# Patient Record
Sex: Female | Born: 1935 | Race: Black or African American | Hispanic: No | Smoking: Former smoker
Health system: Southern US, Community
[De-identification: ages and names within clinical notes are randomized; demographics above are authoritative.]

## PROBLEM LIST (undated history)

## (undated) DIAGNOSIS — T7840XA Allergy, unspecified, initial encounter: Secondary | ICD-10-CM

## (undated) DIAGNOSIS — R001 Bradycardia, unspecified: Secondary | ICD-10-CM

## (undated) DIAGNOSIS — R55 Syncope and collapse: Secondary | ICD-10-CM

## (undated) DIAGNOSIS — N183 Chronic kidney disease, stage 3 unspecified: Secondary | ICD-10-CM

## (undated) DIAGNOSIS — I671 Cerebral aneurysm, nonruptured: Secondary | ICD-10-CM

## (undated) DIAGNOSIS — M329 Systemic lupus erythematosus, unspecified: Secondary | ICD-10-CM

## (undated) DIAGNOSIS — F17200 Nicotine dependence, unspecified, uncomplicated: Secondary | ICD-10-CM

## (undated) DIAGNOSIS — I129 Hypertensive chronic kidney disease with stage 1 through stage 4 chronic kidney disease, or unspecified chronic kidney disease: Secondary | ICD-10-CM

## (undated) DIAGNOSIS — M1990 Unspecified osteoarthritis, unspecified site: Secondary | ICD-10-CM

## (undated) DIAGNOSIS — M19049 Primary osteoarthritis, unspecified hand: Secondary | ICD-10-CM

## (undated) DIAGNOSIS — H9190 Unspecified hearing loss, unspecified ear: Secondary | ICD-10-CM

## (undated) DIAGNOSIS — I10 Essential (primary) hypertension: Secondary | ICD-10-CM

## (undated) DIAGNOSIS — E538 Deficiency of other specified B group vitamins: Secondary | ICD-10-CM

## (undated) DIAGNOSIS — E039 Hypothyroidism, unspecified: Secondary | ICD-10-CM

## (undated) DIAGNOSIS — F0390 Unspecified dementia without behavioral disturbance: Secondary | ICD-10-CM

## (undated) DIAGNOSIS — G250 Essential tremor: Secondary | ICD-10-CM

## (undated) DIAGNOSIS — I639 Cerebral infarction, unspecified: Secondary | ICD-10-CM

## (undated) DIAGNOSIS — R809 Proteinuria, unspecified: Secondary | ICD-10-CM

## (undated) DIAGNOSIS — I472 Ventricular tachycardia: Secondary | ICD-10-CM

## (undated) DIAGNOSIS — I4729 Other ventricular tachycardia: Secondary | ICD-10-CM

## (undated) DIAGNOSIS — IMO0002 Reserved for concepts with insufficient information to code with codable children: Secondary | ICD-10-CM

## (undated) DIAGNOSIS — I2510 Atherosclerotic heart disease of native coronary artery without angina pectoris: Secondary | ICD-10-CM

## (undated) DIAGNOSIS — I4721 Torsades de pointes: Secondary | ICD-10-CM

## (undated) DIAGNOSIS — E785 Hyperlipidemia, unspecified: Secondary | ICD-10-CM

## (undated) DIAGNOSIS — N318 Other neuromuscular dysfunction of bladder: Secondary | ICD-10-CM

## (undated) DIAGNOSIS — Z8679 Personal history of other diseases of the circulatory system: Secondary | ICD-10-CM

## (undated) DIAGNOSIS — M8580 Other specified disorders of bone density and structure, unspecified site: Secondary | ICD-10-CM

## (undated) DIAGNOSIS — M19019 Primary osteoarthritis, unspecified shoulder: Secondary | ICD-10-CM

## (undated) HISTORY — DX: Systemic lupus erythematosus, unspecified: M32.9

## (undated) HISTORY — DX: Bradycardia, unspecified: R00.1

## (undated) HISTORY — DX: Hypothyroidism, unspecified: E03.9

## (undated) HISTORY — DX: Personal history of other diseases of the circulatory system: Z86.79

## (undated) HISTORY — DX: Proteinuria, unspecified: R80.9

## (undated) HISTORY — DX: Primary osteoarthritis, unspecified hand: M19.049

## (undated) HISTORY — DX: Allergy, unspecified, initial encounter: T78.40XA

## (undated) HISTORY — DX: Deficiency of other specified B group vitamins: E53.8

## (undated) HISTORY — DX: Unspecified dementia, unspecified severity, without behavioral disturbance, psychotic disturbance, mood disturbance, and anxiety: F03.90

## (undated) HISTORY — DX: Nicotine dependence, unspecified, uncomplicated: F17.200

## (undated) HISTORY — DX: Other neuromuscular dysfunction of bladder: N31.8

## (undated) HISTORY — DX: Chronic kidney disease, stage 3 (moderate): N18.3

## (undated) HISTORY — DX: Essential (primary) hypertension: I10

## (undated) HISTORY — DX: Other ventricular tachycardia: I47.29

## (undated) HISTORY — DX: Cerebral infarction, unspecified: I63.9

## (undated) HISTORY — DX: Syncope and collapse: R55

## (undated) HISTORY — DX: Essential tremor: G25.0

## (undated) HISTORY — PX: OTHER SURGICAL HISTORY: SHX169

## (undated) HISTORY — DX: Primary osteoarthritis, unspecified shoulder: M19.019

## (undated) HISTORY — DX: Ventricular tachycardia: I47.2

## (undated) HISTORY — DX: Chronic kidney disease, stage 3 unspecified: N18.30

## (undated) HISTORY — DX: Torsades de pointes: I47.21

## (undated) HISTORY — DX: Other specified disorders of bone density and structure, unspecified site: M85.80

## (undated) HISTORY — DX: Unspecified osteoarthritis, unspecified site: M19.90

## (undated) HISTORY — DX: Atherosclerotic heart disease of native coronary artery without angina pectoris: I25.10

## (undated) HISTORY — DX: Reserved for concepts with insufficient information to code with codable children: IMO0002

## (undated) HISTORY — DX: Hyperlipidemia, unspecified: E78.5

## (undated) HISTORY — DX: Unspecified hearing loss, unspecified ear: H91.90

## (undated) HISTORY — DX: Cerebral aneurysm, nonruptured: I67.1

## (undated) HISTORY — DX: Hypertensive chronic kidney disease with stage 1 through stage 4 chronic kidney disease, or unspecified chronic kidney disease: I12.9

---

## 1999-06-10 ENCOUNTER — Emergency Department (HOSPITAL_COMMUNITY): Admission: EM | Admit: 1999-06-10 | Discharge: 1999-06-10 | Payer: Self-pay | Admitting: Emergency Medicine

## 1999-06-24 ENCOUNTER — Ambulatory Visit (HOSPITAL_COMMUNITY): Admission: RE | Admit: 1999-06-24 | Discharge: 1999-06-24 | Payer: Self-pay | Admitting: Internal Medicine

## 1999-06-24 ENCOUNTER — Encounter: Payer: Self-pay | Admitting: Internal Medicine

## 1999-10-22 ENCOUNTER — Encounter: Payer: Self-pay | Admitting: Internal Medicine

## 1999-10-22 ENCOUNTER — Ambulatory Visit (HOSPITAL_COMMUNITY): Admission: RE | Admit: 1999-10-22 | Discharge: 1999-10-22 | Payer: Self-pay | Admitting: Internal Medicine

## 2004-06-05 ENCOUNTER — Ambulatory Visit: Payer: Self-pay | Admitting: *Deleted

## 2004-08-25 ENCOUNTER — Ambulatory Visit: Payer: Self-pay | Admitting: Nurse Practitioner

## 2005-07-30 DIAGNOSIS — N318 Other neuromuscular dysfunction of bladder: Secondary | ICD-10-CM | POA: Insufficient documentation

## 2006-04-12 ENCOUNTER — Encounter: Payer: Self-pay | Admitting: Internal Medicine

## 2006-04-12 LAB — CONVERTED CEMR LAB: TSH: 3.756 microintl units/mL

## 2007-03-16 ENCOUNTER — Encounter: Payer: Self-pay | Admitting: Internal Medicine

## 2007-03-16 LAB — CONVERTED CEMR LAB
BUN: 19 mg/dL
Chloride: 105 meq/L
Creatinine, Ser: 1.48 mg/dL
Hemoglobin: 11.7 g/dL
Potassium: 3.9 meq/L
Sodium: 142 meq/L
TSH: 67.068 microintl units/mL
WBC: 5.2 10*3/uL

## 2007-04-19 DIAGNOSIS — H9190 Unspecified hearing loss, unspecified ear: Secondary | ICD-10-CM

## 2007-12-19 ENCOUNTER — Encounter: Payer: Self-pay | Admitting: Internal Medicine

## 2007-12-19 LAB — CONVERTED CEMR LAB
Alkaline Phosphatase: 70 units/L
CO2: 21 meq/L
Chloride: 105 meq/L
Creatinine, Ser: 1.12 mg/dL
HCT: 39.7 %
Hemoglobin: 13 g/dL
RDW: 14.9 %
Sodium: 140 meq/L
TSH: 0.085 microintl units/mL
WBC: 5.4 10*3/uL

## 2008-01-20 DIAGNOSIS — M19049 Primary osteoarthritis, unspecified hand: Secondary | ICD-10-CM | POA: Insufficient documentation

## 2008-05-01 ENCOUNTER — Encounter: Payer: Self-pay | Admitting: Internal Medicine

## 2008-05-01 DIAGNOSIS — R259 Unspecified abnormal involuntary movements: Secondary | ICD-10-CM | POA: Insufficient documentation

## 2008-05-01 LAB — CONVERTED CEMR LAB: TSH: 0.041 microintl units/mL

## 2008-10-12 ENCOUNTER — Encounter: Payer: Self-pay | Admitting: Internal Medicine

## 2008-10-12 LAB — CONVERTED CEMR LAB
AST: 18 units/L
Alkaline Phosphatase: 77 units/L
Calcium: 9.8 mg/dL
Glucose, Bld: 79 mg/dL
MCV: 95 fL
Platelets: 291 10*3/uL
RDW: 14.9 %
Sodium: 140 meq/L
TSH: 0.426 microintl units/mL
WBC: 5.6 10*3/uL

## 2009-03-22 ENCOUNTER — Emergency Department (HOSPITAL_COMMUNITY): Admission: EM | Admit: 2009-03-22 | Discharge: 2009-03-22 | Payer: Self-pay | Admitting: Family Medicine

## 2009-08-05 ENCOUNTER — Ambulatory Visit: Payer: Self-pay | Admitting: Internal Medicine

## 2009-08-05 DIAGNOSIS — R0602 Shortness of breath: Secondary | ICD-10-CM | POA: Insufficient documentation

## 2009-08-05 DIAGNOSIS — I10 Essential (primary) hypertension: Secondary | ICD-10-CM

## 2009-08-05 DIAGNOSIS — E039 Hypothyroidism, unspecified: Secondary | ICD-10-CM

## 2009-08-05 DIAGNOSIS — M19019 Primary osteoarthritis, unspecified shoulder: Secondary | ICD-10-CM | POA: Insufficient documentation

## 2009-08-05 DIAGNOSIS — Z8679 Personal history of other diseases of the circulatory system: Secondary | ICD-10-CM | POA: Insufficient documentation

## 2009-08-05 DIAGNOSIS — F17200 Nicotine dependence, unspecified, uncomplicated: Secondary | ICD-10-CM | POA: Insufficient documentation

## 2009-08-07 ENCOUNTER — Encounter: Admission: RE | Admit: 2009-08-07 | Discharge: 2009-08-07 | Payer: Self-pay | Admitting: Internal Medicine

## 2009-08-07 LAB — CONVERTED CEMR LAB
BUN: 16 mg/dL (ref 6–23)
Basophils Absolute: 0 10*3/uL (ref 0.0–0.1)
Basophils Relative: 0.3 % (ref 0.0–3.0)
CO2: 26 meq/L (ref 19–32)
Calcium: 9.8 mg/dL (ref 8.4–10.5)
Chloride: 105 meq/L (ref 96–112)
Cholesterol: 267 mg/dL — ABNORMAL HIGH (ref 0–200)
Creatinine, Ser: 1.7 mg/dL — ABNORMAL HIGH (ref 0.4–1.2)
Direct LDL: 178.7 mg/dL
Eosinophils Absolute: 0.1 10*3/uL (ref 0.0–0.7)
Eosinophils Relative: 2.4 % (ref 0.0–5.0)
GFR calc non Af Amer: 38.08 mL/min (ref >60)
Glucose, Bld: 90 mg/dL (ref 70–99)
HCT: 36.1 % (ref 36.0–46.0)
HDL: 50.7 mg/dL (ref >39.00)
Hemoglobin: 12.3 g/dL (ref 12.0–15.0)
Lymphocytes Relative: 30.1 % (ref 12.0–46.0)
Lymphs Abs: 1.7 10*3/uL (ref 0.7–4.0)
MCHC: 34.1 g/dL (ref 30.0–36.0)
MCV: 100.4 fL — ABNORMAL HIGH (ref 78.0–100.0)
Monocytes Absolute: 0.4 10*3/uL (ref 0.1–1.0)
Monocytes Relative: 6.3 % (ref 3.0–12.0)
Neutro Abs: 3.4 10*3/uL (ref 1.4–7.7)
Neutrophils Relative %: 60.9 % (ref 43.0–77.0)
Platelets: 252 10*3/uL (ref 150.0–400.0)
Potassium: 5.1 meq/L (ref 3.5–5.1)
RBC: 3.6 M/uL — ABNORMAL LOW (ref 3.87–5.11)
RDW: 14.9 % — ABNORMAL HIGH (ref 11.5–14.6)
Sodium: 141 meq/L (ref 135–145)
TSH: 48.81 microintl units/mL — ABNORMAL HIGH (ref 0.35–5.50)
Total CHOL/HDL Ratio: 5
Triglycerides: 128 mg/dL (ref 0.0–149.0)
VLDL: 25.6 mg/dL (ref 0.0–40.0)
WBC: 5.6 10*3/uL (ref 4.5–10.5)

## 2009-08-13 ENCOUNTER — Encounter: Payer: Self-pay | Admitting: Internal Medicine

## 2009-08-16 ENCOUNTER — Encounter (INDEPENDENT_AMBULATORY_CARE_PROVIDER_SITE_OTHER): Payer: Self-pay | Admitting: *Deleted

## 2009-08-16 ENCOUNTER — Encounter: Admission: RE | Admit: 2009-08-16 | Discharge: 2009-08-16 | Payer: Self-pay | Admitting: Internal Medicine

## 2009-08-26 ENCOUNTER — Encounter: Payer: Self-pay | Admitting: Internal Medicine

## 2009-08-26 DIAGNOSIS — M25569 Pain in unspecified knee: Secondary | ICD-10-CM

## 2009-08-26 DIAGNOSIS — E785 Hyperlipidemia, unspecified: Secondary | ICD-10-CM

## 2009-09-10 ENCOUNTER — Ambulatory Visit: Payer: Self-pay | Admitting: Internal Medicine

## 2009-09-11 LAB — CONVERTED CEMR LAB
ALT: 12 units/L (ref 0–35)
AST: 19 units/L (ref 0–37)
Alkaline Phosphatase: 70 units/L (ref 39–117)
Bilirubin, Direct: 0.1 mg/dL (ref 0.0–0.3)
Cholesterol: 189 mg/dL (ref 0–200)
Total Protein: 8.3 g/dL (ref 6.0–8.3)

## 2010-01-27 ENCOUNTER — Encounter: Payer: Self-pay | Admitting: Internal Medicine

## 2010-02-25 ENCOUNTER — Ambulatory Visit: Payer: Self-pay | Admitting: Internal Medicine

## 2010-02-25 DIAGNOSIS — L408 Other psoriasis: Secondary | ICD-10-CM | POA: Insufficient documentation

## 2010-02-25 LAB — CONVERTED CEMR LAB
Creatinine, Ser: 1.4 mg/dL — ABNORMAL HIGH (ref 0.4–1.2)
LDL Cholesterol: 105 mg/dL — ABNORMAL HIGH (ref 0–99)
Total CHOL/HDL Ratio: 5

## 2010-05-19 ENCOUNTER — Telehealth: Payer: Self-pay | Admitting: Internal Medicine

## 2010-11-04 NOTE — Assessment & Plan Note (Signed)
Summary: 3-6 mth fu  stc   Vital Signs:  Patient profile:   75 year old female Height:      65 inches (165.10 cm) Weight:      191.0 pounds (86.82 kg) O2 Sat:      96 % on Room air Temp:     98.1 degrees F (36.72 degrees C) oral Pulse rate:   70 / minute BP sitting:   142 / 68  (left arm) Cuff size:   regular  Vitals Entered By: Orlan Leavens (Feb 25, 2010 1:16 PM)  O2 Flow:  Room air CC: 6 month follow-up Is Patient Diabetic? No Pain Assessment Patient in pain? no        Primary Care Provider:  Newt Lukes MD  CC:  6 month follow-up.  History of Present Illness: here for followup -  1) dyslipidemia -  now on statin since fall 2010 no adv SE - reports 100% compiance with meds - dtr reports poor pt compliance with diet "eats only friend chicken!"  2) hypothyroid - dose med inc 6 weeks ago - denies adverse side effects related to current therapy.   3) arthiritis - reports compliance with ongoing medical treatment and no changes in medication dose or frequency. denies adverse side effects related to current therapy.   4) HTN -  reports compliance with ongoing medical treatment and no changes in medication dose or frequency. denies adverse side effects related to current therapy. no CP, edema or SOB  Clinical Review Panels:  Lipid Management   Cholesterol:  189 (09/10/2009)   LDL (bad choesterol):  135 (09/10/2009)   HDL (good cholesterol):  29.10 (09/10/2009)  CBC   WBC:  5.6 (08/05/2009)   RBC:  3.60 (08/05/2009)   Hgb:  12.3 (08/05/2009)   Hct:  36.1 (08/05/2009)   Platelets:  252.0 (08/05/2009)   MCV  100.4 (08/05/2009)   MCHC  34.1 (08/05/2009)   RDW  14.9 (08/05/2009)   PMN:  60.9 (08/05/2009)   Lymphs:  30.1 (08/05/2009)   Monos:  6.3 (08/05/2009)   Eosinophils:  2.4 (08/05/2009)   Basophil:  0.3 (08/05/2009)  Complete Metabolic Panel   Glucose:  90 (08/05/2009)   Sodium:  141 (08/05/2009)   Potassium:  5.1 (08/05/2009)   Chloride:   105 (08/05/2009)   CO2:  26 (08/05/2009)   BUN:  16 (08/05/2009)   Creatinine:  1.7 (08/05/2009)   Albumin:  3.7 (09/10/2009)   Total Protein:  8.3 (09/10/2009)   Calcium:  9.8 (08/05/2009)   Total Bili:  0.6 (09/10/2009)   Alk Phos:  70 (09/10/2009)   SGPT (ALT):  12 (09/10/2009)   SGOT (AST):  19 (09/10/2009)   Current Medications (verified): 1)  Levothyroxine Sodium 125 Mcg Tabs (Levothyroxine Sodium) .Marland Kitchen.. 1 By Mouth Once Daily 2)  Tizanidine Hcl 2 Mg Tabs (Tizanidine Hcl) .... Take 1 Three Times A Day Prn 3)  Benicar Hct 40-12.5 Mg Tabs (Olmesartan Medoxomil-Hctz) .... Take 1 By Mouth Qd 4)  Meloxicam 7.5 Mg Tabs (Meloxicam) .Marland Kitchen.. 1 By Mouth Once Daily As Needed For Shoulder Arthritis Pain 5)  Zocor 20 Mg Tabs (Simvastatin) .Marland Kitchen.. 1 By Mouth At Bedtime  Allergies (verified): No Known Drug Allergies  Past History:  Past Medical History: Hypertension Hypothyroidism Cerebrovascular accident, hx of osteoarthritis Hyperlipidemia  Family History: Family History of Arthritis (parents) Family History Hypertension (parent & other blood relative) Family History Kidney disease (parent) Heart disease (parent) Gout (father) 7 sibling with hypertension,diabetes, and thyroid disease  Social History: Current Smoker lives with dtr, widowed works at [employer] and [employer] ([employer]) x 26y no alcohol use    Review of Systems  The patient denies anorexia, weight loss, chest pain, syncope, and dyspnea on exertion.         c/o trouble with shaking R hand with writing - hx same prev elav by neuro "nothing wrong" but its getting worse.  Physical Exam  General:  alert, well-developed, well-nourished, and cooperative to examination.   - dtr at side Lungs:  normal respiratory effort, no intercostal retractions or use of accessory muscles; normal breath sounds bilaterally - no crackles and no wheezes.    Heart:  normal rate, regular rhythm, no murmur, and no rub. BLE without  edema. Skin:  thickend scalp macules - no acute inflammation -= (?hx skin lupus per dtr) Psych:  Oriented X3, memory intact for recent and remote, normally interactive, good eye contact, not anxious appearing, not depressed appearing, and not agitated.      Impression & Recommendations:  Problem # 1:  TREMOR (ICD-781.0) hx same R hand - dtr will let us know where we can get records on the prior w/u and eval oif this -  no abn on exam today  Problem # 2:  HYPERLIPIDEMIA (ICD-272.4)  Her updated medication list for this problem includes:    Zocor 20 Mg Tabs (Simvastatin) .Marland Kitchen... 1 by mouth at bedtime  Labs Reviewed: SGOT: 19 (09/10/2009)   SGPT: 12 (09/10/2009)   HDL:29.10 (09/10/2009), 50.70 (08/05/2009)  LDL:135 (09/10/2009)  Chol:189 (09/10/2009), 267 (08/05/2009)  Trig:127.0 (09/10/2009), 128.0 (08/05/2009)  Orders: TLB-Lipid Panel (80061-LIPID)  Problem # 3:  HYPOTHYROIDISM (ICD-244.9)  Her updated medication list for this problem includes:    Levothyroxine Sodium 125 Mcg Tabs (Levothyroxine sodium) .Marland Kitchen... 1 by mouth once daily  Labs Reviewed: TSH: 0.91 (09/10/2009)    Chol: 189 (09/10/2009)   HDL: 29.10 (09/10/2009)   LDL: 135 (09/10/2009)   TG: 127.0 (09/10/2009)  Orders: TLB-TSH (Thyroid Stimulating Hormone) (84443-TSH)  Problem # 4:  HYPERTENSION (ICD-401.9)  Her updated medication list for this problem includes:    Benicar Hct 40-12.5 Mg Tabs (Olmesartan medoxomil-hctz) .Marland Kitchen... Take 1 by mouth qd  BP today: 142/68 Prior BP: 130/82 (09/10/2009)  Labs Reviewed: K+: 5.1 (08/05/2009) Creat: : 1.7 (08/05/2009)   Chol: 189 (09/10/2009)   HDL: 29.10 (09/10/2009)   LDL: 135 (09/10/2009)   TG: 127.0 (09/10/2009)  Orders: TLB-Creatinine, Blood (82565-CREA)  Problem # 5:  OTHER PSORIASIS AND SIMILAR DISORDERS (ICD-696.1)  ?lupus vs. psoriasis -  will refer to derm for eval - requests drew jones  Orders: Dermatology Referral (Derma)  Complete Medication List: 1)   Levothyroxine Sodium 125 Mcg Tabs (Levothyroxine sodium) .Marland Kitchen.. 1 by mouth once daily 2)  Tizanidine Hcl 2 Mg Tabs (Tizanidine hcl) .... Take 1 three times a day prn 3)  Benicar Hct 40-12.5 Mg Tabs (Olmesartan medoxomil-hctz) .... Take 1 by mouth qd 4)  Meloxicam 7.5 Mg Tabs (Meloxicam) .Marland Kitchen.. 1 by mouth once daily as needed for shoulder arthritis pain 5)  Zocor 20 Mg Tabs (Simvastatin) .Marland Kitchen.. 1 by mouth at bedtime  Patient Instructions: 1)  it was good to see you today.  2)  test(s) ordered today - your results will be posted on the phone tree for review in 48-72 hours from the time of test completion; if any changes need to be made or there are abnormal results, you will be contacted directly.  3)  otherwise, no medication changes - keep  doing what you are doing! refills done today 4)  we'll make referral to Dermatology (dr. drew jones requested) for your scalp rash. Our office will contact you regarding this appointment once made.  5)  Please schedule a follow-up appointment in 3-6 months, sooner if problems.  Prescriptions: ZOCOR 20 MG TABS (SIMVASTATIN) 1 by mouth at bedtime  #30 x 5   Entered by:   Orlan Leavens   Authorized by:   Newt Lukes MD   Signed by:   Orlan Leavens on 02/25/2010   Method used:   [facility]        [facility] [address] [phone]* (retail)       [address]       [place], [place]  [address]       Ph: [phone] or [phone]       Fax: [phone]   RxID:   [id number] MELOXICAM 7.5 MG TABS (MELOXICAM) 1 by mouth once daily as needed for shoulder arthritis pain  #30 Tablet x 1   Entered by:   Orlan Leavens   Authorized by:   Newt Lukes MD   Signed by:   Orlan Leavens on 02/25/2010   Method used:   [facility]        [facility] [address] [phone]* (retail)       [address]       [place], [place]  [address]       Ph: [phone] or [phone]       Fax: [phone]   RxID:    [id number] LEVOTHYROXINE SODIUM 125 MCG TABS (LEVOTHYROXINE SODIUM) 1 by mouth once daily  #30 Tablet x 5   Entered by:   Orlan Leavens   Authorized by:   Newt Lukes MD   Signed by:   Orlan Leavens on 02/25/2010   Method used:   [facility] to        [facility] [address] [phone]* (retail)       [address]       [place], [place]  [address]       Ph: [phone] or [phone]       Fax: [phone]   RxID:   [id number]

## 2010-11-04 NOTE — Therapy (Signed)
Summary: [facility]   [facility]   Imported By: Lester  02/03/2010 10:46:42  _____________________________________________________________________  External Attachment:    Type:   Image     Comment:   External Document

## 2010-11-04 NOTE — Progress Notes (Signed)
Summary: benicar  Phone Note Refill Request Message from:  Fax from Pharmacy on May 19, 2010 10:11 AM  Refills Requested: Medication #1:  BENICAR HCT 40-12.5 MG TABS take 1 by mouth qd   [facility]   Method Requested: Electronic Initial call taken by: Orlan Leavens RMA,  May 19, 2010 10:11 AM    Prescriptions: BENICAR HCT 40-12.5 MG TABS (OLMESARTAN MEDOXOMIL-HCTZ) take 1 by mouth qd  #30 x 4   Entered by:   Orlan Leavens RMA   Authorized by:   Newt Lukes MD   Signed by:   Orlan Leavens RMA on 05/19/2010   Method used:   [facility]ronically to        [facility] [address] [phone]* (retail)       [address]       [place], [place]  [address]       Ph: [phone] or [phone]       Fax: [phone]   RxID:   [id number]

## 2010-12-17 ENCOUNTER — Telehealth: Payer: Self-pay | Admitting: Internal Medicine

## 2010-12-23 NOTE — Progress Notes (Signed)
Summary: benicar  Phone Note Refill Request Message from:  Fax from Pharmacy on December 17, 2010 1:04 PM  Refills Requested: Medication #1:  BENICAR HCT 40-12.5 MG TABS take 1 by mouth qd   Last Refilled: 10/24/2010  Method Requested: Electronic Initial call taken by: Orlan Leavens RMA,  December 17, 2010 1:04 PM    Prescriptions: BENICAR HCT 40-12.5 MG TABS (OLMESARTAN MEDOXOMIL-HCTZ) take 1 by mouth qd  #30 x 2   Entered by:   Orlan Leavens RMA   Authorized by:   Newt Lukes MD   Signed by:   Orlan Leavens RMA on 12/17/2010   Method used:   [facility]ronically to        [facility] [address] [phone]* (retail)       [address]       [place], [place]  [address]       Ph: [phone] or [phone]       Fax: [phone]   RxID:   [id number]

## 2011-04-01 ENCOUNTER — Other Ambulatory Visit: Payer: Self-pay | Admitting: Internal Medicine

## 2011-04-14 ENCOUNTER — Encounter: Payer: Self-pay | Admitting: Internal Medicine

## 2011-04-23 ENCOUNTER — Encounter: Payer: Self-pay | Admitting: Internal Medicine

## 2011-04-23 ENCOUNTER — Other Ambulatory Visit (INDEPENDENT_AMBULATORY_CARE_PROVIDER_SITE_OTHER): Payer: Medicare Other

## 2011-04-23 ENCOUNTER — Other Ambulatory Visit: Payer: Self-pay | Admitting: Internal Medicine

## 2011-04-23 ENCOUNTER — Ambulatory Visit (INDEPENDENT_AMBULATORY_CARE_PROVIDER_SITE_OTHER): Payer: Medicare Other | Admitting: Internal Medicine

## 2011-04-23 DIAGNOSIS — E785 Hyperlipidemia, unspecified: Secondary | ICD-10-CM

## 2011-04-23 DIAGNOSIS — E039 Hypothyroidism, unspecified: Secondary | ICD-10-CM

## 2011-04-23 DIAGNOSIS — Z23 Encounter for immunization: Secondary | ICD-10-CM

## 2011-04-23 DIAGNOSIS — I10 Essential (primary) hypertension: Secondary | ICD-10-CM

## 2011-04-23 DIAGNOSIS — Z79899 Other long term (current) drug therapy: Secondary | ICD-10-CM

## 2011-04-23 DIAGNOSIS — L408 Other psoriasis: Secondary | ICD-10-CM

## 2011-04-23 LAB — HEPATIC FUNCTION PANEL
ALT: 10 U/L (ref 0–35)
AST: 25 U/L (ref 0–37)
Albumin: 4 g/dL (ref 3.5–5.2)

## 2011-04-23 LAB — LIPID PANEL
Cholesterol: 208 mg/dL — ABNORMAL HIGH (ref 0–200)
Total CHOL/HDL Ratio: 4
Triglycerides: 76 mg/dL (ref 0.0–149.0)

## 2011-04-23 LAB — TSH: TSH: 29.47 u[IU]/mL — ABNORMAL HIGH (ref 0.35–5.50)

## 2011-04-23 LAB — BASIC METABOLIC PANEL
Calcium: 9.7 mg/dL (ref 8.4–10.5)
GFR: 41.24 mL/min — ABNORMAL LOW (ref >60.00)
Sodium: 144 mEq/L (ref 135–145)

## 2011-04-23 LAB — LDL CHOLESTEROL, DIRECT: Direct LDL: 138.6 mg/dL

## 2011-04-23 MED ORDER — TRIAMCINOLONE ACETONIDE 0.1 % EX LOTN: TOPICAL_LOTION | CUTANEOUS | Status: DC

## 2011-04-23 MED ORDER — PROPRANOLOL HCL ER 80 MG PO CP24: 80.0000 mg | ORAL_CAPSULE | Freq: Every day | ORAL | Status: DC

## 2011-04-23 MED ORDER — PNEUMOCOCCAL VAC POLYVALENT 25 MCG/0.5ML IJ INJ: 0.5000 mL | INJECTION | Freq: Once | INTRAMUSCULAR | Status: DC

## 2011-04-23 NOTE — Assessment & Plan Note (Signed)
S/p derm eval for same in past - rx steroid lotion to use prn

## 2011-04-23 NOTE — Assessment & Plan Note (Signed)
On statin - check LFT/lipids now

## 2011-04-23 NOTE — Patient Instructions (Addendum)
It was good to see you today. We have reviewed your prior records including labs and tests today Test(s) ordered today. Your results will be called to you after review (48-72hours after test completion). If any changes need to be made, you will be notified at that time. Start Inderal in addition to other medications to help control blood pressure and tremor symptoms  - no other changes at this time Your prescription(s) have been submitted to your pharmacy. Please take as directed and contact our office if you believe you are having problem(s) with the medication(s). Please schedule followup in 6 months to monitor thyroid and blood pressure, call sooner if problems. Pneumonia shot done today

## 2011-04-23 NOTE — Assessment & Plan Note (Signed)
BP Readings from Last 3 Encounters:  04/23/11 140/90  02/25/10 142/68  09/10/09 130/82   Add Bbloc to current therapy for BP control and RUE tremor symptoms  Check labs to monitor Cr

## 2011-04-23 NOTE — Progress Notes (Signed)
  Subjective:    Patient ID: Erika Rivera, female    DOB: 01-06-1936, 74 y.o.   MRN: [id number]  HPI here for followup - reviewed chronic medical issues:  dyslipidemia - on statin since fall 2010  no adv SE - reports 100% compiance with meds -  dtr reports poor pt compliance with diet  hypothyroid - last dose change 01/2011 -  denies adverse side effects related to current therapy.   arthiritis - reports compliance with ongoing medical treatment and no changes in medication dose or frequency.  denies adverse side effects related to current therapy.   HTN - reports compliance with ongoing medical treatment and no changes in medication dose or frequency.  denies adverse side effects related to current therapy. no CP, edema or SOB   Past Medical History  Diagnosis Date  . ARTHRITIS, HAND   . ARTHRITIS, LEFT SHOULDER   . HEARING LOSS   . SMOKER   . CEREBROVASCULAR ACCIDENT, HX OF   . HYPERTENSION   . HYPERLIPIDEMIA   . HYPOTHYROIDISM   . OVERACTIVE BLADDER     Review of Systems  Respiratory: Negative for shortness of breath.   Cardiovascular: Negative for chest pain.  Neurological: Negative for headaches.  increase RUE tremor     Objective:   Physical Exam BP 140/90  Pulse 65  Temp(Src) 97.9 F (36.6 C) (Oral)  Ht 5\' 5"  (1.651 m)  Wt 180 lb (81.647 kg)  BMI 29.95 kg/m2  SpO2 99% Wt Readings from Last 3 Encounters:  04/23/11 180 lb (81.647 kg)  02/25/10 191 lb (86.637 kg)  09/10/09 195 lb 6.4 oz (88.633 kg)    Physical Exam  Constitutional: She is oriented to person, place, and time. She appears well-developed and well-nourished. No distress. dtr at side Neck: Normal range of motion. Neck supple. No JVD present. No thyromegaly present.  Cardiovascular: Normal rate, regular rhythm and normal heart sounds.  No murmur heard. No BLE edema. Pulmonary/Chest: Effort normal and breath sounds normal. No respiratory distress. She has no wheezes.  Psychiatric: She has a  normal mood and affect. Her behavior is normal. Judgment and thought content normal.  Neuro: mild RUE intention tremor Skin - small psoriasis like patch along back at bra line  Lab Results  Component Value Date   WBC 5.6 08/05/2009   HGB 12.3 08/05/2009   HCT 36.1 08/05/2009   PLT 252.0 08/05/2009   CHOL 181 02/25/2010   TRIG 194.0* 02/25/2010   HDL 37.70* 02/25/2010   LDLDIRECT 178.7 08/05/2009   ALT 12 09/10/2009   AST 19 09/10/2009   NA 141 08/05/2009   K 5.1 08/05/2009   CL 105 08/05/2009   CREATININE 1.4* 02/25/2010   BUN 16 08/05/2009   CO2 26 08/05/2009   TSH 0.12* 02/25/2010        Assessment & Plan:  See problem list. Medications and labs reviewed today.

## 2011-04-23 NOTE — Assessment & Plan Note (Signed)
Last dose increase 01/2011 - now increase tremor and mild weight loss Check tsh now - adjust as needed

## 2011-04-24 ENCOUNTER — Other Ambulatory Visit: Payer: Self-pay | Admitting: *Deleted

## 2011-04-24 MED ORDER — LEVOTHYROXINE SODIUM 112 MCG PO TABS: 112.0000 ug | ORAL_TABLET | Freq: Every day | ORAL | Status: DC

## 2011-04-24 NOTE — Telephone Encounter (Signed)
Notified pt daughter Cherly Hensen) gave response with increase synthroid. rx sent to pharmacy...04/24/11@1 :54pm/LMB

## 2011-04-24 NOTE — Telephone Encounter (Signed)
Ok - yes, needs to increase dose synthroid - new erx done for tabs - take 1 daily - thanks

## 2011-04-24 NOTE — Telephone Encounter (Signed)
Called pt concerning her labs results. Daughter states mom was taking thyroid med everyday. Want to know if md want to change dosage on her med since it was abnormal..Marland Kitchen7/20/12@10 :24am/LMB

## 2011-05-01 ENCOUNTER — Other Ambulatory Visit: Payer: Self-pay | Admitting: Internal Medicine

## 2011-05-18 ENCOUNTER — Other Ambulatory Visit: Payer: Self-pay | Admitting: Internal Medicine

## 2011-07-29 ENCOUNTER — Encounter: Payer: Self-pay | Admitting: Internal Medicine

## 2011-07-29 ENCOUNTER — Ambulatory Visit (INDEPENDENT_AMBULATORY_CARE_PROVIDER_SITE_OTHER): Payer: Medicare Other | Admitting: Internal Medicine

## 2011-07-29 ENCOUNTER — Other Ambulatory Visit: Payer: Self-pay | Admitting: Internal Medicine

## 2011-07-29 ENCOUNTER — Other Ambulatory Visit (INDEPENDENT_AMBULATORY_CARE_PROVIDER_SITE_OTHER): Payer: Medicare Other

## 2011-07-29 DIAGNOSIS — E039 Hypothyroidism, unspecified: Secondary | ICD-10-CM

## 2011-07-29 DIAGNOSIS — I10 Essential (primary) hypertension: Secondary | ICD-10-CM

## 2011-07-29 DIAGNOSIS — E785 Hyperlipidemia, unspecified: Secondary | ICD-10-CM

## 2011-07-29 DIAGNOSIS — L602 Onychogryphosis: Secondary | ICD-10-CM

## 2011-07-29 DIAGNOSIS — Z23 Encounter for immunization: Secondary | ICD-10-CM

## 2011-07-29 DIAGNOSIS — L608 Other nail disorders: Secondary | ICD-10-CM

## 2011-07-29 LAB — LIPID PANEL
Cholesterol: 183 mg/dL (ref 0–200)
HDL: 52.6 mg/dL (ref >39.00)
LDL Cholesterol: 116 mg/dL — ABNORMAL HIGH (ref 0–99)
Total CHOL/HDL Ratio: 3
Triglycerides: 72 mg/dL (ref 0.0–149.0)

## 2011-07-29 NOTE — Assessment & Plan Note (Signed)
BP Readings from Last 3 Encounters:  07/29/11 166/82  04/23/11 140/90  02/25/10 142/68   Added Bbloc to ARB/hct 04/2011 for BP control and RUE tremor symptoms  Variable compliance (none last few days) -  Encouraged compliance rather than prescribing med change at this time

## 2011-07-29 NOTE — Patient Instructions (Signed)
It was good to see you today. Test(s) ordered today. Your results will be called to you after review (48-72hours after test completion). If any changes need to be made, you will be notified at that time. Medications reviewed, no changes at this time.  we'll make referral to foot specialist for your toenail. Our office will contact you regarding appointment(s) once made.  Please schedule followup in 6 months to monitor thyroid and blood pressure, call sooner if problems. Flu shot done today

## 2011-07-29 NOTE — Assessment & Plan Note (Signed)
Hx CVA - but Noncompliance with statin summer 2012 recheck lipids now Lipid Panel     Component Value Date/Time   CHOL 208* 04/23/2011 0958   TRIG 76.0 04/23/2011 0958   HDL 54.50 04/23/2011 0958   CHOLHDL 4 04/23/2011 0958   VLDL 15.2 04/23/2011 0958   LDLCALC 105* 02/25/2010 1346

## 2011-07-29 NOTE — Progress Notes (Signed)
  Subjective:    Patient ID: Erika Rivera, female    DOB: [birth year], 75 y.o.   MRN: [id number]  HPI  here for followup - reviewed chronic medical issues:  dyslipidemia - on statin since fall 2010  no adv SE - reports 100% compiance with meds -  dtr reports poor pt compliance with diet  hypothyroid - last dose change 01/2011 - but variable compliance -denies adverse side effects related to current therapy.   arthiritis - reports compliance with ongoing medical treatment and no changes in medication dose or frequency.  denies adverse side effects related to current therapy.   HTN - reports compliance with ongoing medical treatment and no changes in medication dose or frequency.  denies adverse side effects related to current therapy. no CP, edema or SOB   Past Medical History  Diagnosis Date  . ARTHRITIS, HAND   . ARTHRITIS, LEFT SHOULDER   . HEARING LOSS   . SMOKER   . CEREBROVASCULAR ACCIDENT, HX OF   . HYPERTENSION   . HYPERLIPIDEMIA   . HYPOTHYROIDISM   . OVERACTIVE BLADDER     Review of Systems  Respiratory: Negative for shortness of breath.   Cardiovascular: Negative for chest pain.  Neurological: Negative for headaches.  continued RUE tremor     Objective:   Physical Exam  BP 166/82  Pulse 54  Temp(Src) 98 F (36.7 C) (Oral)  Ht 5\' 2"  (1.575 m)  Wt 176 lb 6.4 oz (80.015 kg)  BMI 32.26 kg/m2  SpO2 96% Wt Readings from Last 3 Encounters:  07/29/11 176 lb 6.4 oz (80.015 kg)  04/23/11 180 lb (81.647 kg)  02/25/10 191 lb (86.637 kg)   Constitutional: She appears well-developed and well-nourished. No distress. dtr at side Neck: Normal range of motion. Neck supple. No JVD present. No thyromegaly present.  Cardiovascular: Normal rate, regular rhythm and normal heart sounds.  No murmur heard. No BLE edema. Pulmonary/Chest: Effort normal and breath sounds normal. No respiratory distress. She has no wheezes.  Psychiatric: She has a normal mood and affect. Her  behavior is normal. Judgment and thought content normal.  Neuro: mild RUE intention tremor/outstretched hand Skin: ingrown toenails B feet - dry flaking skin   Lab Results  Component Value Date   WBC 5.6 08/05/2009   HGB 12.3 08/05/2009   HCT 36.1 08/05/2009   PLT 252.0 08/05/2009   CHOL 208* 04/23/2011   TRIG 76.0 04/23/2011   HDL 54.50 04/23/2011   LDLDIRECT 138.6 04/23/2011   ALT 10 04/23/2011   AST 25 04/23/2011   NA 144 04/23/2011   K 4.0 04/23/2011   CL 110 04/23/2011   CREATININE 1.6* 04/23/2011   BUN 22 04/23/2011   CO2 26 04/23/2011   TSH 29.47* 04/23/2011        Assessment & Plan:  See problem list. Medications and labs reviewed today.

## 2011-07-29 NOTE — Assessment & Plan Note (Signed)
Last dose increase 01/2011, then noncompliance with meds summer 2012 Check tsh now - adjust as needed Lab Results  Component Value Date   TSH 29.47* 04/23/2011

## 2011-08-20 ENCOUNTER — Ambulatory Visit: Payer: Medicare Other | Admitting: Endocrinology

## 2011-09-01 ENCOUNTER — Encounter: Payer: Self-pay | Admitting: Endocrinology

## 2011-09-01 ENCOUNTER — Ambulatory Visit (INDEPENDENT_AMBULATORY_CARE_PROVIDER_SITE_OTHER): Payer: Medicare Other | Admitting: Endocrinology

## 2011-09-01 DIAGNOSIS — E039 Hypothyroidism, unspecified: Secondary | ICD-10-CM

## 2011-09-01 DIAGNOSIS — R209 Unspecified disturbances of skin sensation: Secondary | ICD-10-CM | POA: Insufficient documentation

## 2011-09-01 MED ORDER — LEVOTHYROXINE SODIUM 150 MCG PO CAPS: 1.0000 | ORAL_CAPSULE | Freq: Every day | ORAL | Status: DC

## 2011-09-01 NOTE — Progress Notes (Signed)
Subjective:    Patient ID: Erika Rivera, female    DOB: [birth year], 75 y.o.   MRN: [id number]  HPI Pt says many years of hypothyroidism.  She says she has been on synthroid continuously since then.  She says she has been on 112 mcg/day, x a few mos.  Despite this, tsh continues to be elevated.   Symptomatically, pt reports many years of moderate loss of hair on the head, but no assoc itching.   Past Medical History  Diagnosis Date  . ARTHRITIS, HAND   . ARTHRITIS, LEFT SHOULDER   . HEARING LOSS   . SMOKER   . CEREBROVASCULAR ACCIDENT, HX OF   . HYPERTENSION   . HYPERLIPIDEMIA   . HYPOTHYROIDISM   . OVERACTIVE BLADDER    Past Surgical History  Procedure Date  . No past surgeries    History   Social History  . Marital Status: Widowed    Spouse Name: N/A    Number of Children: N/A  . Years of Education: N/A   Occupational History  . Not on file.   Social History Main Topics  . Smoking status: Current Everyday Smoker  . Smokeless tobacco: Not on file   Comment: Widowed, lives with dtr. retired from [employer] and [employer] ([employer])  . Alcohol Use: No  . Drug Use: No  . Sexually Active:    Other Topics Concern  . Not on file   Social History Narrative  . No narrative on file   Current Outpatient Prescriptions on File Prior to Visit  Medication Sig Dispense Refill  . BENICAR HCT 40-12.5 MG per tablet TAKE 1 TABLET BY MOUTH DAILY  30 tablet  5  . levothyroxine (SYNTHROID, LEVOTHROID) 112 MCG tablet Take 1 tablet (112 mcg total) by mouth daily.  30 tablet  11  . propranolol (INDERAL LA) 80 MG 24 hr capsule Take 1 capsule (80 mg total) by mouth daily.  30 capsule  11  . simvastatin (ZOCOR) 20 MG tablet 1 BY MOUTH AT BEDTIME  30 tablet  5  . tiZANidine (ZANAFLEX) 2 MG tablet Take 2 mg by mouth 3 (three) times daily as needed.        . triamcinolone (KENALOG) 0.1 % lotion Apply twice daily to affected skin as needed  60 mL  0   No Known Allergies  Family History    Problem Relation Age of Onset  . Gout Father   . Arthritis Other   . Hypertension Other   . Kidney disease Other   . Heart disease Other   . Thyroid disease Other   aunt had uncertain type of thyroid problem  There were no vitals taken for this visit.  Review of Systems denies depression, cramps, sob, constipation, blurry vision, myalgias, and syncope. She reports weight loss, dry skin, easy bruising, cold intolerance, rhinorrhea, and mild memory loss.  She has numbness of the feet.    Objective:   Physical Exam VS: see vs page GEN: no distress HEAD: head: no deformity eyes: no periorbital swelling, no proptosis external nose and ears are normal mouth: no lesion seen Right hearing aid NECK: supple, thyroid is not enlarged CHEST WALL: no deformity LUNGS:  Clear to auscultation CV: reg rate and rhythm, no murmur. ABD: abdomen is soft, nontender.  no hepatosplenomegaly.  not distended.  no hernia MUSCULOSKELETAL: muscle bulk and strength are grossly normal.  no obvious joint swelling.  gait is normal and steady. EXTEMITIES: no deformity.  no ulcer on the  feet.  feet are of normal color and temp.  no edema PULSES: dorsalis pedis intact bilat.  no carotid bruit NEURO:  cn 2-12 grossly intact.   readily moves all 4's.  sensation is intact to touch on the feet SKIN:  Normal texture.  Cool to touch.  No rash or suspicious lesion is visible.  Skin of the legs is dry and scaly.     NODES:  None palpable at the neck PSYCH: alert, oriented x3.  Does not appear anxious nor depressed. Lab Results  Component Value Date   TSH 39.85* 07/29/2011      Assessment & Plan:  Hypothyroidism, needs increased rx Hair loss, possibly due to the hypothyroidism Bradycardia, due to b-blocker and hypothyroidism. Numbness. In view of multiple autoimmune disorders, she is at high risk for b-12 deficiency.  i have requested b-12 level with repeat tsh in January.

## 2011-09-01 NOTE — Patient Instructions (Addendum)
increase levothyroxine to 150 mcg/day. Please make an appointment to come back for a follow-up appointment in january.

## 2011-10-02 ENCOUNTER — Emergency Department (HOSPITAL_COMMUNITY)
Admission: EM | Admit: 2011-10-02 | Discharge: 2011-10-03 | Disposition: A | Discharge status: Home / Self Care | Payer: Medicare Other | Attending: Emergency Medicine | Admitting: Emergency Medicine

## 2011-10-02 ENCOUNTER — Encounter (HOSPITAL_COMMUNITY): Payer: Self-pay | Admitting: *Deleted

## 2011-10-02 DIAGNOSIS — E039 Hypothyroidism, unspecified: Secondary | ICD-10-CM | POA: Insufficient documentation

## 2011-10-02 DIAGNOSIS — E785 Hyperlipidemia, unspecified: Secondary | ICD-10-CM | POA: Insufficient documentation

## 2011-10-02 DIAGNOSIS — I10 Essential (primary) hypertension: Secondary | ICD-10-CM | POA: Insufficient documentation

## 2011-10-02 DIAGNOSIS — Z79899 Other long term (current) drug therapy: Secondary | ICD-10-CM | POA: Insufficient documentation

## 2011-10-02 DIAGNOSIS — I951 Orthostatic hypotension: Secondary | ICD-10-CM | POA: Insufficient documentation

## 2011-10-02 DIAGNOSIS — R42 Dizziness and giddiness: Secondary | ICD-10-CM | POA: Insufficient documentation

## 2011-10-02 DIAGNOSIS — N390 Urinary tract infection, site not specified: Secondary | ICD-10-CM | POA: Insufficient documentation

## 2011-10-02 DIAGNOSIS — Z8673 Personal history of transient ischemic attack (TIA), and cerebral infarction without residual deficits: Secondary | ICD-10-CM | POA: Insufficient documentation

## 2011-10-02 MED ORDER — SODIUM CHLORIDE 0.9 % IV BOLUS (SEPSIS)
1000.0000 mL | Freq: Once | INTRAVENOUS | Status: AC
  Administered 2011-10-03: 1000 mL via INTRAVENOUS

## 2011-10-02 NOTE — ED Notes (Signed)
[id number]<XB> Expected date:<BR> Expected time:<BR> Means of arrival:<BR> Comments:<BR> Hold for triage 1

## 2011-10-02 NOTE — ED Notes (Addendum)
Pt in c/o episode of dizziness and had syncopal episode PTA, pt still feels off balance at this time, pt hit head when she fell

## 2011-10-03 LAB — URINALYSIS, ROUTINE W REFLEX MICROSCOPIC
Bilirubin Urine: NEGATIVE
Glucose, UA: NEGATIVE mg/dL
Ketones, ur: NEGATIVE mg/dL
pH: 7.5 (ref 5.0–8.0)

## 2011-10-03 LAB — BASIC METABOLIC PANEL
BUN: 34 mg/dL — ABNORMAL HIGH (ref 6–23)
CO2: 26 mEq/L (ref 19–32)
Chloride: 107 mEq/L (ref 96–112)
Creatinine, Ser: 1.63 mg/dL — ABNORMAL HIGH (ref 0.50–1.10)
Glucose, Bld: 139 mg/dL — ABNORMAL HIGH (ref 70–99)

## 2011-10-03 LAB — CBC
HCT: 36 % (ref 36.0–46.0)
Hemoglobin: 12 g/dL (ref 12.0–15.0)
MCV: 94.5 fL (ref 78.0–100.0)
RBC: 3.81 MIL/uL — ABNORMAL LOW (ref 3.87–5.11)
RDW: 16.4 % — ABNORMAL HIGH (ref 11.5–15.5)
WBC: 8.2 10*3/uL (ref 4.0–10.5)

## 2011-10-03 LAB — URINE MICROSCOPIC-ADD ON

## 2011-10-03 MED ORDER — DEXTROSE 5 % IV SOLN
1.0000 g | INTRAVENOUS | Status: DC
  Administered 2011-10-03: 1 g via INTRAVENOUS
  Filled 2011-10-03: qty 10

## 2011-10-03 MED ORDER — CIPROFLOXACIN HCL 500 MG PO TABS: 500.0000 mg | ORAL_TABLET | Freq: Two times a day (BID) | ORAL | Status: AC

## 2011-10-03 NOTE — ED Provider Notes (Signed)
History     CSN: [id number]  Arrival date & time 10/02/11  2231   First MD Initiated Contact with Patient 10/02/11 2311      Chief Complaint  Patient presents with  . Loss of Consciousness    (Consider location/radiation/quality/duration/timing/severity/associated sxs/prior treatment) The history is provided by the patient.   the patient reports standing up and walking out of the front porch at which point she became lightheadedness and had a syncopal episode.  She denied chest pain or palpitations prior to this.  She denies nausea vomiting or diarrhea.  She denies melena and hematochezia.  She denies urinary frequency or dysuria.  She has no headache at this time.  She reports no unilateral arm or leg weakness.  She has no neck pain.  She currently is without complaints.  Past Medical History  Diagnosis Date  . ARTHRITIS, HAND   . ARTHRITIS, LEFT SHOULDER   . HEARING LOSS   . SMOKER   . CEREBROVASCULAR ACCIDENT, HX OF   . HYPERTENSION   . HYPERLIPIDEMIA   . HYPOTHYROIDISM   . OVERACTIVE BLADDER     Past Surgical History  Procedure Date  . No past surgeries     Family History  Problem Relation Age of Onset  . Gout Father   . Arthritis Other   . Hypertension Other   . Kidney disease Other   . Heart disease Other   . Thyroid disease Other     History  Substance Use Topics  . Smoking status: Current Everyday Smoker -- 0.6 packs/day  . Smokeless tobacco: Not on file   Comment: Widowed, lives with dtr. retired from [employer] ([employer])  . Alcohol Use: No    OB History    Grav Para Term Preterm Abortions TAB SAB Ect Mult Living                  Review of Systems  Cardiovascular: Positive for syncope.  All other systems reviewed and are negative.    Allergies  Benadryl allergy and Penicillins  Home Medications   Current Outpatient Rx  Name Route Sig Dispense Refill  . BENICAR HCT 40-12.5 MG PO TABS  TAKE 1 TABLET BY MOUTH DAILY 30 tablet 5    . LEVOTHYROXINE SODIUM 150 MCG PO CAPS Oral Take 1 capsule (150 mcg total) by mouth daily. 30 capsule 2  . PROPRANOLOL HCL ER 80 MG PO CP24 Oral Take 1 capsule (80 mg total) by mouth daily. 30 capsule 11  . SIMVASTATIN 20 MG PO TABS  1 BY MOUTH AT BEDTIME 30 tablet 5  . TRIAMCINOLONE ACETONIDE 0.1 % EX LOTN  Apply twice daily to affected skin as needed 60 mL 0  . CIPROFLOXACIN HCL 500 MG PO TABS Oral Take 1 tablet (500 mg total) by mouth 2 (two) times daily. 14 tablet 0    BP 187/78  Pulse 61  Temp(Src) 98.8 F (37.1 C) (Oral)  Resp 16  SpO2 96%  Physical Exam  Nursing note and vitals reviewed. Constitutional: She is oriented to person, place, and time. She appears well-developed and well-nourished.  HENT:  Head: Normocephalic and atraumatic.       Dry mucous membranes  Eyes: Pupils are equal, round, and reactive to light.  Neck: Normal range of motion.  Cardiovascular: Normal rate and regular rhythm.   Pulmonary/Chest: Effort normal and breath sounds normal. No respiratory distress. She has no wheezes.  Abdominal: Soft. She exhibits no distension. There is no tenderness.  Musculoskeletal: Normal range of motion. She exhibits no edema and no tenderness.  Neurological: She is alert and oriented to person, place, and time.       5/5 strength in major muscle groups of  bilateral upper and lower extremities. Speech normal. No facial asymetry.   Skin: Skin is warm.  Psychiatric: She has a normal mood and affect. Judgment normal.    ED Course  Procedures (including critical care time)   Date: 10/04/2011  Rate: 63  Rhythm: normal sinus rhythm  QRS Axis: normal  Intervals: normal  ST/T Wave abnormalities: normal  Conduction Disutrbances:none  Narrative Interpretation:   Old EKG Reviewed: No significant changes noted     Labs Reviewed  CBC - Abnormal; Notable for the following:    RBC 3.81 (*)    RDW 16.4 (*)    All other components within normal limits  BASIC METABOLIC  PANEL - Abnormal; Notable for the following:    Glucose, Bld 139 (*)    BUN 34 (*)    Creatinine, Ser 1.63 (*)    GFR calc non Af Amer 30 (*)    GFR calc Af Amer 35 (*)    All other components within normal limits  URINALYSIS, ROUTINE W REFLEX MICROSCOPIC - Abnormal; Notable for the following:    APPearance CLOUDY (*)    Protein, ur 100 (*)    Leukocytes, UA SMALL (*)    All other components within normal limits  URINE MICROSCOPIC-ADD ON - Abnormal; Notable for the following:    Squamous Epithelial / LPF FEW (*)    Bacteria, UA MANY (*)    All other components within normal limits  URINE CULTURE   No results found.   1. Urinary tract infection   2. Orthostasis       MDM    The patient feels much better after IV fluids.  I suspect she was mildly dehydrated.  She does have evidence of urinary tract infection with otherwise normal vital signs.  She's been given a dose of Rocephin and will be discharged home with a prescription for ciprofloxacin.  Patient and family agreeable to outpatient plan.  They understand to return to the ER for any new or worsening symptoms     Lyanne Co, MD 10/04/11 [phone]

## 2011-10-04 LAB — URINE CULTURE

## 2011-10-22 ENCOUNTER — Encounter: Payer: Self-pay | Admitting: Internal Medicine

## 2011-10-22 ENCOUNTER — Encounter: Payer: Self-pay | Admitting: Endocrinology

## 2011-10-22 ENCOUNTER — Ambulatory Visit (INDEPENDENT_AMBULATORY_CARE_PROVIDER_SITE_OTHER): Payer: Medicare Other | Admitting: Endocrinology

## 2011-10-22 ENCOUNTER — Other Ambulatory Visit (INDEPENDENT_AMBULATORY_CARE_PROVIDER_SITE_OTHER): Payer: Medicare Other

## 2011-10-22 ENCOUNTER — Ambulatory Visit (INDEPENDENT_AMBULATORY_CARE_PROVIDER_SITE_OTHER): Payer: Medicare Other | Admitting: Internal Medicine

## 2011-10-22 VITALS — BP 134/72 | HR 57 | Temp 96.9°F | Wt 173.1 lb

## 2011-10-22 VITALS — BP 134/72 | HR 57 | Temp 96.9°F | Ht 64.0 in | Wt 173.0 lb

## 2011-10-22 DIAGNOSIS — I10 Essential (primary) hypertension: Secondary | ICD-10-CM

## 2011-10-22 DIAGNOSIS — E039 Hypothyroidism, unspecified: Secondary | ICD-10-CM

## 2011-10-22 DIAGNOSIS — R55 Syncope and collapse: Secondary | ICD-10-CM

## 2011-10-22 DIAGNOSIS — E785 Hyperlipidemia, unspecified: Secondary | ICD-10-CM

## 2011-10-22 LAB — TSH: TSH: 13.15 u[IU]/mL — ABNORMAL HIGH (ref 0.35–5.50)

## 2011-10-22 MED ORDER — ASPIRIN EC 81 MG PO TBEC: 81.0000 mg | DELAYED_RELEASE_TABLET | Freq: Every day | ORAL | Status: DC

## 2011-10-22 MED ORDER — LEVOTHYROXINE SODIUM 175 MCG PO TABS: 175.0000 ug | ORAL_TABLET | Freq: Every day | ORAL | Status: DC

## 2011-10-22 NOTE — Assessment & Plan Note (Signed)
Hx CVA - but Noncompliance with statin summer 2012 Improved last lab check 07/2011 - continue same Lipid Panel     Component Value Date/Time   CHOL 183 07/29/2011 1101   TRIG 72.0 07/29/2011 1101   HDL 52.60 07/29/2011 1101   CHOLHDL 3 07/29/2011 1101   VLDL 14.4 07/29/2011 1101   LDLCALC 116* 07/29/2011 1101

## 2011-10-22 NOTE — Progress Notes (Signed)
Subjective:    Patient ID: Erika Rivera, female    DOB: [birth year], 76 y.o.   MRN: [id number]  HPI  here for followup - reviewed chronic medical issues:  dyslipidemia - on statin since fall 2010  no adv SE - reports 100% compiance with meds -  dtr reports poor pt compliance with diet  hypothyroid - working with endo on same - variable compliance -denies adverse side effects related to current therapy.   arthiritis - reports compliance with ongoing medical treatment and no changes in medication dose or frequency.  denies adverse side effects related to current therapy.   HTN - reports compliance with ongoing medical treatment and no changes in medication dose or frequency. denies adverse side effects related to current therapy. no chest pain, edema or shortness of breath    ALSO complains of syncope x 2 events: Seen in ER 10/02/11 for syncope, standing on porch and "went out", hit head on back of wall - no seizure or chest pain, momentary loss of consciousness  - dx UTI on Er review 2nd syncope event 10/20/11 at home upon getting out of bed and going to bathroom - "head got fuzzy and i saw myself going down" - dtr witnessed both events - no incontinence or confusion  Currently denies chest pain, dyspnea on exertion, palpitations or headache -    Past Medical History  Diagnosis Date  . ARTHRITIS, HAND   . ARTHRITIS, LEFT SHOULDER   . HEARING LOSS   . SMOKER   . CEREBROVASCULAR ACCIDENT, HX OF   . HYPERTENSION   . HYPERLIPIDEMIA   . HYPOTHYROIDISM   . OVERACTIVE BLADDER     Review of Systems  Respiratory: Negative for shortness of breath and wheezing.   Cardiovascular: Negative for chest pain and palpitations.  Neurological: Positive for syncope. Negative for dizziness, facial asymmetry, speech difficulty, weakness, light-headedness and headaches.  continued RUE tremor     Objective:   Physical Exam  BP 134/72  Pulse 57  Temp(Src) 96.9 F (36.1 C) (Oral)  Wt 173 lb  1.9 oz (78.527 kg)  SpO2 98% Wt Readings from Last 3 Encounters:  10/22/11 173 lb 1.9 oz (78.527 kg)  09/01/11 177 lb (80.287 kg)  07/29/11 176 lb 6.4 oz (80.015 kg)   Constitutional: She appears well-developed and well-nourished. No distress. dtr at side Neck: Normal range of motion. Neck supple. No JVD present. No thyromegaly present.  Cardiovascular: Normal rate, regular rhythm and normal heart sounds.  No murmur heard. No BLE edema. Pulmonary/Chest: Effort normal and breath sounds normal. No respiratory distress. She has no wheezes.  Neuro: AAOx3, CN2-12 symmetrically intact - speech fluent, follows 3 step commands and MAE well - gait normal, unaides Psychiatric: She has a normal mood and affect. Her behavior is normal. Judgment and thought content normal.    Lab Results  Component Value Date   WBC 8.2 10/03/2011   HGB 12.0 10/03/2011   HCT 36.0 10/03/2011   PLT 260 10/03/2011   CHOL 183 07/29/2011   TRIG 72.0 07/29/2011   HDL 52.60 07/29/2011   LDLDIRECT 138.6 04/23/2011   ALT 10 04/23/2011   AST 25 04/23/2011   NA 141 10/03/2011   K 3.5 10/03/2011   CL 107 10/03/2011   CREATININE 1.63* 10/03/2011   BUN 34* 10/03/2011   CO2 26 10/03/2011   TSH 39.85* 07/29/2011        Assessment & Plan:  Syncope - EKG today: sinus brady at 55bpm - poor R  wave progress and nonsp T change s- no change from 10/2010 tracing in EMR (reviewed today)  ER labs 10/03/11 reviewed - WNL Refer for cardiac stress test and for head ct (given 12/29 blunt head trauma) Start ASA daily pending results

## 2011-10-22 NOTE — Assessment & Plan Note (Signed)
Last dose increase 01/2011, then noncompliance with meds summer 2012 Started working with endo for same fall 2012 - reports now taking meds Check tsh now - adjust as needed Lab Results  Component Value Date   TSH 39.85* 07/29/2011

## 2011-10-22 NOTE — Progress Notes (Signed)
  Subjective:    Patient ID: Erika Rivera, female    DOB: 04-10-1936, 76 y.o.   MRN: [id number]  HPI Pt says many years of hypothyroidism.  She says she has been on synthroid continuously since then.  She says she has been on 112 mcg/day, x a few mos.  Despite this, tsh continues to be elevated.  She says she takes the synthroid as rx'ed.   Past Medical History  Diagnosis Date  . ARTHRITIS, HAND   . ARTHRITIS, LEFT SHOULDER   . HEARING LOSS   . SMOKER   . CEREBROVASCULAR ACCIDENT, HX OF   . HYPERTENSION   . HYPERLIPIDEMIA   . HYPOTHYROIDISM   . OVERACTIVE BLADDER     Past Surgical History  Procedure Date  . No past surgeries     History   Social History  . Marital Status: Widowed    Spouse Name: N/A    Number of Children: N/A  . Years of Education: N/A   Occupational History  . Not on file.   Social History Main Topics  . Smoking status: Current Everyday Smoker -- 0.6 packs/day  . Smokeless tobacco: Not on file   Comment: Widowed, lives with dtr. retired from [employer] ([employer])  . Alcohol Use: No  . Drug Use: No  . Sexually Active: Not on file   Other Topics Concern  . Not on file   Social History Narrative  . No narrative on file    Current Outpatient Prescriptions on File Prior to Visit  Medication Sig Dispense Refill  . aspirin EC 81 MG tablet Take 1 tablet (81 mg total) by mouth daily.  150 tablet  2  . BENICAR HCT 40-12.5 MG per tablet TAKE 1 TABLET BY MOUTH DAILY  30 tablet  5  . propranolol (INDERAL LA) 80 MG 24 hr capsule Take 1 capsule (80 mg total) by mouth daily.  30 capsule  11  . simvastatin (ZOCOR) 20 MG tablet 1 BY MOUTH AT BEDTIME  30 tablet  5  . triamcinolone (KENALOG) 0.1 % lotion Apply twice daily to affected skin as needed  60 mL  0    Allergies  Allergen Reactions  . Benadryl Allergy   . Penicillins     Family History  Problem Relation Age of Onset  . Gout Father   . Arthritis Other   . Hypertension Other   .  Kidney disease Other   . Heart disease Other   . Thyroid disease Other     BP 134/72  Pulse 57  Temp(Src) 96.9 F (36.1 C) (Oral)  Ht 5\' 4"  (1.626 m)  Wt 173 lb (78.472 kg)  BMI 29.70 kg/m2  SpO2 98%    Review of Systems Denies weight change.      Objective:   Physical Exam VITAL SIGNS:  See vs page GENERAL: no distress NECK: There is no palpable thyroid enlargement.  No thyroid nodule is palpable.  No palpable lymphadenopathy at the anterior neck.  Lab Results  Component Value Date   TSH 13.15* 10/22/2011      Assessment & Plan:  Hypothyroidism.  Improved, but needs increased rx

## 2011-10-22 NOTE — Patient Instructions (Addendum)
blood tests are being requested for you today.  please call [phone] to hear your test results.  You will be prompted to enter the 9-digit "MRN" number that appears at the top left of this page, followed by #.  Then you will hear the message (update: i left message on phone-tree:  Increase synthroid to 175/d.  Go to lab in 1 month, for recheck.)

## 2011-10-22 NOTE — Assessment & Plan Note (Signed)
BP Readings from Last 3 Encounters:  10/22/11 134/72  10/03/11 161/70  09/01/11 148/78   Added Bbloc to ARB/hct 04/2011 for BP control and RUE tremor symptoms  Variable compliance (none last few days) -  Encouraged compliance rather than prescribing med change at this time

## 2011-10-22 NOTE — Patient Instructions (Signed)
It was good to see you today. Test(s) ordered today. Your results will be called to you after review (48-72hours after test completion). If any changes need to be made, you will be notified at that time. Medications reviewed, start baby Aspirin daily until further testing complete.  we'll make referral for cardiac stress test and for head ct. Our office will contact you regarding appointment(s) once made.

## 2011-10-28 ENCOUNTER — Ambulatory Visit (INDEPENDENT_AMBULATORY_CARE_PROVIDER_SITE_OTHER)
Admission: RE | Admit: 2011-10-28 | Discharge: 2011-10-28 | Disposition: A | Discharge status: Home / Self Care | Payer: Medicare Other | Source: Ambulatory Visit | Attending: Internal Medicine | Admitting: Internal Medicine

## 2011-10-28 DIAGNOSIS — R55 Syncope and collapse: Secondary | ICD-10-CM

## 2011-10-28 DIAGNOSIS — E785 Hyperlipidemia, unspecified: Secondary | ICD-10-CM

## 2011-10-28 DIAGNOSIS — I10 Essential (primary) hypertension: Secondary | ICD-10-CM

## 2011-11-10 ENCOUNTER — Ambulatory Visit (HOSPITAL_COMMUNITY): Payer: Medicare Other | Attending: Cardiology | Admitting: Radiology

## 2011-11-10 DIAGNOSIS — Z8249 Family history of ischemic heart disease and other diseases of the circulatory system: Secondary | ICD-10-CM | POA: Insufficient documentation

## 2011-11-10 DIAGNOSIS — R55 Syncope and collapse: Secondary | ICD-10-CM

## 2011-11-10 DIAGNOSIS — I10 Essential (primary) hypertension: Secondary | ICD-10-CM | POA: Insufficient documentation

## 2011-11-10 DIAGNOSIS — E785 Hyperlipidemia, unspecified: Secondary | ICD-10-CM

## 2011-11-10 DIAGNOSIS — R42 Dizziness and giddiness: Secondary | ICD-10-CM | POA: Insufficient documentation

## 2011-11-10 DIAGNOSIS — R0602 Shortness of breath: Secondary | ICD-10-CM

## 2011-11-10 DIAGNOSIS — F17200 Nicotine dependence, unspecified, uncomplicated: Secondary | ICD-10-CM | POA: Insufficient documentation

## 2011-11-10 MED ORDER — REGADENOSON 0.4 MG/5ML IV SOLN
0.4000 mg | Freq: Once | INTRAVENOUS | Status: AC
  Administered 2011-11-10: 0.4 mg via INTRAVENOUS

## 2011-11-10 MED ORDER — TECHNETIUM TC 99M TETROFOSMIN IV KIT
11.0000 | PACK | Freq: Once | INTRAVENOUS | Status: AC | PRN
  Administered 2011-11-10: 11 via INTRAVENOUS

## 2011-11-10 MED ORDER — TECHNETIUM TC 99M TETROFOSMIN IV KIT
33.0000 | PACK | Freq: Once | INTRAVENOUS | Status: AC | PRN
  Administered 2011-11-10: 33 via INTRAVENOUS

## 2011-11-10 NOTE — Progress Notes (Signed)
[facility] SITE 3 NUCLEAR MED [address] [place] [address] [phone]  Cardiology Nuclear Med Study  Erika Rivera is a 76 y.o. female [id number] January 24, 1936   Nuclear Med Background Indication for Stress Test:  Evaluation for Ischemia and Patient seen in hospital on 10/02/11 for Syncope History:  No previous documented CAD Cardiac Risk Factors: Family History - CAD, Hypertension, Lipids and Smoker  Symptoms:  Dizziness, DOE, Light-Headedness and Syncope   Nuclear Pre-Procedure Caffeine/Decaff Intake:  None NPO After: 8:30pm   Lungs:  Diminished breath sounds, (-) wheezes IV 0.9% NS with Angio Cath:  22g  IV Site: L Forearm x 2, tolerated well IV Started by:  Cathlyn Parsons, RN  Chest Size (in):  38 Cup Size: B  Height: 5\' 5"  (1.651 m)  Weight:  173 lb (78.472 kg)  BMI:  Body mass index is 28.79 kg/(m^2). Tech Comments:  Propranolol held 24 hrs    Nuclear Med Study 1 or 2 day study: 1 day  Stress Test Type:  Lexiscan  Reading MD: Willa Rough, MD  Order Authorizing Provider:  Rene Paci, MD  Resting Radionuclide: Technetium 22m Tetrofosmin  Resting Radionuclide Dose: 11.0 mCi   Stress Radionuclide:  Technetium 33m Tetrofosmin  Stress Radionuclide Dose: 33.0 mCi           Stress Protocol Rest HR: 61 Stress HR: 92  Rest BP: 125/97 Stress BP: 140/79  Exercise Time (min): n/a METS: n/a   Predicted Max HR: 147 bpm % Max HR: 62.59 bpm Rate Pressure Product: 72536   Dose of Adenosine (mg):  n/a Dose of Lexiscan: 0.4 mg  Dose of Atropine (mg): n/a Dose of Dobutamine: n/a mcg/kg/min (at max HR)  Stress Test Technologist: Irean Hong, RN  Nuclear Technologist:  Domenic Polite, CNMT     Rest Procedure:  Myocardial perfusion imaging was performed at rest 45 minutes following the intravenous administration of Technetium 66m Tetrofosmin. Rest ECG: NSR with poor R wave progression, Nonspecific T wave changes, PVC's  Stress Procedure:   The patient received IV Lexiscan 0.4 mg over 15-seconds.  Technetium 52m Tetrofosmin injected at 30-seconds.  There were no significant changes with Lexiscan. There were occasional PVC's. Quantitative spect images were obtained after a 45 minute delay. Stress ECG: No significant change from baseline ECG  QPS Raw Data Images:  Normal; no motion artifact; normal heart/lung ratio. Stress Images:  Normal homogeneous uptake in all areas of the myocardium. Rest Images:  Normal homogeneous uptake in all areas of the myocardium. Subtraction (SDS):  No evidence of ischemia. Transient Ischemic Dilatation (Normal <1.22):  1.04 Lung/Heart Ratio (Normal <0.45):  0.27  Quantitative Gated Spect Images QGS EDV:  96 ml QGS ESV:  46 ml QGS cine images:  Normal Wall Motion QGS EF: 52%  Impression Exercise Capacity:  Lexiscan with no exercise. BP Response:  Normal blood pressure response. Clinical Symptoms:  Cough ECG Impression:  No significant ST segment change suggestive of ischemia. Comparison with Prior Nuclear Study: No previous nuclear study performed  Overall Impression:  Normal stress nuclear study.  Willa Rough, MD

## 2011-11-12 ENCOUNTER — Other Ambulatory Visit: Payer: Self-pay | Admitting: Internal Medicine

## 2011-11-12 DIAGNOSIS — R55 Syncope and collapse: Secondary | ICD-10-CM

## 2011-11-20 ENCOUNTER — Ambulatory Visit (HOSPITAL_COMMUNITY): Payer: Medicare Other | Admitting: Radiology

## 2011-12-03 ENCOUNTER — Inpatient Hospital Stay (HOSPITAL_COMMUNITY)
Admission: EM | Admit: 2011-12-03 | Discharge: 2011-12-08 | DRG: 225 | Disposition: A | Discharge status: Home / Self Care | Payer: Medicare Other | Attending: Internal Medicine | Admitting: Internal Medicine

## 2011-12-03 ENCOUNTER — Encounter (HOSPITAL_COMMUNITY): Payer: Self-pay | Admitting: Emergency Medicine

## 2011-12-03 ENCOUNTER — Emergency Department (HOSPITAL_COMMUNITY): Payer: Medicare Other

## 2011-12-03 ENCOUNTER — Emergency Department (INDEPENDENT_AMBULATORY_CARE_PROVIDER_SITE_OTHER)
Admission: EM | Admit: 2011-12-03 | Discharge: 2011-12-03 | Disposition: A | Discharge status: Nursing Home (Includes ICF) | Payer: Medicare Other | Source: Home / Self Care | Attending: Family Medicine | Admitting: Family Medicine

## 2011-12-03 ENCOUNTER — Other Ambulatory Visit: Payer: Self-pay

## 2011-12-03 ENCOUNTER — Ambulatory Visit (HOSPITAL_COMMUNITY): Payer: Medicare Other

## 2011-12-03 DIAGNOSIS — F17200 Nicotine dependence, unspecified, uncomplicated: Secondary | ICD-10-CM | POA: Diagnosis present

## 2011-12-03 DIAGNOSIS — R259 Unspecified abnormal involuntary movements: Secondary | ICD-10-CM

## 2011-12-03 DIAGNOSIS — H9190 Unspecified hearing loss, unspecified ear: Secondary | ICD-10-CM

## 2011-12-03 DIAGNOSIS — I4729 Other ventricular tachycardia: Principal | ICD-10-CM | POA: Diagnosis present

## 2011-12-03 DIAGNOSIS — I10 Essential (primary) hypertension: Secondary | ICD-10-CM | POA: Diagnosis present

## 2011-12-03 DIAGNOSIS — R55 Syncope and collapse: Secondary | ICD-10-CM | POA: Diagnosis present

## 2011-12-03 DIAGNOSIS — Z8673 Personal history of transient ischemic attack (TIA), and cerebral infarction without residual deficits: Secondary | ICD-10-CM

## 2011-12-03 DIAGNOSIS — L408 Other psoriasis: Secondary | ICD-10-CM

## 2011-12-03 DIAGNOSIS — I2510 Atherosclerotic heart disease of native coronary artery without angina pectoris: Secondary | ICD-10-CM | POA: Diagnosis present

## 2011-12-03 DIAGNOSIS — E785 Hyperlipidemia, unspecified: Secondary | ICD-10-CM | POA: Diagnosis present

## 2011-12-03 DIAGNOSIS — I472 Ventricular tachycardia, unspecified: Principal | ICD-10-CM | POA: Diagnosis present

## 2011-12-03 DIAGNOSIS — I498 Other specified cardiac arrhythmias: Secondary | ICD-10-CM | POA: Diagnosis present

## 2011-12-03 DIAGNOSIS — R209 Unspecified disturbances of skin sensation: Secondary | ICD-10-CM

## 2011-12-03 DIAGNOSIS — Z880 Allergy status to penicillin: Secondary | ICD-10-CM

## 2011-12-03 DIAGNOSIS — R001 Bradycardia, unspecified: Secondary | ICD-10-CM | POA: Diagnosis present

## 2011-12-03 DIAGNOSIS — N318 Other neuromuscular dysfunction of bladder: Secondary | ICD-10-CM

## 2011-12-03 DIAGNOSIS — M19019 Primary osteoarthritis, unspecified shoulder: Secondary | ICD-10-CM

## 2011-12-03 DIAGNOSIS — E039 Hypothyroidism, unspecified: Secondary | ICD-10-CM | POA: Diagnosis present

## 2011-12-03 DIAGNOSIS — Z79899 Other long term (current) drug therapy: Secondary | ICD-10-CM

## 2011-12-03 DIAGNOSIS — M19049 Primary osteoarthritis, unspecified hand: Secondary | ICD-10-CM

## 2011-12-03 LAB — URINALYSIS, ROUTINE W REFLEX MICROSCOPIC
Bilirubin Urine: NEGATIVE
Glucose, UA: NEGATIVE mg/dL
Hgb urine dipstick: NEGATIVE
Specific Gravity, Urine: 1.003 — ABNORMAL LOW (ref 1.005–1.030)
Urobilinogen, UA: 0.2 mg/dL (ref 0.0–1.0)
pH: 6 (ref 5.0–8.0)

## 2011-12-03 LAB — CBC
HCT: 35.4 % — ABNORMAL LOW (ref 36.0–46.0)
Hemoglobin: 11.6 g/dL — ABNORMAL LOW (ref 12.0–15.0)
MCH: 31 pg (ref 26.0–34.0)
MCH: 31.3 pg (ref 26.0–34.0)
MCHC: 33.6 g/dL (ref 30.0–36.0)
MCV: 93.2 fL (ref 78.0–100.0)
MCV: 94.1 fL (ref 78.0–100.0)
Platelets: 220 10*3/uL (ref 150–400)
RBC: 3.74 MIL/uL — ABNORMAL LOW (ref 3.87–5.11)
RDW: 15.3 % (ref 11.5–15.5)
WBC: 6.6 10*3/uL (ref 4.0–10.5)
WBC: 7.2 10*3/uL (ref 4.0–10.5)

## 2011-12-03 LAB — DIFFERENTIAL
Eosinophils Absolute: 0.2 10*3/uL (ref 0.0–0.7)
Lymphocytes Relative: 37 % (ref 12–46)
Lymphs Abs: 2.4 10*3/uL (ref 0.7–4.0)
Monocytes Relative: 6 % (ref 3–12)
Neutrophils Relative %: 53 % (ref 43–77)

## 2011-12-03 LAB — BASIC METABOLIC PANEL
BUN: 18 mg/dL (ref 6–23)
CO2: 22 mEq/L (ref 19–32)
GFR calc non Af Amer: 43 mL/min — ABNORMAL LOW (ref >90)
Glucose, Bld: 70 mg/dL (ref 70–99)
Potassium: 3.8 mEq/L (ref 3.5–5.1)

## 2011-12-03 LAB — POCT I-STAT TROPONIN I: Troponin i, poc: 0 ng/mL (ref 0.00–0.08)

## 2011-12-03 MED ORDER — OLMESARTAN MEDOXOMIL-HCTZ 40-12.5 MG PO TABS: 1.0000 | ORAL_TABLET | Freq: Every day | ORAL | Status: DC

## 2011-12-03 MED ORDER — SODIUM CHLORIDE 0.9 % IJ SOLN
3.0000 mL | Freq: Two times a day (BID) | INTRAMUSCULAR | Status: DC
  Administered 2011-12-03 – 2011-12-05 (×5): 3 mL via INTRAVENOUS
  Administered 2011-12-06: 10:00:00 via INTRAVENOUS
  Administered 2011-12-06: 3 mL via INTRAVENOUS

## 2011-12-03 MED ORDER — SODIUM CHLORIDE 0.9 % IJ SOLN
3.0000 mL | INTRAMUSCULAR | Status: DC | PRN
  Filled 2011-12-03: qty 3

## 2011-12-03 MED ORDER — LEVOTHYROXINE SODIUM 175 MCG PO TABS
175.0000 ug | ORAL_TABLET | Freq: Every day | ORAL | Status: DC
  Administered 2011-12-04 – 2011-12-08 (×5): 175 ug via ORAL
  Filled 2011-12-03 (×6): qty 1

## 2011-12-03 MED ORDER — SODIUM CHLORIDE 0.9 % IJ SOLN
3.0000 mL | Freq: Two times a day (BID) | INTRAMUSCULAR | Status: DC
  Administered 2011-12-05 – 2011-12-06 (×2): 3 mL via INTRAVENOUS

## 2011-12-03 MED ORDER — ENOXAPARIN SODIUM 40 MG/0.4ML ~~LOC~~ SOLN
40.0000 mg | SUBCUTANEOUS | Status: DC
  Administered 2011-12-04 – 2011-12-08 (×4): 40 mg via SUBCUTANEOUS
  Filled 2011-12-03 (×5): qty 0.4

## 2011-12-03 MED ORDER — SODIUM CHLORIDE 0.9 % IV SOLN: 250.0000 mL | INTRAVENOUS | Status: DC | PRN

## 2011-12-03 MED ORDER — IOHEXOL 350 MG/ML SOLN
50.0000 mL | Freq: Once | INTRAVENOUS | Status: AC | PRN
  Administered 2011-12-03: 50 mL via INTRAVENOUS

## 2011-12-03 MED ORDER — HYDROCHLOROTHIAZIDE 12.5 MG PO CAPS
12.5000 mg | ORAL_CAPSULE | Freq: Every day | ORAL | Status: DC
  Administered 2011-12-04 – 2011-12-08 (×5): 12.5 mg via ORAL
  Filled 2011-12-03 (×5): qty 1

## 2011-12-03 MED ORDER — SIMVASTATIN 20 MG PO TABS
20.0000 mg | ORAL_TABLET | Freq: Every evening | ORAL | Status: DC
  Administered 2011-12-04 – 2011-12-07 (×4): 20 mg via ORAL
  Filled 2011-12-03 (×5): qty 1

## 2011-12-03 MED ORDER — IRBESARTAN 300 MG PO TABS
300.0000 mg | ORAL_TABLET | Freq: Every day | ORAL | Status: DC
  Administered 2011-12-04 – 2011-12-08 (×5): 300 mg via ORAL
  Filled 2011-12-03 (×5): qty 1

## 2011-12-03 MED ORDER — ASPIRIN EC 81 MG PO TBEC
81.0000 mg | DELAYED_RELEASE_TABLET | Freq: Every day | ORAL | Status: DC
  Administered 2011-12-04 – 2011-12-08 (×5): 81 mg via ORAL
  Filled 2011-12-03 (×5): qty 1

## 2011-12-03 MED ORDER — ALBUTEROL SULFATE (5 MG/ML) 0.5% IN NEBU: 2.5000 mg | INHALATION_SOLUTION | RESPIRATORY_TRACT | Status: DC | PRN

## 2011-12-03 MED ORDER — SODIUM CHLORIDE 0.9 % IV SOLN: INTRAVENOUS | Status: DC

## 2011-12-03 NOTE — ED Notes (Signed)
Check patient blood sugar it was 77 notified RN Scott of blood sugar

## 2011-12-03 NOTE — ED Provider Notes (Signed)
History     CSN: [id number]  Arrival date & time 12/03/11  1109   First MD Initiated Contact with Patient 12/03/11 1142      Chief Complaint  Patient presents with  . Fall    (Consider location/radiation/quality/duration/timing/severity/associated sxs/prior treatment) HPI Comments: Erika Rivera presents today for evaluation of syncope. She reports that this is the second time that she has passed out this week. She reports that she woke up around 3:30 this morning to go to the bathroom, became dizzy, fell forward, striking her head against the door frame. She reports that she woke up on the floor after an unknown amount of time. This fall was not witnessed. After the fall, she reports that she was unable to get up immediately and had to drag herself to the couch to get up. She denies any injury or residual pain from the fall. She denies any vision changes, nausea, vomiting. She denies any chest pain or shortness of breath. She reports a second time that she passed out this week, which occurred on Sunday. This occurred while she was in the bathroom and was unable to get up and into the door when her daughter was knocking. Her daughter reports that this is the fourth or fifth episode of syncope. Over the last 2 months. Her daughter reports that she started propanolol over the last 3 months. Her daughter also reports that this syncopal episodes have been worked up recently, with a head CT, and a stress test. She was supposed to have an echocardiogram done today, but missed that appointment secondary to presenting here for evaluation for syncope. she has a past medical history significant for hypertension and a stroke. EKG at the urgent care Center shows sinus bradycardia with a heart rate of 48. Otherwise no ST or T changes. Exam is otherwise unremarkable. She is neurologically intact.  Patient is a 76 y.o. female presenting with syncope. The history is provided by the patient.  Loss of Consciousness This  is a new problem. The current episode started 6 to 12 hours ago. The problem has been resolved. Pertinent negatives include no chest pain and no headaches. The symptoms are aggravated by nothing. The symptoms are relieved by nothing.    Past Medical History  Diagnosis Date  . ARTHRITIS, HAND   . ARTHRITIS, LEFT SHOULDER   . HEARING LOSS   . SMOKER   . CEREBROVASCULAR ACCIDENT, HX OF   . HYPERTENSION   . HYPERLIPIDEMIA   . HYPOTHYROIDISM   . OVERACTIVE BLADDER     Past Surgical History  Procedure Date  . No past surgeries     Family History  Problem Relation Age of Onset  . Gout Father   . Arthritis Other   . Hypertension Other   . Kidney disease Other   . Heart disease Other   . Thyroid disease Other     History  Substance Use Topics  . Smoking status: Current Everyday Smoker -- 0.6 packs/day  . Smokeless tobacco: Not on file   Comment: Widowed, lives with dtr. retired from [employer] ([employer])  . Alcohol Use: No    OB History    Grav Para Term Preterm Abortions TAB SAB Ect Mult Living                  Review of Systems  Constitutional: Negative.   HENT: Negative.   Eyes: Negative.   Respiratory: Negative.   Cardiovascular: Positive for syncope. Negative for chest pain.  Gastrointestinal:  Negative.   Genitourinary: Negative.   Musculoskeletal: Negative.   Skin: Negative.   Neurological: Positive for syncope and light-headedness. Negative for weakness and headaches.  Psychiatric/Behavioral: Negative.     Allergies  Benadryl allergy and Penicillins  Home Medications   Current Outpatient Rx  Name Route Sig Dispense Refill  . BENICAR HCT 40-12.5 MG PO TABS  TAKE 1 TABLET BY MOUTH DAILY 30 tablet 5  . LEVOTHYROXINE SODIUM 175 MCG PO TABS Oral Take 1 tablet (175 mcg total) by mouth daily. 30 tablet 3  . PROPRANOLOL HCL ER 80 MG PO CP24 Oral Take 1 capsule (80 mg total) by mouth daily. 30 capsule 11  . SIMVASTATIN 20 MG PO TABS  1 BY MOUTH AT  BEDTIME 30 tablet 5  . TRIAMCINOLONE ACETONIDE 0.1 % EX LOTN  Apply twice daily to affected skin as needed 60 mL 0    BP 193/87  Pulse 67  Resp 18  SpO2 100%  Physical Exam  Nursing note and vitals reviewed. Constitutional: She is oriented to person, place, and time. She appears well-developed and well-nourished.  HENT:  Head: Normocephalic. Hair is normal.    Right Ear: Tympanic membrane normal.  Left Ear: Tympanic membrane normal.  Mouth/Throat: Uvula is midline, oropharynx is clear and moist and mucous membranes are normal.  Eyes: Conjunctivae, EOM and lids are normal. Pupils are equal, round, and reactive to light.  Neck: Normal range of motion.  Cardiovascular: Regular rhythm, S1 normal, S2 normal and normal heart sounds.  Bradycardia present.   No murmur heard.      ECG: sinus bradycardia, rate 48, no ST-T wave abnormalities  Pulmonary/Chest: Effort normal and breath sounds normal. She has no decreased breath sounds. She has no wheezes. She has no rhonchi.  Musculoskeletal: Normal range of motion.  Neurological: She is alert and oriented to person, place, and time.  Skin: Skin is warm and dry.  Psychiatric: Her behavior is normal.    ED Course  Procedures (including critical care time)  Labs Reviewed - No data to display No results found.   1. Syncope       MDM  Transferred to ED for further evaluation.        Richardo Priest, MD 12/03/11 1244

## 2011-12-03 NOTE — Discharge Instructions (Signed)
Transferred to [facility] Emergency Department for further evaluation of syncope.

## 2011-12-03 NOTE — ED Notes (Signed)
Passed out this am she got dizzy  Went to ucc had ekg there sent for tests

## 2011-12-03 NOTE — ED Notes (Signed)
PT HERE FOR EXAM S/P FALL @ 330 AM.PT STATES SHE WAS WALKING OUT THE KITCHEN AND BECAME DIZZY AND FELL ON TOP OF HEAD.NO LOC OR VOMITING REPORTED.PT STATES SHE WAS ABLE TO PULL HERSELF UNTO THE COUCH AND SX RESOLVED.PT HAS PREVIOUS SCABBING TO TOP OF HEAD.NO BLEEDING OR BRUISING SEEN BUT MIN SCABBED OFF.DENIES H/A.PT HAS HX HTN BUT DIDN'T TAKE MEDS TODAY

## 2011-12-03 NOTE — ED Provider Notes (Signed)
Medical screening examination/treatment/procedure(s) were conducted as a shared visit with non-physician practitioner(s) and myself.  I personally evaluated the patient during the encounter. The current episode of syncope. She was scheduled for a echocardiogram today. She's had some bradycardia on the monitor here. Sinus. She'll be admitted. She appears well  Erika Rivera. Rubin Payor, MD 12/03/11 [phone]

## 2011-12-03 NOTE — H&P (Signed)
PATIENT DETAILS Name: Erika Rivera Age: 76 y.o. Sex: female Date of Birth: [birth year] Admit Date: 12/03/2011 [id number] Erika Coyer, MD, MD   CHIEF COMPLAINT:  Syncope  HPI: Patient is a 76 year old African American female with a past medical history of hypertension, dyslipidemia, hypothyroidism who was brought to the ED for further evaluation after a syncopal episode. Apparently this patient has had 4 syncopal episodes in the past 2 and half months, her last syncopal episode was this morning and the previous one before that was last Sunday. Apparently early this morning, patient had a syncopal episode and last thing she remembered was getting lightheaded and she found herself on the floor, she was easily able to get up and ask for help from her daughter. Her daughter noticed that her scalp most likely bleeding and brought her to the urgent care The urgent care then referred her to the ED for further evaluation. Her primary care practitioner is working up this patient for recommend syncopal episode, patient has had a nuclear stress test done earlier this month which was negative. She was supposed to have a echocardiogram done today however she ended up in the hospital. Patient claims that she feels somewhat lightheaded prior to these episodes, she is a poor historian and is not able to tell me further. Her daughter has witnessed one of these syncopal episodes, she describes no seizure-like activity no urinary or bowel incontinence. There has been no tongue biting. She is now being admitted to the hospitalist service for further evaluation and treatment.   ALLERGIES:   Allergies  Allergen Reactions  . Benadryl Allergy   . Penicillins     PAST MEDICAL HISTORY: Past Medical History  Diagnosis Date  . ARTHRITIS, HAND   . ARTHRITIS, LEFT SHOULDER   . HEARING LOSS   . SMOKER   . CEREBROVASCULAR ACCIDENT, HX OF   . HYPERTENSION   . HYPERLIPIDEMIA   . HYPOTHYROIDISM   . OVERACTIVE BLADDER      PAST SURGICAL HISTORY: Past Surgical History  Procedure Date  . No past surgeries     MEDICATIONS AT HOME: Prior to Admission medications   Medication Sig Start Date End Date Taking? Authorizing Provider  levothyroxine (SYNTHROID, LEVOTHROID) 175 MCG tablet Take 1 tablet (175 mcg total) by mouth daily. 10/22/11 10/21/12 Yes Romero Belling, MD  olmesartan-hydrochlorothiazide (BENICAR HCT) 40-12.5 MG per tablet Take 1 tablet by mouth daily.   Yes Historical Provider, MD  propranolol (INDERAL LA) 80 MG 24 hr capsule Take 1 capsule (80 mg total) by mouth daily. 04/23/11 04/22/12 Yes Rene Paci, MD  simvastatin (ZOCOR) 20 MG tablet Take 20 mg by mouth every evening.   Yes Historical Provider, MD  triamcinolone lotion (KENALOG) 0.1 % Apply 1 application topically 2 (two) times daily as needed. For dry skin / itching   Yes Historical Provider, MD    FAMILY HISTORY: Family History  Problem Relation Age of Onset  . Gout Father   . Arthritis Other   . Hypertension Other   . Kidney disease Other   . Heart disease Other   . Thyroid disease Other     SOCIAL HISTORY:  reports that she has been smoking.  She does not have any smokeless tobacco history on file. She reports that she does not drink alcohol or use illicit drugs.  REVIEW OF SYSTEMS:  Constitutional:   No  weight loss, night sweats,  Fevers, chills, fatigue.  HEENT:    No headaches, Difficulty swallowing,Tooth/dental problems,Sore throat,  No  sneezing, itching, ear ache, nasal congestion, post nasal drip,   Cardio-vascular: No chest pain,  Orthopnea, PND, swelling in lower extremities, anasarca, dizziness, palpitations  GI:  No heartburn, indigestion, abdominal pain, nausea, vomiting, diarrhea, change in bowel habits, loss of appetite  Resp: No shortness of breath with exertion or at rest.  No excess mucus, no productive cough, No non-productive cough,  No coughing up of blood.No change in color of mucus.No wheezing.No  chest wall deformity  Skin:  no rash or lesions.  GU:  no dysuria, change in color of urine, no urgency or frequency.  No flank pain.  Musculoskeletal: No joint pain or swelling.  No decreased range of motion.  No back pain.  Psych: No change in mood or affect. No depression or anxiety.  No memory loss.   PHYSICAL EXAM: Blood pressure 184/105, pulse 50, temperature 97.4 F (36.3 C), temperature source Oral, resp. rate 11, SpO2 99.00%.  General appearance :Awake, alert, not in any distress. Speech Clear. Not toxic Looking HEENT: Atraumatic and Normocephalic, pupils equally reactive to light and accomodation Neck: supple, no JVD. No cervical lymphadenopathy.  Chest:Good air entry bilaterally, no added sounds  CVS: S1 S2 regular, no murmurs.  Abdomen: Bowel sounds present, Non tender and not distended with no gaurding, rigidity or rebound. Extremities: B/L Lower Ext shows no edema, both legs are warm to touch, with  dorsalis pedis pulses palpable. Neurology: Awake alert, and oriented X 3, CN II-XII intact, Non focal Skin:No Rash Wounds:N/A  LABS ON ADMISSION:   Basename 12/03/11 1538  NA 138  K 3.8  CL 107  CO2 22  GLUCOSE 70  BUN 18  CREATININE 1.21*  CALCIUM 10.5  MG --  PHOS --   No results found for this basename: AST:2,ALT:2,ALKPHOS:2,BILITOT:2,PROT:2,ALBUMIN:2 in the last 72 hours No results found for this basename: LIPASE:2,AMYLASE:2 in the last 72 hours  Basename 12/03/11 1538  WBC 6.6  NEUTROABS 3.4  HGB 11.6*  HCT 35.2*  MCV 94.1  PLT 227   No results found for this basename: CKTOTAL:3,CKMB:3,CKMBINDEX:3,TROPONINI:3 in the last 72 hours No results found for this basename: DDIMER:2 in the last 72 hours No components found with this basename: POCBNP:3   RADIOLOGIC STUDIES ON ADMISSION: No results found.  ASSESSMENT AND PLAN: Present on Admission:  .Syncope -Admit to telemetry, she does appear to have sinus bradycardia I do not think she is  currently symptomatic from this  -We will admitted to the telemetry unit -Get a 2-D echocardiogram -She may need a cardiology evaluation prior to her discharge, however if nothing eventful shows up on a telemetry monitor, she may need an outpatient event monitor to be set up. -I not think this is seizures, however the EEG will be ordered. -A CTA of the head and neck has been done in the ED, preliminary results indicate no acute abnormalities. 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, CTA of the head does not show any recurrence  .Bradycardia -I will hold her beta blocker  -I do not think that this is the cause of the syncope, however we will know more depending on heart rate while she is on the telemetry monitor  -For now we will discontinue her beta blocker and follow her clinical course  -We will check a TSH, she is on thyroid supplementation   .HYPOTHYROIDISM -Continue with levothyroxine  -Check TSH given bradycardia   .HYPERTENSION -Hold propanolol, continue with Benicar and hydrochlorothiazide   .HYPERLIPIDEMIA -Continue with  statins  Further plan will depend as patient's clinical course evolves and further radiologic and laboratory data become available. Patient will be monitored closely.  DVT Prophylaxis:  prophylactic Lovenox  Code Status: Full code  Total time spent for admission equals 45 minutes.  Jeoffrey Massed 12/03/2011, 10:02 PM

## 2011-12-03 NOTE — ED Notes (Signed)
Call daughter [phone] for information about patient or if they decide to more test

## 2011-12-03 NOTE — ED Provider Notes (Signed)
History     CSN: [id number]  Arrival date & time 12/03/11  1254   First MD Initiated Contact with Patient 12/03/11 1511      Chief Complaint  Patient presents with  . Fall    (Consider location/radiation/quality/duration/timing/severity/associated sxs/prior treatment) HPI  Patient presents to the ER with her daughter at bedside with whom she lives after going to the [facility] to be evaluated for syncope but was sent to ER for further evaluation. She reports that this is the second time that she has passed out this week. But states this is the fourth time in a 2-3 month period. Though other episodes were during different hours of the day, they presented in similar fashion. She reports that she woke up around 3:30 this morning to go to the bathroom, became dizzy, fell forward, striking her head against the door frame. She reports that she woke up on the floor after an unknown amount of time but believes it was a brief amount of time. This fall was not witnessed. After the fall, she reports that she was unable to get up immediately and had to drag herself to the couch to get up and then fell back asleep on the cough until morning. She denies any injury or residual pain from the fall. She denies any vision changes, nausea, vomiting. She denies any chest pain or shortness of breath. She reports a second time that she passed out this week, which occurred on Sunday. This occurred while she was in the bathroom and was unable to get up and into the door when her daughter was knocking. Her daughter reports that this is the fourth or fifth episode of syncope. Over the last 2 months. Her daughter reports that she started propanolol over the last 3 months. Her daughter also reports that this syncopal episodes have been worked up recently, with a head CT, and a stress test. She was supposed to have an echocardiogram done today, but missed that appointment secondary to presenting to [facility] for evaluation for syncope. she  has a past medical history significant for hypertension and a stroke with daughter reporting an aneurysm with stroke approximately 20 years ago but has limited information regarding this. EKG at the urgent care Center shows sinus bradycardia with a heart rate of 48. Otherwise no ST or T changes. Exam is otherwise unremarkable. She is neurologically intact. Patient and daughter state that the patient has hx of Lupus and has "thick scabbed skin all over scalp after Lupus and a bunch of medications" stating that when patient hit her head, it broke the scab but denies any bleeding or additional break in skin. Patient denies aggravating or alleviating factors. Symptoms are acute onset and resolved. Patient states that since waking this morning she has felt well, without complaint.    Past Medical History  Diagnosis Date  . ARTHRITIS, HAND   . ARTHRITIS, LEFT SHOULDER   . HEARING LOSS   . SMOKER   . CEREBROVASCULAR ACCIDENT, HX OF   . HYPERTENSION   . HYPERLIPIDEMIA   . HYPOTHYROIDISM   . OVERACTIVE BLADDER     Past Surgical History  Procedure Date  . No past surgeries     Family History  Problem Relation Age of Onset  . Gout Father   . Arthritis Other   . Hypertension Other   . Kidney disease Other   . Heart disease Other   . Thyroid disease Other     History  Substance Use Topics  .  Smoking status: Current Everyday Smoker -- 0.6 packs/day  . Smokeless tobacco: Not on file   Comment: Widowed, lives with dtr. retired from [employer] and [employer]  . Alcohol Use: No    OB History    Grav Para Term Preterm Abortions TAB SAB Ect Mult Living                  Review of Systems  All other systems reviewed and are negative.    Allergies  Benadryl allergy and Penicillins  Home Medications   Current Outpatient Rx  Name Route Sig Dispense Refill  . LEVOTHYROXINE SODIUM 175 MCG PO TABS Oral Take 1 tablet (175 mcg total) by mouth daily. 30 tablet 3  . OLMESARTAN  MEDOXOMIL-HCTZ 40-12.5 MG PO TABS Oral Take 1 tablet by mouth daily.    Marland Kitchen PROPRANOLOL HCL ER 80 MG PO CP24 Oral Take 1 capsule (80 mg total) by mouth daily. 30 capsule 11  . SIMVASTATIN 20 MG PO TABS Oral Take 20 mg by mouth every evening.    . TRIAMCINOLONE ACETONIDE 0.1 % EX LOTN Topical Apply 1 application topically 2 (two) times daily as needed. For dry skin / itching      BP 184/73  Pulse 50  Temp(Src) 97.5 F (36.4 C) (Oral)  Resp 18  SpO2 99%  Physical Exam  Nursing note and vitals reviewed. Constitutional: She is oriented to person, place, and time. She appears well-developed and well-nourished. No distress.  HENT:  Head: Normocephalic and atraumatic.  Eyes: Conjunctivae and EOM are normal. Pupils are equal, round, and reactive to light.  Neck: Normal range of motion. Neck supple.       No auditory carotid bruit  Cardiovascular: Normal rate, regular rhythm, normal heart sounds and intact distal pulses.  Exam reveals no gallop and no friction rub.   No murmur heard. Pulmonary/Chest: Effort normal and breath sounds normal. No respiratory distress. She has no wheezes. She has no rales. She exhibits no tenderness.  Abdominal: Soft. Bowel sounds are normal. She exhibits no distension and no mass. There is no tenderness. There is no rebound and no guarding.  Musculoskeletal: Normal range of motion. She exhibits no edema and no tenderness.  Neurological: She is alert and oriented to person, place, and time. She has normal reflexes. No cranial nerve deficit. Coordination normal.  Skin: Skin is warm and dry. No rash noted. She is not diaphoretic. No erythema.       Thickened hyperkeratotic skin of entire scalp with cracking in superior aspect but hemostatic. No underlying hematoma.   Psychiatric: She has a normal mood and affect.    ED Course  Procedures (including critical care time)  Saline lock placed  Prior PCP office records reviewed along with prior imaging by myself and Dr.  Rubin Payor  4:02 PM Patient's HR will decrease temporarily into 30s while sitting in ER but is asymptomatic to events.    Date: 12/03/2011  Rate: 43  Rhythm: sinus brady rhythm  QRS Axis: LVH, anterior Q waves  Intervals: normal  ST/T Wave abnormalities: normal  Conduction Disutrbances: none  Narrative Interpretation:   Old EKG Reviewed: ongoing brady compared to 10/22/11     Labs Reviewed  CBC - Abnormal; Notable for the following:    RBC 3.74 (*)    Hemoglobin 11.6 (*)    HCT 35.2 (*)    RDW 15.6 (*)    All other components within normal limits  BASIC METABOLIC PANEL - Abnormal; Notable for  the following:    Creatinine, Ser 1.21 (*)    GFR calc non Af Amer 43 (*)    GFR calc Af Amer 50 (*)    All other components within normal limits  URINALYSIS, ROUTINE W REFLEX MICROSCOPIC - Abnormal; Notable for the following:    Specific Gravity, Urine 1.003 (*)    All other components within normal limits  DIFFERENTIAL  GLUCOSE, CAPILLARY  POCT I-STAT TROPONIN I   No results found.   1. Syncope   2. Bradycardia       MDM  [facility] team #3 to admit. Patient is resting comfortably on monitor without complaint.         Jenness Corner, [place] 12/03/11 2141

## 2011-12-04 ENCOUNTER — Ambulatory Visit (HOSPITAL_COMMUNITY): Payer: Medicare Other

## 2011-12-04 ENCOUNTER — Other Ambulatory Visit: Payer: Self-pay

## 2011-12-04 DIAGNOSIS — R55 Syncope and collapse: Secondary | ICD-10-CM

## 2011-12-04 LAB — CARDIAC PANEL(CRET KIN+CKTOT+MB+TROPI)
CK, MB: 1.9 ng/mL (ref 0.3–4.0)
CK, MB: 1.9 ng/mL (ref 0.3–4.0)
Relative Index: 2.1 (ref 0.0–2.5)
Relative Index: INVALID (ref 0.0–2.5)
Total CK: 85 U/L (ref 7–177)
Total CK: 127 U/L (ref 7–177)
Troponin I: <0.3 ng/mL (ref <0.30)
Troponin I: <0.3 ng/mL (ref <0.30)

## 2011-12-04 LAB — CBC
Hemoglobin: 10.9 g/dL — ABNORMAL LOW (ref 12.0–15.0)
MCH: 30.7 pg (ref 26.0–34.0)
MCHC: 32.8 g/dL (ref 30.0–36.0)
RDW: 15.5 % (ref 11.5–15.5)

## 2011-12-04 LAB — BASIC METABOLIC PANEL
Calcium: 10.2 mg/dL (ref 8.4–10.5)
GFR calc Af Amer: 46 mL/min — ABNORMAL LOW (ref >90)
GFR calc non Af Amer: 40 mL/min — ABNORMAL LOW (ref >90)
Glucose, Bld: 75 mg/dL (ref 70–99)
Potassium: 3.4 mEq/L — ABNORMAL LOW (ref 3.5–5.1)
Sodium: 142 mEq/L (ref 135–145)

## 2011-12-04 LAB — MAGNESIUM: Magnesium: 2 mg/dL (ref 1.5–2.5)

## 2011-12-04 LAB — CREATININE, SERUM: GFR calc Af Amer: 54 mL/min — ABNORMAL LOW (ref >90)

## 2011-12-04 NOTE — Progress Notes (Signed)
Patient had a 15 beats of Torsades Erika Rivera this morning @ 0800, notified Dr. Robb Matar, BP 161/77, HR 69, O2 94% RA. EKG done. Then @ 1745 she had another 28 beats of Torsades de Erika Rivera, patient reported feeling "swimmy headed" BP 165/84 HR 66 and O2 97% on 2 L [place]. Ward Givens, NP was paged and he stated that he will talk with Dr. Ladona Ridgel who saw her earlier. 1806 patient reports feeling better. EKG shows NSR with frequent PVC's. Bernita Raisin, RN

## 2011-12-04 NOTE — Consult Note (Signed)
Erika Rivera smokes about 1/2 ppd and says she's thinking about quitting but not ready to do it at this time. Encouaged and advised pt to quit. Referred to 1-800 quit now for f/u and support. Discussed oral fixation substitutes, second hand smoke and in home smoking policy. Reviewed and gave pt Written education/contact information.

## 2011-12-04 NOTE — Consult Note (Signed)
Reason for Consult:syncope  Referring Physician: Dr. Elyn Aquas is an 76 y.o. female.   HPI: The patient is a very pleasant 77 year old woman with a history of recurrent syncope. She also has cerebrovascular disease, hypertension, and dyslipidemia. The patient's history is difficult to obtain but apparently for the last 2-3 months she has had recurrent episodes of syncope. These occur suddenly and last but a few seconds. She has not injured herself. Until now, there has been no obvious etiology. She denies associated chest pain or shortness of breath. There is no family history of sudden cardiac death. She denies heart failure symptoms or peripheral edema. No fevers or chills. No deafness.  PMH: Past Medical History  Diagnosis Date  . ARTHRITIS, HAND   . ARTHRITIS, LEFT SHOULDER   . HEARING LOSS   . SMOKER   . CEREBROVASCULAR ACCIDENT, HX OF   . HYPERTENSION   . HYPERLIPIDEMIA   . HYPOTHYROIDISM   . OVERACTIVE BLADDER     PSHX: Past Surgical History  Procedure Date  . No past surgeries     FAMHX: Family History  Problem Relation Age of Onset  . Gout Father   . Arthritis Other   . Hypertension Other   . Kidney disease Other   . Heart disease Other   . Thyroid disease Other     Social History:  reports that she has been smoking.  She does not have any smokeless tobacco history on file. She reports that she does not drink alcohol or use illicit drugs.  Allergies:  Allergies  Allergen Reactions  . Benadryl Allergy   . Penicillins     Ct Angio Head W/cm &/or Wo Cm  12/04/2011  *RADIOLOGY REPORT*  Clinical Data:  76 year old female with syncope, head trauma. History of intracranial aneurysm clipping.  CT ANGIOGRAPHY HEAD AND NECK  Technique:  Multidetector CT imaging of the head and neck was performed using the standard protocol during bolus administration of intravenous contrast.  Multiplanar CT image reconstructions including MIPs were obtained to evaluate  the vascular anatomy. Carotid stenosis measurements (when applicable) are obtained utilizing NASCET criteria, using the distal internal carotid diameter as the denominator.  Contrast: 50mL OMNIPAQUE IOHEXOL 350 MG/ML IV SOLN  Comparison:  Head CT without contrast 10/27/1937.  CTA NECK  Findings:  Retained secretions in the trachea.  Minor dependent atelectasis in the lung apices.  No superior mediastinal lymphadenopathy.  Thyroid, oropharynx, nasopharynx, parapharyngeal spaces, retropharyngeal space, sublingual space, submandibular glands and parotid glands are within normal limits.  Asymmetry of the left vallecula and piriform sinus is noted without discrete mass.  No cervical lymphadenopathy.  Cervical spine degenerative changes. Visualized paranasal sinuses and mastoids are clear.  Mild upper thoracic spondylolisthesis. No acute osseous abnormality identified.  Visualized orbit soft tissues are within normal limits.  Vascular Findings: Moderate to severe arch atherosclerosis.  Bovine arch configuration.  No common carotid artery origin stenosis.  No significant subclavian artery origin stenosis.  Tortuous right common carotid artery.  Mild right carotid bifurcation and proximal right ICA atherosclerosis.  No cervical right ICA stenosis.  Tortuosity is noted.  Right vertebral artery origin is normal.  Tortuosity of the right vertebral artery throughout the neck which remains patent without stenosis to the skull base.  Minimal atherosclerosis at the left carotid bifurcation.  Mild left ICA bulb atherosclerosis.  Tortuous left ICA with a kinked appearance (sagittal images 102, 97) but otherwise no cervical left ICA stenosis.  Left vertebral artery origin is  within normal limits.  Tortuous left vertebral artery throughout the neck is patent to the skull base without stenosis.   Review of the MIP images confirms the above findings.  IMPRESSION: 1.  Tortuosity of the cervical ICA resulting in a kinked appearance on the  left.  No significant atherosclerotic carotid stenosis in the neck. 2.  Tortuous vertebral artery without significant stenosis. 3.  Intracranial findings are below. 4.  Retained secretions in the trachea.  Asymmetric appearance of the left vallecula and piriform sinus may also reflect retained secretions.  CTA HEAD  Findings:  Remote osseous changes of left frontotemporal craniotomy. No acute osseous abnormality identified.  Visualized scalp soft tissues are within normal limits.  Streak artifact from left ICA terminus region aneurysm clip.  Partially empty sella configuration.  Left anterior temporal and frontal lobe chronic encephalomalacia. Small area of cortical encephalomalacia in the posterior left operculum (series 2 image 15).  No ventriculomegaly. No midline shift, mass effect, or evidence of mass lesion.  Small chronic lacunar infarct in the left cerebellar hemisphere. No evidence of cortically based acute infarction identified.  No acute intracranial hemorrhage identified.  No abnormal enhancement identified.  Vascular Findings: Major intracranial venous structures are enhancing.  Patent distal vertebral arteries.  Patent bilateral PICA vessels. Vertebrobasilar junction is within normal limits.  Mild basilar artery irregularity and no basilar stenosis.  Superior cerebellar artery origins are within normal limits.  Left greater than right fetal type PCA origins.  Bilateral PCA branches are within normal limits.  Right ICA siphon is patent without atherosclerosis.  Right ophthalmic and posterior communicating artery origins are within normal limits.  Nondominant right ACA A1 segment.  Right ICA terminus otherwise is normal.  Normal right MCA origin.  Right MCA branches are within normal limits.  The left ICA siphon is patent and asymmetrically larger than the right.  There is mild left ICA calcified atherosclerosis.  There is an inferiorly directed saccular aneurysm arising from the proximal supraclinoid  segment measuring 2 x 2 x 3 mm (sagittal image 91, 1 mm axial image 105).  This is proximal to the aneurysm clip.  Its office at the left ophthalmic artery origin which is within normal limits. The streak artifact from the aneurysm clip partially obscures the left posterior communicating artery origin.  That artery remains patent.  No aneurysm enhancement at the level of the clip is identified.  The left ICA terminus otherwise is normal.  Left MCA and ACA origins are normal.  The left ACA A1 segment is dominant.  Normal anterior communicating artery.  ACA branches are within normal limits; small infundibulum at a left lenticulostriate branch origin is noted.  Left MCA branches are normal except for a decrease in left MCA anterior division flow compared to the right.   Review of the MIP images confirms the above findings.  IMPRESSION: 1.  Small 2 x 2 x 3 mm saccular left ICA aneurysm arises off the tip to the left ophthalmic artery origin and is directed inferiorly.  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 2.  No distal left ICA aneurysm recurrence detected by CTA status post aneurysm clipping. 3.  No intracranial artery stenosis.  Mild ICA siphon and basilar artery atherosclerosis. 4.  Chronic small medium size vessel ischemia.  Study discussed by telephone with Dr. David Stall on 12/04/2011 at 0820 hours.  Original Report Authenticated By: Harley Hallmark, M.D.   Dg Chest 2 View  12/03/2011  *RADIOLOGY REPORT*  Clinical  Data: Syncope; leg swelling.  CHEST - 2 VIEW  Comparison: None.  Findings: The lungs are well-aerated and clear.  There is no evidence of focal opacification, pleural effusion or pneumothorax.  The heart is borderline normal in size; calcification is noted within the aortic arch.  No acute osseous abnormalities are seen. Mild degenerative change is noted along the thoracic spine; nodular density overlying the mid thoracic spine on the lateral view is thought to reflect  lateral osteophytes.  IMPRESSION: No acute cardiopulmonary process seen.  Original Report Authenticated By: Tonia Ghent, M.D.   Ct Angio Neck W/cm &/or Wo/cm  12/04/2011  *RADIOLOGY REPORT*  Clinical Data:  76 year old female with syncope, head trauma. History of intracranial aneurysm clipping.  CT ANGIOGRAPHY HEAD AND NECK  Technique:  Multidetector CT imaging of the head and neck was performed using the standard protocol during bolus administration of intravenous contrast.  Multiplanar CT image reconstructions including MIPs were obtained to evaluate the vascular anatomy. Carotid stenosis measurements (when applicable) are obtained utilizing NASCET criteria, using the distal internal carotid diameter as the denominator.  Contrast: 50mL OMNIPAQUE IOHEXOL 350 MG/ML IV SOLN  Comparison:  Head CT without contrast 10/27/1937.  CTA NECK  Findings:  Retained secretions in the trachea.  Minor dependent atelectasis in the lung apices.  No superior mediastinal lymphadenopathy.  Thyroid, oropharynx, nasopharynx, parapharyngeal spaces, retropharyngeal space, sublingual space, submandibular glands and parotid glands are within normal limits.  Asymmetry of the left vallecula and piriform sinus is noted without discrete mass.  No cervical lymphadenopathy.  Cervical spine degenerative changes. Visualized paranasal sinuses and mastoids are clear.  Mild upper thoracic spondylolisthesis. No acute osseous abnormality identified.  Visualized orbit soft tissues are within normal limits.  Vascular Findings: Moderate to severe arch atherosclerosis.  Bovine arch configuration.  No common carotid artery origin stenosis.  No significant subclavian artery origin stenosis.  Tortuous right common carotid artery.  Mild right carotid bifurcation and proximal right ICA atherosclerosis.  No cervical right ICA stenosis.  Tortuosity is noted.  Right vertebral artery origin is normal.  Tortuosity of the right vertebral artery throughout the neck  which remains patent without stenosis to the skull base.  Minimal atherosclerosis at the left carotid bifurcation.  Mild left ICA bulb atherosclerosis.  Tortuous left ICA with a kinked appearance (sagittal images 102, 97) but otherwise no cervical left ICA stenosis.  Left vertebral artery origin is within normal limits.  Tortuous left vertebral artery throughout the neck is patent to the skull base without stenosis.   Review of the MIP images confirms the above findings.  IMPRESSION: 1.  Tortuosity of the cervical ICA resulting in a kinked appearance on the left.  No significant atherosclerotic carotid stenosis in the neck. 2.  Tortuous vertebral artery without significant stenosis. 3.  Intracranial findings are below. 4.  Retained secretions in the trachea.  Asymmetric appearance of the left vallecula and piriform sinus may also reflect retained secretions.  CTA HEAD  Findings:  Remote osseous changes of left frontotemporal craniotomy. No acute osseous abnormality identified.  Visualized scalp soft tissues are within normal limits.  Streak artifact from left ICA terminus region aneurysm clip.  Partially empty sella configuration.  Left anterior temporal and frontal lobe chronic encephalomalacia. Small area of cortical encephalomalacia in the posterior left operculum (series 2 image 15).  No ventriculomegaly. No midline shift, mass effect, or evidence of mass lesion.  Small chronic lacunar infarct in the left cerebellar hemisphere. No evidence of cortically based acute infarction  identified.  No acute intracranial hemorrhage identified.  No abnormal enhancement identified.  Vascular Findings: Major intracranial venous structures are enhancing.  Patent distal vertebral arteries.  Patent bilateral PICA vessels. Vertebrobasilar junction is within normal limits.  Mild basilar artery irregularity and no basilar stenosis.  Superior cerebellar artery origins are within normal limits.  Left greater than right fetal type PCA  origins.  Bilateral PCA branches are within normal limits.  Right ICA siphon is patent without atherosclerosis.  Right ophthalmic and posterior communicating artery origins are within normal limits.  Nondominant right ACA A1 segment.  Right ICA terminus otherwise is normal.  Normal right MCA origin.  Right MCA branches are within normal limits.  The left ICA siphon is patent and asymmetrically larger than the right.  There is mild left ICA calcified atherosclerosis.  There is an inferiorly directed saccular aneurysm arising from the proximal supraclinoid segment measuring 2 x 2 x 3 mm (sagittal image 91, 1 mm axial image 105).  This is proximal to the aneurysm clip.  Its office at the left ophthalmic artery origin which is within normal limits. The streak artifact from the aneurysm clip partially obscures the left posterior communicating artery origin.  That artery remains patent.  No aneurysm enhancement at the level of the clip is identified.  The left ICA terminus otherwise is normal.  Left MCA and ACA origins are normal.  The left ACA A1 segment is dominant.  Normal anterior communicating artery.  ACA branches are within normal limits; small infundibulum at a left lenticulostriate branch origin is noted.  Left MCA branches are normal except for a decrease in left MCA anterior division flow compared to the right.   Review of the MIP images confirms the above findings.  IMPRESSION: 1.  Small 2 x 2 x 3 mm saccular left ICA aneurysm arises off the tip to the left ophthalmic artery origin and is directed inferiorly.  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 2.  No distal left ICA aneurysm recurrence detected by CTA status post aneurysm clipping. 3.  No intracranial artery stenosis.  Mild ICA siphon and basilar artery atherosclerosis. 4.  Chronic small medium size vessel ischemia.  Study discussed by telephone with Dr. David Stall on 12/04/2011 at 0820 hours.  Original Report Authenticated  By: Ulla Potash III, M.D.    ROS  As stated in the HPI and negative for all other systems.  Physical Exam  Vitals:Blood pressure 157/77, pulse 66, temperature 98 F (36.7 C), temperature source Oral, resp. rate 18, height 5\' 5"  (1.651 m), weight 75.297 kg (166 lb), SpO2 97.00%.  Well appearing elderly woman, NAD HEENT: Unremarkable Neck:  No JVD, no thyromegally Lymphatics:  No adenopathy Back:  No CVA tenderness Lungs:  Clear with no wheezes, rales, or rhonchi. HEART:  Regular rate rhythm, no murmurs, no rubs, no clicks Abd:  Flat, positive bowel sounds, no organomegally, no rebound, no guarding Ext:  2 plus pulses, no edema, no cyanosis, no clubbing Skin:  No rashes no nodules Neuro:  CN II through XII intact, motor grossly intact Telemetry - normal sinus rhythm with PVCs and periods of polymorphic ventricular tachycardia. There is QRS alternans present on telemetry. Assessment/Plan: 1. Syncope 2. polymorphic ventricular tachycardia 3. hypertension Rec: The patient most likely has prolongation of the QT interval as the cause of her syncope due to polymorphic ventricular tachycardia. She has had several episodes lasting a few seconds. Interestingly her QT interval is not at time of long  but at other times does appear to be long. She has T wave alternans on telemetry. She has fairly marked sinus bradycardia. There is no QT prolonging drugs that I can see although she has had some hypokalemia. At this time I would recommend repletion of potassium and magnesium. She will need to undergo left heart catheterization unless it is been done in the last 2 years. If catheterization is unrevealing, dual chamber ICD implantation will be recommended as her risk of sudden cardiac death is high. I would note that the patient has been on beta blockers and has continued to have symptoms.   Sharlot Gowda TaylorMD 12/04/2011, 5:09 PM

## 2011-12-04 NOTE — Progress Notes (Signed)
Utilization review completed. Erika Rivera 12/04/2011 

## 2011-12-04 NOTE — Progress Notes (Signed)
Pt admitted with thick scab skin all over scalp,  Pt  verbalizes that it's been an ongoing issue since she was diagnosed with Lupus. Skin on BLE is dry and  scaly, toe nails are  long, thick, brown and curly.

## 2011-12-04 NOTE — Progress Notes (Addendum)
Subjective:  No compalins.  Objective: Filed Vitals:   12/03/11 2145 12/03/11 2246 12/03/11 2323 12/04/11 0816  BP: 184/105 176/86  161/77  Pulse: 50 62  69  Temp:  98.2 F (36.8 C)    TempSrc:  Oral    Resp:  19  18  Height:   5\' 5"  (1.651 m)   Weight:   75.297 kg (166 lb)   SpO2: 99% 96%  94%   Weight change:  No intake or output data in the 24 hours ending 12/04/11 1331  General: Alert, awake, oriented x3, in no acute distress.  HEENT: No bruits, no goiter.  Heart: Regular rate and rhythm, without murmurs, rubs, gallops.  Lungs: Good air movement, cta B/L Abdomen: Soft, nontender, nondistended, positive bowel sounds.  Neuro: Grossly intact, nonfocal.   Lab Results:  Basename 12/04/11 0624 12/03/11 2333 12/03/11 1538  NA 142 -- 138  K 3.4* -- 3.8  CL 109 -- 107  CO2 25 -- 22  GLUCOSE 75 -- 70  BUN 19 -- 18  CREATININE 1.29* 1.14* 1.21*  CALCIUM 10.2 -- 10.5  MG -- -- --  PHOS -- -- --   No results found for this basename: AST:2,ALT:2,ALKPHOS:2,BILITOT:2,PROT:2,ALBUMIN:2 in the last 72 hours No results found for this basename: LIPASE:2,AMYLASE:2 in the last 72 hours  Basename 12/04/11 0624 12/03/11 2333 12/03/11 1538  WBC 6.3 7.2 --  NEUTROABS -- -- 3.4  HGB 10.9* 11.9* --  HCT 33.2* 35.4* --  MCV 93.5 93.2 --  PLT 221 220 --    Basename 12/04/11 0830 12/03/11 2332  CKTOTAL 85 106  CKMB 1.9 2.2  CKMBINDEX -- --  TROPONINI <0.30 <0.30   No components found with this basename: POCBNP:3 No results found for this basename: DDIMER:2 in the last 72 hours No results found for this basename: HGBA1C:2 in the last 72 hours No results found for this basename: CHOL:2,HDL:2,LDLCALC:2,TRIG:2,CHOLHDL:2,LDLDIRECT:2 in the last 72 hours  Basename 12/04/11 0624  TSH 3.754  T4TOTAL --  T3FREE --  THYROIDAB --   No results found for this basename: VITAMINB12:2,FOLATE:2,FERRITIN:2,TIBC:2,IRON:2,RETICCTPCT:2 in the last 72 hours  Micro Results: No results found  for this or any previous visit (from the past 240 hour(s)).  Studies/Results: Ct Angio Head W/cm &/or Wo Cm  12/04/2011  *RADIOLOGY REPORT*  Clinical Data:  76 year old female with syncope, head trauma. History of intracranial aneurysm clipping.  CT ANGIOGRAPHY HEAD AND NECK  Technique:  Multidetector CT imaging of the head and neck was performed using the standard protocol during bolus administration of intravenous contrast.  Multiplanar CT image reconstructions including MIPs were obtained to evaluate the vascular anatomy. Carotid stenosis measurements (when applicable) are obtained utilizing NASCET criteria, using the distal internal carotid diameter as the denominator.  Contrast: 50mL OMNIPAQUE IOHEXOL 350 MG/ML IV SOLN  Comparison:  Head CT without contrast 10/27/1937.  CTA NECK  Findings:  Retained secretions in the trachea.  Minor dependent atelectasis in the lung apices.  No superior mediastinal lymphadenopathy.  Thyroid, oropharynx, nasopharynx, parapharyngeal spaces, retropharyngeal space, sublingual space, submandibular glands and parotid glands are within normal limits.  Asymmetry of the left vallecula and piriform sinus is noted without discrete mass.  No cervical lymphadenopathy.  Cervical spine degenerative changes. Visualized paranasal sinuses and mastoids are clear.  Mild upper thoracic spondylolisthesis. No acute osseous abnormality identified.  Visualized orbit soft tissues are within normal limits.  Vascular Findings: Moderate to severe arch atherosclerosis.  Bovine arch configuration.  No common carotid artery origin stenosis.  No significant subclavian artery origin stenosis.  Tortuous right common carotid artery.  Mild right carotid bifurcation and proximal right ICA atherosclerosis.  No cervical right ICA stenosis.  Tortuosity is noted.  Right vertebral artery origin is normal.  Tortuosity of the right vertebral artery throughout the neck which remains patent without stenosis to the skull  base.  Minimal atherosclerosis at the left carotid bifurcation.  Mild left ICA bulb atherosclerosis.  Tortuous left ICA with a kinked appearance (sagittal images 102, 97) but otherwise no cervical left ICA stenosis.  Left vertebral artery origin is within normal limits.  Tortuous left vertebral artery throughout the neck is patent to the skull base without stenosis.   Review of the MIP images confirms the above findings.  IMPRESSION: 1.  Tortuosity of the cervical ICA resulting in a kinked appearance on the left.  No significant atherosclerotic carotid stenosis in the neck. 2.  Tortuous vertebral artery without significant stenosis. 3.  Intracranial findings are below. 4.  Retained secretions in the trachea.  Asymmetric appearance of the left vallecula and piriform sinus may also reflect retained secretions.  CTA HEAD  Findings:  Remote osseous changes of left frontotemporal craniotomy. No acute osseous abnormality identified.  Visualized scalp soft tissues are within normal limits.  Streak artifact from left ICA terminus region aneurysm clip.  Partially empty sella configuration.  Left anterior temporal and frontal lobe chronic encephalomalacia. Small area of cortical encephalomalacia in the posterior left operculum (series 2 image 15).  No ventriculomegaly. No midline shift, mass effect, or evidence of mass lesion.  Small chronic lacunar infarct in the left cerebellar hemisphere. No evidence of cortically based acute infarction identified.  No acute intracranial hemorrhage identified.  No abnormal enhancement identified.  Vascular Findings: Major intracranial venous structures are enhancing.  Patent distal vertebral arteries.  Patent bilateral PICA vessels. Vertebrobasilar junction is within normal limits.  Mild basilar artery irregularity and no basilar stenosis.  Superior cerebellar artery origins are within normal limits.  Left greater than right fetal type PCA origins.  Bilateral PCA branches are within normal  limits.  Right ICA siphon is patent without atherosclerosis.  Right ophthalmic and posterior communicating artery origins are within normal limits.  Nondominant right ACA A1 segment.  Right ICA terminus otherwise is normal.  Normal right MCA origin.  Right MCA branches are within normal limits.  The left ICA siphon is patent and asymmetrically larger than the right.  There is mild left ICA calcified atherosclerosis.  There is an inferiorly directed saccular aneurysm arising from the proximal supraclinoid segment measuring 2 x 2 x 3 mm (sagittal image 91, 1 mm axial image 105).  This is proximal to the aneurysm clip.  Its office at the left ophthalmic artery origin which is within normal limits. The streak artifact from the aneurysm clip partially obscures the left posterior communicating artery origin.  That artery remains patent.  No aneurysm enhancement at the level of the clip is identified.  The left ICA terminus otherwise is normal.  Left MCA and ACA origins are normal.  The left ACA A1 segment is dominant.  Normal anterior communicating artery.  ACA branches are within normal limits; small infundibulum at a left lenticulostriate branch origin is noted.  Left MCA branches are normal except for a decrease in left MCA anterior division flow compared to the right.   Review of the MIP images confirms the above findings.  IMPRESSION: 1.  Small 2 x 2 x 3 mm saccular left ICA aneurysm  arises off the tip to the left ophthalmic artery origin and is directed inferiorly.  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 2.  No distal left ICA aneurysm recurrence detected by CTA status post aneurysm clipping. 3.  No intracranial artery stenosis.  Mild ICA siphon and basilar artery atherosclerosis. 4.  Chronic small medium size vessel ischemia.  Study discussed by telephone with Dr. David Stall on 12/04/2011 at 0820 hours.  Original Report Authenticated By: Harley Hallmark, M.D.   Dg Chest 2  View  12/03/2011  *RADIOLOGY REPORT*  Clinical Data: Syncope; leg swelling.  CHEST - 2 VIEW  Comparison: None.  Findings: The lungs are well-aerated and clear.  There is no evidence of focal opacification, pleural effusion or pneumothorax.  The heart is borderline normal in size; calcification is noted within the aortic arch.  No acute osseous abnormalities are seen. Mild degenerative change is noted along the thoracic spine; nodular density overlying the mid thoracic spine on the lateral view is thought to reflect lateral osteophytes.  IMPRESSION: No acute cardiopulmonary process seen.  Original Report Authenticated By: Tonia Ghent, M.D.   Ct Angio Neck W/cm &/or Wo/cm  12/04/2011  *RADIOLOGY REPORT*  Clinical Data:  76 year old female with syncope, head trauma. History of intracranial aneurysm clipping.  CT ANGIOGRAPHY HEAD AND NECK  Technique:  Multidetector CT imaging of the head and neck was performed using the standard protocol during bolus administration of intravenous contrast.  Multiplanar CT image reconstructions including MIPs were obtained to evaluate the vascular anatomy. Carotid stenosis measurements (when applicable) are obtained utilizing NASCET criteria, using the distal internal carotid diameter as the denominator.  Contrast: 50mL OMNIPAQUE IOHEXOL 350 MG/ML IV SOLN  Comparison:  Head CT without contrast 10/27/1937.  CTA NECK  Findings:  Retained secretions in the trachea.  Minor dependent atelectasis in the lung apices.  No superior mediastinal lymphadenopathy.  Thyroid, oropharynx, nasopharynx, parapharyngeal spaces, retropharyngeal space, sublingual space, submandibular glands and parotid glands are within normal limits.  Asymmetry of the left vallecula and piriform sinus is noted without discrete mass.  No cervical lymphadenopathy.  Cervical spine degenerative changes. Visualized paranasal sinuses and mastoids are clear.  Mild upper thoracic spondylolisthesis. No acute osseous abnormality  identified.  Visualized orbit soft tissues are within normal limits.  Vascular Findings: Moderate to severe arch atherosclerosis.  Bovine arch configuration.  No common carotid artery origin stenosis.  No significant subclavian artery origin stenosis.  Tortuous right common carotid artery.  Mild right carotid bifurcation and proximal right ICA atherosclerosis.  No cervical right ICA stenosis.  Tortuosity is noted.  Right vertebral artery origin is normal.  Tortuosity of the right vertebral artery throughout the neck which remains patent without stenosis to the skull base.  Minimal atherosclerosis at the left carotid bifurcation.  Mild left ICA bulb atherosclerosis.  Tortuous left ICA with a kinked appearance (sagittal images 102, 97) but otherwise no cervical left ICA stenosis.  Left vertebral artery origin is within normal limits.  Tortuous left vertebral artery throughout the neck is patent to the skull base without stenosis.   Review of the MIP images confirms the above findings.  IMPRESSION: 1.  Tortuosity of the cervical ICA resulting in a kinked appearance on the left.  No significant atherosclerotic carotid stenosis in the neck. 2.  Tortuous vertebral artery without significant stenosis. 3.  Intracranial findings are below. 4.  Retained secretions in the trachea.  Asymmetric appearance of the left vallecula and piriform sinus may also reflect  retained secretions.  CTA HEAD  Findings:  Remote osseous changes of left frontotemporal craniotomy. No acute osseous abnormality identified.  Visualized scalp soft tissues are within normal limits.  Streak artifact from left ICA terminus region aneurysm clip.  Partially empty sella configuration.  Left anterior temporal and frontal lobe chronic encephalomalacia. Small area of cortical encephalomalacia in the posterior left operculum (series 2 image 15).  No ventriculomegaly. No midline shift, mass effect, or evidence of mass lesion.  Small chronic lacunar infarct in the  left cerebellar hemisphere. No evidence of cortically based acute infarction identified.  No acute intracranial hemorrhage identified.  No abnormal enhancement identified.  Vascular Findings: Major intracranial venous structures are enhancing.  Patent distal vertebral arteries.  Patent bilateral PICA vessels. Vertebrobasilar junction is within normal limits.  Mild basilar artery irregularity and no basilar stenosis.  Superior cerebellar artery origins are within normal limits.  Left greater than right fetal type PCA origins.  Bilateral PCA branches are within normal limits.  Right ICA siphon is patent without atherosclerosis.  Right ophthalmic and posterior communicating artery origins are within normal limits.  Nondominant right ACA A1 segment.  Right ICA terminus otherwise is normal.  Normal right MCA origin.  Right MCA branches are within normal limits.  The left ICA siphon is patent and asymmetrically larger than the right.  There is mild left ICA calcified atherosclerosis.  There is an inferiorly directed saccular aneurysm arising from the proximal supraclinoid segment measuring 2 x 2 x 3 mm (sagittal image 91, 1 mm axial image 105).  This is proximal to the aneurysm clip.  Its office at the left ophthalmic artery origin which is within normal limits. The streak artifact from the aneurysm clip partially obscures the left posterior communicating artery origin.  That artery remains patent.  No aneurysm enhancement at the level of the clip is identified.  The left ICA terminus otherwise is normal.  Left MCA and ACA origins are normal.  The left ACA A1 segment is dominant.  Normal anterior communicating artery.  ACA branches are within normal limits; small infundibulum at a left lenticulostriate branch origin is noted.  Left MCA branches are normal except for a decrease in left MCA anterior division flow compared to the right.   Review of the MIP images confirms the above findings.  IMPRESSION: 1.  Small 2 x 2 x 3  mm saccular left ICA aneurysm arises off the tip to the left ophthalmic artery origin and is directed inferiorly.  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 2.  No distal left ICA aneurysm recurrence detected by CTA status post aneurysm clipping. 3.  No intracranial artery stenosis.  Mild ICA siphon and basilar artery atherosclerosis. 4.  Chronic small medium size vessel ischemia.  Study discussed by telephone with Dr. David Stall on 12/04/2011 at 0820 hours.  Original Report Authenticated By: Harley Hallmark, M.D.    Medications: I have reviewed the patient's current medications.  Assessment and plan: Principal Problem: 1.Syncope: Propanolol dose change about month ago. Electrolytes with in normal limits.cardia markers negative times 3, TSH with normal limits. Echo pending. Wide complex tachycardia (? Torsade [place]), will get a cards consult. For possible loop recorder as an outpatient. ECG repeated was unchanged.  2. HYPOTHYROIDISM continue treatment.  3. HYPERLIPIDEMIA Continue statins.  4. HYPERTENSION Controlled.  5. Bradycardia HR with  In 50 and 70 off propanolol. Doubt relate to syncope.     LOS: 1 day   FELIZ ORTIZ,  Gerene Nedd M.D. Pager: [phone] [facility] 12/04/2011, 1:31 PM

## 2011-12-05 DIAGNOSIS — I4729 Other ventricular tachycardia: Secondary | ICD-10-CM | POA: Diagnosis not present

## 2011-12-05 DIAGNOSIS — I517 Cardiomegaly: Secondary | ICD-10-CM

## 2011-12-05 DIAGNOSIS — I498 Other specified cardiac arrhythmias: Secondary | ICD-10-CM

## 2011-12-05 DIAGNOSIS — I472 Ventricular tachycardia: Principal | ICD-10-CM

## 2011-12-05 LAB — MRSA PCR SCREENING: MRSA by PCR: NEGATIVE

## 2011-12-05 LAB — BASIC METABOLIC PANEL
Chloride: 108 mEq/L (ref 96–112)
GFR calc Af Amer: 45 mL/min — ABNORMAL LOW (ref >90)
GFR calc non Af Amer: 39 mL/min — ABNORMAL LOW (ref >90)
Glucose, Bld: 95 mg/dL (ref 70–99)
Potassium: 3.5 mEq/L (ref 3.5–5.1)
Sodium: 142 mEq/L (ref 135–145)

## 2011-12-05 MED ORDER — POTASSIUM CHLORIDE CRYS ER 20 MEQ PO TBCR
40.0000 meq | EXTENDED_RELEASE_TABLET | Freq: Three times a day (TID) | ORAL | Status: AC
  Administered 2011-12-05 (×3): 40 meq via ORAL
  Filled 2011-12-05 (×3): qty 2

## 2011-12-05 MED ORDER — HYDRALAZINE HCL 20 MG/ML IJ SOLN
5.0000 mg | Freq: Four times a day (QID) | INTRAMUSCULAR | Status: DC | PRN
  Administered 2011-12-08: 5 mg via INTRAVENOUS
  Filled 2011-12-05 (×3): qty 0.25

## 2011-12-05 NOTE — Progress Notes (Signed)
SUBJECTIVE:   Called to see this patient with an asymptomatic long run of polymorphic vtach.  She had no symptoms related to this and denies any chest pain or SOB.  PHYSICAL EXAM Filed Vitals:   12/04/11 1742 12/04/11 1745 12/04/11 2201 12/05/11 0438  BP: 160/70 165/84 165/80 186/88  Pulse:  66 64 67  Temp:   97.1 F (36.2 C) 97.7 F (36.5 C)  TempSrc:   Oral Oral  Resp:   18 18  Height:      Weight:      SpO2: 97%  93% 94%   General:  No distress Lungs:  Clear Heart:  RRR Abdomen:  Positive bowel sounds, no rebound no guarding Extremities:  No edema  LABS: Lab Results  Component Value Date   CKTOTAL 127 12/04/2011   CKMB 1.9 12/04/2011   TROPONINI <0.30 12/04/2011   Results for orders placed during the hospital encounter of 12/03/11 (from the past 24 hour(s))  CBC     Status: Abnormal   Collection Time   12/04/11  6:24 AM      Component Value Range   WBC 6.3  4.0 - 10.5 (K/uL)   RBC 3.55 (*) 3.87 - 5.11 (MIL/uL)   Hemoglobin 10.9 (*) 12.0 - 15.0 (g/dL)   HCT 19.1 (*) 47.8 - 46.0 (%)   MCV 93.5  78.0 - 100.0 (fL)   MCH 30.7  26.0 - 34.0 (pg)   MCHC 32.8  30.0 - 36.0 (g/dL)   RDW 29.5  62.1 - 30.8 (%)   Platelets 221  150 - 400 (K/uL)  BASIC METABOLIC PANEL     Status: Abnormal   Collection Time   12/04/11  6:24 AM      Component Value Range   Sodium 142  135 - 145 (mEq/L)   Potassium 3.4 (*) 3.5 - 5.1 (mEq/L)   Chloride 109  96 - 112 (mEq/L)   CO2 25  19 - 32 (mEq/L)   Glucose, Bld 75  70 - 99 (mg/dL)   BUN 19  6 - 23 (mg/dL)   Creatinine, Ser 6.57 (*) 0.50 - 1.10 (mg/dL)   Calcium 84.6  8.4 - 10.5 (mg/dL)   GFR calc non Af Amer 40 (*) >90 (mL/min)   GFR calc Af Amer 46 (*) >90 (mL/min)  TSH     Status: Normal   Collection Time   12/04/11  6:24 AM      Component Value Range   TSH 3.754  0.350 - 4.500 (uIU/mL)  CARDIAC PANEL(CRET KIN+CKTOT+MB+TROPI)     Status: Normal   Collection Time   12/04/11  8:30 AM      Component Value Range   Total CK 85  7 - 177 (U/L)     CK, MB 1.9  0.3 - 4.0 (ng/mL)   Troponin I <0.30  <0.30 (ng/mL)   Relative Index RELATIVE INDEX IS INVALID  0.0 - 2.5   MAGNESIUM     Status: Normal   Collection Time   12/04/11  8:30 AM      Component Value Range   Magnesium 2.0  1.5 - 2.5 (mg/dL)  CARDIAC PANEL(CRET KIN+CKTOT+MB+TROPI)     Status: Normal   Collection Time   12/04/11  2:55 PM      Component Value Range   Total CK 127  7 - 177 (U/L)   CK, MB 1.9  0.3 - 4.0 (ng/mL)   Troponin I <0.30  <0.30 (ng/mL)   Relative Index 1.5  0.0 -  2.5     Intake/Output Summary (Last 24 hours) at 12/05/11 0545 Last data filed at 12/04/11 1430  Gross per 24 hour  Intake    480 ml  Output    750 ml  Net   -270 ml    ASSESSMENT AND PLAN:   1)  Polymorphic VTach/Torsades:   Long run at 0526. Seen in consultation by Dr. Ladona Ridgel yesterday with a plan for cath and probable ICD.  I will move her to the ICU and check a stat Mg and potassium level.   2) Syncope:  Likely secondary to number one.  Plan as above.    Rollene Rotunda 12/05/2011 5:45 AM

## 2011-12-05 NOTE — Progress Notes (Signed)
Subjective: No compalins.  Objective: Filed Vitals:   12/04/11 2201 12/05/11 0438 12/05/11 0700 12/05/11 0800  BP: 165/80 186/88 155/79 146/77  Pulse: 64 67 52 57  Temp: 97.1 F (36.2 C) 97.7 F (36.5 C)  98.4 F (36.9 C)  TempSrc: Oral Oral  Oral  Resp: 18 18 14 14   Height:      Weight:      SpO2: 93% 94% 96% 97%   Weight change:   Intake/Output Summary (Last 24 hours) at 12/05/11 0844 Last data filed at 12/04/11 1430  Gross per 24 hour  Intake    240 ml  Output    750 ml  Net   -510 ml    General: Alert, awake, oriented x3, in no acute distress.  HEENT: No bruits, no goiter.  Heart: Regular rate and rhythm, without murmurs, rubs, gallops.  Lungs: Good air movement, cta B/L Abdomen: Soft, nontender, nondistended, positive bowel sounds.  Neuro: Grossly intact, nonfocal.   Lab Results:  Basename 12/04/11 0830 12/04/11 0624 12/03/11 2333 12/03/11 1538  NA -- 142 -- 138  K -- 3.4* -- 3.8  CL -- 109 -- 107  CO2 -- 25 -- 22  GLUCOSE -- 75 -- 70  BUN -- 19 -- 18  CREATININE -- 1.29* 1.14* 1.21*  CALCIUM -- 10.2 -- 10.5  MG 2.0 -- -- --  PHOS -- -- -- --   No results found for this basename: AST:2,ALT:2,ALKPHOS:2,BILITOT:2,PROT:2,ALBUMIN:2 in the last 72 hours No results found for this basename: LIPASE:2,AMYLASE:2 in the last 72 hours  Basename 12/04/11 0624 12/03/11 2333 12/03/11 1538  WBC 6.3 7.2 --  NEUTROABS -- -- 3.4  HGB 10.9* 11.9* --  HCT 33.2* 35.4* --  MCV 93.5 93.2 --  PLT 221 220 --    Basename 12/04/11 1455 12/04/11 0830 12/03/11 2332  CKTOTAL 127 85 106  CKMB 1.9 1.9 2.2  CKMBINDEX -- -- --  TROPONINI <0.30 <0.30 <0.30   No components found with this basename: POCBNP:3 No results found for this basename: DDIMER:2 in the last 72 hours No results found for this basename: HGBA1C:2 in the last 72 hours No results found for this basename: CHOL:2,HDL:2,LDLCALC:2,TRIG:2,CHOLHDL:2,LDLDIRECT:2 in the last 72 hours  Basename 12/04/11 0624  TSH  3.754  T4TOTAL --  T3FREE --  THYROIDAB --   No results found for this basename: VITAMINB12:2,FOLATE:2,FERRITIN:2,TIBC:2,IRON:2,RETICCTPCT:2 in the last 72 hours  Micro Results: Recent Results (from the past 240 hour(s))  MRSA PCR SCREENING     Status: Normal   Collection Time   12/05/11  6:45 AM      Component Value Range Status Comment   MRSA by PCR NEGATIVE  NEGATIVE  Final     Studies/Results: Ct Angio Head W/cm &/or Wo Cm  12/04/2011  *RADIOLOGY REPORT*  Clinical Data:  76 year old female with syncope, head trauma. History of intracranial aneurysm clipping.  CT ANGIOGRAPHY HEAD AND NECK  Technique:  Multidetector CT imaging of the head and neck was performed using the standard protocol during bolus administration of intravenous contrast.  Multiplanar CT image reconstructions including MIPs were obtained to evaluate the vascular anatomy. Carotid stenosis measurements (when applicable) are obtained utilizing NASCET criteria, using the distal internal carotid diameter as the denominator.  Contrast: 50mL OMNIPAQUE IOHEXOL 350 MG/ML IV SOLN  Comparison:  Head CT without contrast 10/27/1937.  CTA NECK  Findings:  Retained secretions in the trachea.  Minor dependent atelectasis in the lung apices.  No superior mediastinal lymphadenopathy.  Thyroid, oropharynx, nasopharynx, parapharyngeal  spaces, retropharyngeal space, sublingual space, submandibular glands and parotid glands are within normal limits.  Asymmetry of the left vallecula and piriform sinus is noted without discrete mass.  No cervical lymphadenopathy.  Cervical spine degenerative changes. Visualized paranasal sinuses and mastoids are clear.  Mild upper thoracic spondylolisthesis. No acute osseous abnormality identified.  Visualized orbit soft tissues are within normal limits.  Vascular Findings: Moderate to severe arch atherosclerosis.  Bovine arch configuration.  No common carotid artery origin stenosis.  No significant subclavian artery  origin stenosis.  Tortuous right common carotid artery.  Mild right carotid bifurcation and proximal right ICA atherosclerosis.  No cervical right ICA stenosis.  Tortuosity is noted.  Right vertebral artery origin is normal.  Tortuosity of the right vertebral artery throughout the neck which remains patent without stenosis to the skull base.  Minimal atherosclerosis at the left carotid bifurcation.  Mild left ICA bulb atherosclerosis.  Tortuous left ICA with a kinked appearance (sagittal images 102, 97) but otherwise no cervical left ICA stenosis.  Left vertebral artery origin is within normal limits.  Tortuous left vertebral artery throughout the neck is patent to the skull base without stenosis.   Review of the MIP images confirms the above findings.  IMPRESSION: 1.  Tortuosity of the cervical ICA resulting in a kinked appearance on the left.  No significant atherosclerotic carotid stenosis in the neck. 2.  Tortuous vertebral artery without significant stenosis. 3.  Intracranial findings are below. 4.  Retained secretions in the trachea.  Asymmetric appearance of the left vallecula and piriform sinus may also reflect retained secretions.  CTA HEAD  Findings:  Remote osseous changes of left frontotemporal craniotomy. No acute osseous abnormality identified.  Visualized scalp soft tissues are within normal limits.  Streak artifact from left ICA terminus region aneurysm clip.  Partially empty sella configuration.  Left anterior temporal and frontal lobe chronic encephalomalacia. Small area of cortical encephalomalacia in the posterior left operculum (series 2 image 15).  No ventriculomegaly. No midline shift, mass effect, or evidence of mass lesion.  Small chronic lacunar infarct in the left cerebellar hemisphere. No evidence of cortically based acute infarction identified.  No acute intracranial hemorrhage identified.  No abnormal enhancement identified.  Vascular Findings: Major intracranial venous structures are  enhancing.  Patent distal vertebral arteries.  Patent bilateral PICA vessels. Vertebrobasilar junction is within normal limits.  Mild basilar artery irregularity and no basilar stenosis.  Superior cerebellar artery origins are within normal limits.  Left greater than right fetal type PCA origins.  Bilateral PCA branches are within normal limits.  Right ICA siphon is patent without atherosclerosis.  Right ophthalmic and posterior communicating artery origins are within normal limits.  Nondominant right ACA A1 segment.  Right ICA terminus otherwise is normal.  Normal right MCA origin.  Right MCA branches are within normal limits.  The left ICA siphon is patent and asymmetrically larger than the right.  There is mild left ICA calcified atherosclerosis.  There is an inferiorly directed saccular aneurysm arising from the proximal supraclinoid segment measuring 2 x 2 x 3 mm (sagittal image 91, 1 mm axial image 105).  This is proximal to the aneurysm clip.  Its office at the left ophthalmic artery origin which is within normal limits. The streak artifact from the aneurysm clip partially obscures the left posterior communicating artery origin.  That artery remains patent.  No aneurysm enhancement at the level of the clip is identified.  The left ICA terminus otherwise is normal.  Left MCA and ACA origins are normal.  The left ACA A1 segment is dominant.  Normal anterior communicating artery.  ACA branches are within normal limits; small infundibulum at a left lenticulostriate branch origin is noted.  Left MCA branches are normal except for a decrease in left MCA anterior division flow compared to the right.   Review of the MIP images confirms the above findings.  IMPRESSION: 1.  Small 2 x 2 x 3 mm saccular left ICA aneurysm arises off the tip to the left ophthalmic artery origin and is directed inferiorly.  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 2.  No distal left ICA aneurysm recurrence  detected by CTA status post aneurysm clipping. 3.  No intracranial artery stenosis.  Mild ICA siphon and basilar artery atherosclerosis. 4.  Chronic small medium size vessel ischemia.  Study discussed by telephone with Dr. David Stall on 12/04/2011 at 0820 hours.  Original Report Authenticated By: Harley Hallmark, M.D.   Dg Chest 2 View  12/03/2011  *RADIOLOGY REPORT*  Clinical Data: Syncope; leg swelling.  CHEST - 2 VIEW  Comparison: None.  Findings: The lungs are well-aerated and clear.  There is no evidence of focal opacification, pleural effusion or pneumothorax.  The heart is borderline normal in size; calcification is noted within the aortic arch.  No acute osseous abnormalities are seen. Mild degenerative change is noted along the thoracic spine; nodular density overlying the mid thoracic spine on the lateral view is thought to reflect lateral osteophytes.  IMPRESSION: No acute cardiopulmonary process seen.  Original Report Authenticated By: Tonia Ghent, M.D.   Ct Angio Neck W/cm &/or Wo/cm  12/04/2011  *RADIOLOGY REPORT*  Clinical Data:  76 year old female with syncope, head trauma. History of intracranial aneurysm clipping.  CT ANGIOGRAPHY HEAD AND NECK  Technique:  Multidetector CT imaging of the head and neck was performed using the standard protocol during bolus administration of intravenous contrast.  Multiplanar CT image reconstructions including MIPs were obtained to evaluate the vascular anatomy. Carotid stenosis measurements (when applicable) are obtained utilizing NASCET criteria, using the distal internal carotid diameter as the denominator.  Contrast: 50mL OMNIPAQUE IOHEXOL 350 MG/ML IV SOLN  Comparison:  Head CT without contrast 10/27/1937.  CTA NECK  Findings:  Retained secretions in the trachea.  Minor dependent atelectasis in the lung apices.  No superior mediastinal lymphadenopathy.  Thyroid, oropharynx, nasopharynx, parapharyngeal spaces, retropharyngeal space, sublingual space,  submandibular glands and parotid glands are within normal limits.  Asymmetry of the left vallecula and piriform sinus is noted without discrete mass.  No cervical lymphadenopathy.  Cervical spine degenerative changes. Visualized paranasal sinuses and mastoids are clear.  Mild upper thoracic spondylolisthesis. No acute osseous abnormality identified.  Visualized orbit soft tissues are within normal limits.  Vascular Findings: Moderate to severe arch atherosclerosis.  Bovine arch configuration.  No common carotid artery origin stenosis.  No significant subclavian artery origin stenosis.  Tortuous right common carotid artery.  Mild right carotid bifurcation and proximal right ICA atherosclerosis.  No cervical right ICA stenosis.  Tortuosity is noted.  Right vertebral artery origin is normal.  Tortuosity of the right vertebral artery throughout the neck which remains patent without stenosis to the skull base.  Minimal atherosclerosis at the left carotid bifurcation.  Mild left ICA bulb atherosclerosis.  Tortuous left ICA with a kinked appearance (sagittal images 102, 97) but otherwise no cervical left ICA stenosis.  Left vertebral artery origin is within normal limits.  Tortuous left vertebral  artery throughout the neck is patent to the skull base without stenosis.   Review of the MIP images confirms the above findings.  IMPRESSION: 1.  Tortuosity of the cervical ICA resulting in a kinked appearance on the left.  No significant atherosclerotic carotid stenosis in the neck. 2.  Tortuous vertebral artery without significant stenosis. 3.  Intracranial findings are below. 4.  Retained secretions in the trachea.  Asymmetric appearance of the left vallecula and piriform sinus may also reflect retained secretions.  CTA HEAD  Findings:  Remote osseous changes of left frontotemporal craniotomy. No acute osseous abnormality identified.  Visualized scalp soft tissues are within normal limits.  Streak artifact from left ICA  terminus region aneurysm clip.  Partially empty sella configuration.  Left anterior temporal and frontal lobe chronic encephalomalacia. Small area of cortical encephalomalacia in the posterior left operculum (series 2 image 15).  No ventriculomegaly. No midline shift, mass effect, or evidence of mass lesion.  Small chronic lacunar infarct in the left cerebellar hemisphere. No evidence of cortically based acute infarction identified.  No acute intracranial hemorrhage identified.  No abnormal enhancement identified.  Vascular Findings: Major intracranial venous structures are enhancing.  Patent distal vertebral arteries.  Patent bilateral PICA vessels. Vertebrobasilar junction is within normal limits.  Mild basilar artery irregularity and no basilar stenosis.  Superior cerebellar artery origins are within normal limits.  Left greater than right fetal type PCA origins.  Bilateral PCA branches are within normal limits.  Right ICA siphon is patent without atherosclerosis.  Right ophthalmic and posterior communicating artery origins are within normal limits.  Nondominant right ACA A1 segment.  Right ICA terminus otherwise is normal.  Normal right MCA origin.  Right MCA branches are within normal limits.  The left ICA siphon is patent and asymmetrically larger than the right.  There is mild left ICA calcified atherosclerosis.  There is an inferiorly directed saccular aneurysm arising from the proximal supraclinoid segment measuring 2 x 2 x 3 mm (sagittal image 91, 1 mm axial image 105).  This is proximal to the aneurysm clip.  Its office at the left ophthalmic artery origin which is within normal limits. The streak artifact from the aneurysm clip partially obscures the left posterior communicating artery origin.  That artery remains patent.  No aneurysm enhancement at the level of the clip is identified.  The left ICA terminus otherwise is normal.  Left MCA and ACA origins are normal.  The left ACA A1 segment is dominant.   Normal anterior communicating artery.  ACA branches are within normal limits; small infundibulum at a left lenticulostriate branch origin is noted.  Left MCA branches are normal except for a decrease in left MCA anterior division flow compared to the right.   Review of the MIP images confirms the above findings.  IMPRESSION: 1.  Small 2 x 2 x 3 mm saccular left ICA aneurysm arises off the tip to the left ophthalmic artery origin and is directed inferiorly.  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 2.  No distal left ICA aneurysm recurrence detected by CTA status post aneurysm clipping. 3.  No intracranial artery stenosis.  Mild ICA siphon and basilar artery atherosclerosis. 4.  Chronic small medium size vessel ischemia.  Study discussed by telephone with Dr. David Stall on 12/04/2011 at 0820 hours.  Original Report Authenticated By: Harley Hallmark, M.D.    Medications: I have reviewed the patient's current medications.  Assessment and plan: Principal Problem: 1.Syncope/Polymorphic VT: -Replete  K. -Mag 2.0 -P consulted, recommended Cath and if no obvious obstruction possible ICD. Agree with cards.  2. HYPOTHYROIDISM continue treatment.  3. HYPERLIPIDEMIA Continue statins.  4. HYPERTENSION Controlled.  5. Bradycardia Continues with HR with  In 50 and 70 off propanolol.     LOS: 2 days   Marinda Elk M.D. Pager: [phone] [facility] 12/05/2011, 8:44 AM

## 2011-12-05 NOTE — Progress Notes (Signed)
  Echocardiogram 2D Echocardiogram has been performed.  Erika Rivera 12/05/2011, 11:14 AM

## 2011-12-05 NOTE — Progress Notes (Signed)
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  SUBJECTIVE: The patient had asymptomatic nonsustained torsades overnight.  She denies further syncope.  She denies CP or SOB.     . aspirin EC  81 mg Oral Daily  . enoxaparin  40 mg Subcutaneous Q24H  . hydrochlorothiazide  12.5 mg Oral Daily  . irbesartan  300 mg Oral Daily  . levothyroxine  175 mcg Oral Q breakfast  . potassium chloride  40 mEq Oral TID  . simvastatin  20 mg Oral QPM  . sodium chloride  3 mL Intravenous Q12H  . sodium chloride  3 mL Intravenous Q12H      OBJECTIVE: Physical Exam: Filed Vitals:   12/04/11 2201 12/05/11 0438 12/05/11 0700 12/05/11 0800  BP: 165/80 186/88 155/79 146/77  Pulse: 64 67 52 57  Temp: 97.1 F (36.2 C) 97.7 F (36.5 C)  98.4 F (36.9 C)  TempSrc: Oral Oral  Oral  Resp: 18 18 14 14  Height:      Weight:      SpO2: 93% 94% 96% 97%    Intake/Output Summary (Last 24 hours) at 12/05/11 0919 Last data filed at 12/04/11 1430  Gross per 24 hour  Intake    240 ml  Output    750 ml  Net   -510 ml    Telemetry reveals sinus rhythm with frequent PVCs and torasades x 1,  Frequently bradycardic  GEN- The patient is well appearing, alert and oriented x 3 today.   Head- normocephalic, atraumatic Eyes-  Sclera clear, conjunctiva pink Ears- hearing intact Oropharynx- clear Neck- supple, no JVP Lymph- no cervical lymphadenopathy Lungs- Clear to ausculation bilaterally, normal work of breathing Heart- Regular rate and rhythm, no murmurs, rubs or gallops, PMI not laterally displaced GI- soft, NT, ND, + BS Extremities- no clubbing, cyanosis, or edema  LABS: Basic Metabolic Panel:  Basename 12/04/11 0830 12/04/11 0624 12/03/11 2333 12/03/11 1538  NA -- 142 -- 138  K -- 3.4* -- 3.8  CL -- 109 -- 107  CO2 -- 25 -- 22  GLUCOSE -- 75 -- 70  BUN -- 19 -- 18  CREATININE -- 1.29* 1.14* --  CALCIUM -- 10.2 -- 10.5  MG 2.0 -- -- --  PHOS -- -- -- --   Liver  Function Tests: No results found for this basename: AST:2,ALT:2,ALKPHOS:2,BILITOT:2,PROT:2,ALBUMIN:2 in the last 72 hours No results found for this basename: LIPASE:2,AMYLASE:2 in the last 72 hours CBC:  Basename 12/04/11 0624 12/03/11 2333 12/03/11 1538  WBC 6.3 7.2 --  NEUTROABS -- -- 3.4  HGB 10.9* 11.9* --  HCT 33.2* 35.4* --  MCV 93.5 93.2 --  PLT 221 220 --   Cardiac Enzymes:  Basename 12/04/11 1455 12/04/11 0830 12/03/11 2332  CKTOTAL 127 85 106  CKMB 1.9 1.9 2.2  CKMBINDEX -- -- --  TROPONINI <0.30 <0.30 <0.30    Thyroid Function Tests:  Basename 12/04/11 0624  TSH 3.754  T4TOTAL --  T3FREE --  THYROIDAB --   Anemia Panel: No results found for this basename: VITAMINB12,FOLATE,FERRITIN,TIBC,IRON,RETICCTPCT in the last 72 hours  Echo is pending  ASSESSMENT AND PLAN:  Principal Problem:  *Polymorphic ventricular tachycardia Active Problems:  HYPOTHYROIDISM  HYPERLIPIDEMIA  HYPERTENSION  Syncope  Bradycardia  1.  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  Presently, she is too bradycardic for beta blocker therapy. I do not think that this is   likely to be ischemic and I feel that she will require an ICD to be implanted. Per Dr Taylor, I think that cath prior to ICD implantation is necessary.  I would favor medical management for CAD unless she is felt to have tight stenosis.  Once ICD is in place, will add beta blocker therapy and aggressively treat fascicular PVCs to prevent recurrent torsades.  She may ultimately require abaltion for this focus if she does not respond to medical therapy.  Keep K>4 and Mg>2 If further torsades, consider addition of a beta blocker if HR will allow  2.  Bradycardia- limits our ability to treat torsades presently Will plan to place a dual chamber ICD pending cath results on Monday  Iracema Lanagan, MD 12/05/2011 9:19 AM  

## 2011-12-06 LAB — BASIC METABOLIC PANEL
BUN: 20 mg/dL (ref 6–23)
CO2: 26 mEq/L (ref 19–32)
Chloride: 111 mEq/L (ref 96–112)
Creatinine, Ser: 1.39 mg/dL — ABNORMAL HIGH (ref 0.50–1.10)
Glucose, Bld: 102 mg/dL — ABNORMAL HIGH (ref 70–99)
Potassium: 4.3 mEq/L (ref 3.5–5.1)

## 2011-12-06 MED ORDER — SODIUM CHLORIDE 0.9 % IR SOLN
80.0000 mg | Status: DC
  Filled 2011-12-06: qty 2

## 2011-12-06 MED ORDER — SODIUM CHLORIDE 0.9 % IJ SOLN
3.0000 mL | Freq: Two times a day (BID) | INTRAMUSCULAR | Status: DC
  Administered 2011-12-06: 3 mL via INTRAVENOUS

## 2011-12-06 MED ORDER — SODIUM CHLORIDE 0.9 % IV SOLN: 250.0000 mL | INTRAVENOUS | Status: DC | PRN

## 2011-12-06 MED ORDER — CHLORHEXIDINE GLUCONATE 4 % EX LIQD
60.0000 mL | Freq: Once | CUTANEOUS | Status: AC
  Administered 2011-12-07: 4 via TOPICAL
  Filled 2011-12-06: qty 60

## 2011-12-06 MED ORDER — SODIUM CHLORIDE 0.9 % IV SOLN: INTRAVENOUS | Status: DC

## 2011-12-06 MED ORDER — CHLORHEXIDINE GLUCONATE 4 % EX LIQD: 60.0000 mL | Freq: Once | CUTANEOUS | Status: DC

## 2011-12-06 MED ORDER — SODIUM CHLORIDE 0.9 % IJ SOLN: 3.0000 mL | INTRAMUSCULAR | Status: DC | PRN

## 2011-12-06 MED ORDER — VANCOMYCIN HCL IN DEXTROSE 1-5 GM/200ML-% IV SOLN: 1000.0000 mg | INTRAVENOUS | Status: DC

## 2011-12-06 MED ORDER — CHLORHEXIDINE GLUCONATE 4 % EX LIQD
60.0000 mL | Freq: Once | CUTANEOUS | Status: AC
  Administered 2011-12-06: 4 via TOPICAL
  Filled 2011-12-06: qty 30

## 2011-12-06 MED ORDER — CHLORHEXIDINE GLUCONATE 4 % EX LIQD
CUTANEOUS | Status: AC
  Filled 2011-12-06: qty 30

## 2011-12-06 MED ORDER — VANCOMYCIN HCL IN DEXTROSE 1-5 GM/200ML-% IV SOLN
1000.0000 mg | INTRAVENOUS | Status: DC
  Filled 2011-12-06: qty 200

## 2011-12-06 NOTE — Progress Notes (Signed)
Subjective: No compalins.  Objective: Filed Vitals:   12/06/11 0730 12/06/11 0800 12/06/11 1000 12/06/11 1142  BP:  131/60 139/47   Pulse:  77 71   Temp: 98.4 F (36.9 C)   97.5 F (36.4 C)  TempSrc: Oral   Oral  Resp:  19 17   Height:      Weight:      SpO2:  97% 96%    Weight change:   Intake/Output Summary (Last 24 hours) at 12/06/11 1144 Last data filed at 12/06/11 0800  Gross per 24 hour  Intake    800 ml  Output   1220 ml  Net   -420 ml    General: Alert, awake, oriented x3, in no acute distress.  HEENT: No bruits, no goiter.  Heart: Regular rate and rhythm, without murmurs, rubs, gallops.  Lungs: Good air movement, cta B/L Abdomen: Soft, nontender, nondistended, positive bowel sounds.  Neuro: Grossly intact, nonfocal.   Lab Results:  Basename 12/06/11 0945 12/05/11 1153 12/04/11 0830 12/04/11 0624 12/03/11 2333 12/03/11 1538  NA 143 142 -- 142 -- 138  K 4.3 3.5 -- 3.4* -- 3.8  CL 111 108 -- 109 -- 107  CO2 26 25 -- 25 -- 22  GLUCOSE 102* 95 -- 75 -- 70  BUN 20 19 -- 19 -- 18  CREATININE 1.39* 1.33* -- 1.29* 1.14* 1.21*  CALCIUM 10.3 10.1 -- 10.2 -- 10.5  MG -- 2.2 2.0 -- -- --  PHOS -- -- -- -- -- --   No results found for this basename: AST:2,ALT:2,ALKPHOS:2,BILITOT:2,PROT:2,ALBUMIN:2 in the last 72 hours No results found for this basename: LIPASE:2,AMYLASE:2 in the last 72 hours  Basename 12/04/11 0624 12/03/11 2333 12/03/11 1538  WBC 6.3 7.2 --  NEUTROABS -- -- 3.4  HGB 10.9* 11.9* --  HCT 33.2* 35.4* --  MCV 93.5 93.2 --  PLT 221 220 --    Basename 12/04/11 1455 12/04/11 0830 12/03/11 2332  CKTOTAL 127 85 106  CKMB 1.9 1.9 2.2  CKMBINDEX -- -- --  TROPONINI <0.30 <0.30 <0.30   No components found with this basename: POCBNP:3 No results found for this basename: DDIMER:2 in the last 72 hours No results found for this basename: HGBA1C:2 in the last 72 hours No results found for this basename:  CHOL:2,HDL:2,LDLCALC:2,TRIG:2,CHOLHDL:2,LDLDIRECT:2 in the last 72 hours  Basename 12/04/11 0624  TSH 3.754  T4TOTAL --  T3FREE --  THYROIDAB --   No results found for this basename: VITAMINB12:2,FOLATE:2,FERRITIN:2,TIBC:2,IRON:2,RETICCTPCT:2 in the last 72 hours  Micro Results: Recent Results (from the past 240 hour(s))  MRSA PCR SCREENING     Status: Normal   Collection Time   12/05/11  6:45 AM      Component Value Range Status Comment   MRSA by PCR NEGATIVE  NEGATIVE  Final     Studies/Results: No results found.  Medications: I have reviewed the patient's current medications.  Assessment and plan: Principal Problem: 1.Syncope/Polymorphic VT: -keep  K > 4.0. -Mag >2.0 -EP following, recommended Cath and if no obvious obstruction possible ICD. Agree with cards.  2. HYPOTHYROIDISM continue treatment.  3. HYPERLIPIDEMIA Continue statins.  4. HYPERTENSION Controlled.  5. Bradycardia Continues with HR with  In 50 and 70 off propanolol.     LOS: 3 days   Marinda Elk M.D. Pager: [phone] [facility] 12/06/2011, 11:44 AM

## 2011-12-06 NOTE — Progress Notes (Signed)
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  SUBJECTIVE: No further arrhythmias.  She denies further syncope.  She denies CP or SOB.     . aspirin EC  81 mg Oral Daily  . enoxaparin  40 mg Subcutaneous Q24H  . hydrochlorothiazide  12.5 mg Oral Daily  . irbesartan  300 mg Oral Daily  . levothyroxine  175 mcg Oral Q breakfast  . potassium chloride  40 mEq Oral TID  . simvastatin  20 mg Oral QPM  . sodium chloride  3 mL Intravenous Q12H  . sodium chloride  3 mL Intravenous Q12H      OBJECTIVE: Physical Exam: Filed Vitals:   12/06/11 0600 12/06/11 0700 12/06/11 0730 12/06/11 0800  BP: 154/70 149/82  131/60  Pulse: 71 71  77  Temp:   98.4 F (36.9 C)   TempSrc:   Oral   Resp: 20 21  19  Height:      Weight:      SpO2: 96% 97%  97%    Intake/Output Summary (Last 24 hours) at 12/06/11 0859 Last data filed at 12/06/11 0800  Gross per 24 hour  Intake   1050 ml  Output   1520 ml  Net   -470 ml    Telemetry reveals sinus rhythm with frequent PVCs ,  Frequently bradycardic  GEN- The patient is well appearing, alert and oriented x 3 today.   Head- normocephalic, atraumatic Eyes-  Sclera clear, conjunctiva pink Ears- hearing intact Oropharynx- clear Neck- supple, no JVP Lymph- no cervical lymphadenopathy Lungs- Clear to ausculation bilaterally, normal work of breathing Heart- Regular rate and rhythm, no murmurs, rubs or gallops, PMI not laterally displaced GI- soft, NT, ND, + BS Extremities- no clubbing, cyanosis, or edema  LABS: Basic Metabolic Panel:  Basename 12/05/11 1153 12/04/11 0830 12/04/11 0624  NA 142 -- 142  K 3.5 -- 3.4*  CL 108 -- 109  CO2 25 -- 25  GLUCOSE 95 -- 75  BUN 19 -- 19  CREATININE 1.33* -- 1.29*  CALCIUM 10.1 -- 10.2  MG 2.2 2.0 --  PHOS -- -- --   Liver Function Tests: No results found for this basename: AST:2,ALT:2,ALKPHOS:2,BILITOT:2,PROT:2,ALBUMIN:2 in the last 72 hours No results found for this  basename: LIPASE:2,AMYLASE:2 in the last 72 hours CBC:  Basename 12/04/11 0624 12/03/11 2333 12/03/11 1538  WBC 6.3 7.2 --  NEUTROABS -- -- 3.4  HGB 10.9* 11.9* --  HCT 33.2* 35.4* --  MCV 93.5 93.2 --  PLT 221 220 --   Cardiac Enzymes:  Basename 12/04/11 1455 12/04/11 0830 12/03/11 2332  CKTOTAL 127 85 106  CKMB 1.9 1.9 2.2  CKMBINDEX -- -- --  TROPONINI <0.30 <0.30 <0.30    Thyroid Function Tests:  Basename 12/04/11 0624  TSH 3.754  T4TOTAL --  T3FREE --  THYROIDAB --   Anemia Panel: No results found for this basename: VITAMINB12,FOLATE,FERRITIN,TIBC,IRON,RETICCTPCT in the last 72 hours  Echo is pending  ASSESSMENT AND PLAN:  Principal Problem:  *Polymorphic ventricular tachycardia Active Problems:  HYPOTHYROIDISM  HYPERLIPIDEMIA  HYPERTENSION  Syncope  Bradycardia  1.  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  Presently, she is too bradycardic for beta blocker therapy. I do not think that this is likely to be ischemic and I feel that she will require an ICD to be implanted. Per Dr Taylor, I think that cath prior to ICD implantation   is necessary.  I would favor medical management for CAD unless she is felt to have tight stenosis.  Risks benefits, and alternatives to left heart catheterization and also defibrillator implantation were discussed at length with the patient who wishes to proceed.  Once ICD is in place, will add beta blocker therapy and aggressively treat fascicular PVCs to prevent recurrent torsades.  She may ultimately require abaltion for this focus if she does not respond to medical therapy.  Keep K>4 and Mg>2 If further torsades, consider addition of a beta blocker if HR will allow  2.  Bradycardia- limits our ability to treat torsades presently Will plan to place a dual chamber ICD pending cath results on Monday  Sterling Ucci, MD 12/06/2011 8:59 AM  

## 2011-12-07 ENCOUNTER — Encounter (HOSPITAL_COMMUNITY)
Admission: EM | Disposition: A | Discharge status: Home / Self Care | Payer: Self-pay | Source: Home / Self Care | Attending: Internal Medicine

## 2011-12-07 DIAGNOSIS — I2510 Atherosclerotic heart disease of native coronary artery without angina pectoris: Secondary | ICD-10-CM

## 2011-12-07 DIAGNOSIS — I472 Ventricular tachycardia, unspecified: Secondary | ICD-10-CM

## 2011-12-07 HISTORY — PX: CARDIAC DEFIBRILLATOR PLACEMENT: SHX171

## 2011-12-07 HISTORY — PX: IMPLANTABLE CARDIOVERTER DEFIBRILLATOR IMPLANT: SHX5473

## 2011-12-07 HISTORY — PX: LEFT HEART CATHETERIZATION WITH CORONARY ANGIOGRAM: SHX5451

## 2011-12-07 LAB — CBC
HCT: 36.9 % (ref 36.0–46.0)
MCH: 30.5 pg (ref 26.0–34.0)
MCHC: 32 g/dL (ref 30.0–36.0)
MCV: 95.3 fL (ref 78.0–100.0)
Platelets: 247 10*3/uL (ref 150–400)
RDW: 15.9 % — ABNORMAL HIGH (ref 11.5–15.5)
WBC: 5.8 10*3/uL (ref 4.0–10.5)

## 2011-12-07 LAB — BASIC METABOLIC PANEL
BUN: 25 mg/dL — ABNORMAL HIGH (ref 6–23)
CO2: 24 mEq/L (ref 19–32)
Calcium: 10.6 mg/dL — ABNORMAL HIGH (ref 8.4–10.5)
Chloride: 112 mEq/L (ref 96–112)
Creatinine, Ser: 1.37 mg/dL — ABNORMAL HIGH (ref 0.50–1.10)
Glucose, Bld: 82 mg/dL (ref 70–99)

## 2011-12-07 SURGERY — IMPLANTABLE CARDIOVERTER DEFIBRILLATOR IMPLANT
Anesthesia: LOCAL

## 2011-12-07 SURGERY — LEFT HEART CATHETERIZATION WITH CORONARY ANGIOGRAM
Anesthesia: LOCAL

## 2011-12-07 MED ORDER — ACETAMINOPHEN 325 MG PO TABS: 650.0000 mg | ORAL_TABLET | ORAL | Status: DC | PRN

## 2011-12-07 MED ORDER — VERAPAMIL HCL 2.5 MG/ML IV SOLN
INTRAVENOUS | Status: AC
  Filled 2011-12-07: qty 2

## 2011-12-07 MED ORDER — SODIUM CHLORIDE 0.9 % IJ SOLN: 3.0000 mL | Freq: Two times a day (BID) | INTRAMUSCULAR | Status: DC

## 2011-12-07 MED ORDER — FENTANYL CITRATE 0.05 MG/ML IJ SOLN
INTRAMUSCULAR | Status: AC
  Filled 2011-12-07: qty 2

## 2011-12-07 MED ORDER — MIDAZOLAM HCL 5 MG/5ML IJ SOLN
INTRAMUSCULAR | Status: AC
  Filled 2011-12-07: qty 5

## 2011-12-07 MED ORDER — PROPRANOLOL HCL ER 80 MG PO CP24
80.0000 mg | ORAL_CAPSULE | Freq: Every day | ORAL | Status: DC
  Filled 2011-12-07 (×2): qty 1

## 2011-12-07 MED ORDER — NITROGLYCERIN 0.2 MG/ML ON CALL CATH LAB
INTRAVENOUS | Status: AC
  Filled 2011-12-07: qty 1

## 2011-12-07 MED ORDER — VANCOMYCIN HCL IN DEXTROSE 1-5 GM/200ML-% IV SOLN
1000.0000 mg | Freq: Two times a day (BID) | INTRAVENOUS | Status: AC
  Administered 2011-12-07: 1000 mg via INTRAVENOUS
  Filled 2011-12-07: qty 200

## 2011-12-07 MED ORDER — VANCOMYCIN HCL IN DEXTROSE 1-5 GM/200ML-% IV SOLN
1000.0000 mg | INTRAVENOUS | Status: DC
  Administered 2011-12-07: 1000 mg via INTRAVENOUS
  Filled 2011-12-07: qty 200

## 2011-12-07 MED ORDER — ONDANSETRON HCL 4 MG/2ML IJ SOLN: 4.0000 mg | Freq: Four times a day (QID) | INTRAMUSCULAR | Status: DC | PRN

## 2011-12-07 MED ORDER — HEPARIN SODIUM (PORCINE) 1000 UNIT/ML IJ SOLN
INTRAMUSCULAR | Status: AC
  Filled 2011-12-07: qty 1

## 2011-12-07 MED ORDER — LIDOCAINE HCL (PF) 1 % IJ SOLN
INTRAMUSCULAR | Status: AC
  Filled 2011-12-07: qty 30

## 2011-12-07 MED ORDER — SODIUM CHLORIDE 0.9 % IV SOLN: 250.0000 mL | INTRAVENOUS | Status: DC

## 2011-12-07 MED ORDER — ADENOSINE 12 MG/4ML IV SOLN
16.0000 mL | INTRAVENOUS | Status: DC
  Filled 2011-12-07: qty 16

## 2011-12-07 MED ORDER — SODIUM CHLORIDE 0.9 % IJ SOLN: 3.0000 mL | INTRAMUSCULAR | Status: DC | PRN

## 2011-12-07 MED ORDER — HYDROCODONE-ACETAMINOPHEN 5-325 MG PO TABS: 1.0000 | ORAL_TABLET | ORAL | Status: DC | PRN

## 2011-12-07 MED ORDER — MIDAZOLAM HCL 2 MG/2ML IJ SOLN
INTRAMUSCULAR | Status: AC
  Filled 2011-12-07: qty 2

## 2011-12-07 MED ORDER — LIDOCAINE HCL (PF) 1 % IJ SOLN
INTRAMUSCULAR | Status: AC
  Filled 2011-12-07: qty 60

## 2011-12-07 MED ORDER — SODIUM CHLORIDE 0.9 % IR SOLN
80.0000 mg | Status: DC
  Administered 2011-12-07: 80 mg
  Filled 2011-12-07: qty 2

## 2011-12-07 MED ORDER — ACETAMINOPHEN 325 MG PO TABS
325.0000 mg | ORAL_TABLET | ORAL | Status: DC | PRN
  Administered 2011-12-07 – 2011-12-08 (×2): 650 mg via ORAL
  Filled 2011-12-07 (×2): qty 2

## 2011-12-07 MED ORDER — LABETALOL HCL 5 MG/ML IV SOLN
INTRAVENOUS | Status: AC
  Filled 2011-12-07: qty 4

## 2011-12-07 MED ORDER — SODIUM CHLORIDE 0.9 % IV SOLN
1.0000 mL/kg/h | INTRAVENOUS | Status: DC
  Administered 2011-12-07: 1 mL/kg/h via INTRAVENOUS

## 2011-12-07 MED ORDER — HEPARIN (PORCINE) IN NACL 2-0.9 UNIT/ML-% IJ SOLN
INTRAMUSCULAR | Status: AC
  Filled 2011-12-07: qty 1000

## 2011-12-07 MED ORDER — HEPARIN (PORCINE) IN NACL 2-0.9 UNIT/ML-% IJ SOLN
INTRAMUSCULAR | Status: AC
  Filled 2011-12-07: qty 2000

## 2011-12-07 NOTE — Interval H&P Note (Signed)
History and Physical Interval Note:  12/07/2011 12:34 PM  Erika Rivera  has presented today for surgery, with the diagnosis of ideopathic polymorphic ventricular tachycardia with syncope  The various methods of treatment have been discussed with the patient and family. After consideration of risks, benefits and other options for treatment, the patient has consented to  Procedure(s) (LRB): IMPLANTABLE CARDIOVERTER DEFIBRILLATOR IMPLANT (N/A) as a surgical intervention .  The patients' history has been reviewed, patient examined, no change in status, stable for surgery.  I have reviewed the patients' chart and labs.  Questions were answered to the patient's satisfaction.    Risks, benefits, alternatives to ICD implantation were discussed in detail with the patient today. The patient  understands that the risks include but are not limited to bleeding, infection, pneumothorax, perforation, tamponade, vascular damage, renal failure, MI, stroke, death, inappropriate shocks, and lead dislodgement and wishes to proceed.     Hillis Range

## 2011-12-07 NOTE — Op Note (Signed)
SURGEON:  Hillis Range, MD      PREPROCEDURE DIAGNOSES:   1. Ideopathic polymorphic ventricular tachycardia  2. Recurrent syncope due to Ideopathic polymorphic ventricular tachycardia  3. Sick sinus syndrome, symptomatic bradycardia     POSTPROCEDURE DIAGNOSES:   1. Ideopathic polymorphic ventricular tachycardia  2. Recurrent syncope due to Ideopathic polymorphic ventricular tachycardia  3. Sick sinus syndrome, symptomatic bradycardia     PROCEDURES:    1. ICD implantation.  3. Defibrillation threshold testing     INTRODUCTION: Erika Rivera is a 76 y.o. female with recurrent syncope.  She is found to have ideopathic polymorphic ventricular tachycardia as the cause for her syncope.  Bradycardia has limited medical therapy.  The patient therefore presents today for ICD implantation for secondary prevention of syncope/ death secondary to ideopathic polymorphic ventricular tachycardia.      DESCRIPTION OF PROCEDURE:  Informed written consent was obtained and the patient was brought to the electrophysiology lab in the fasting state. The patient was adequately sedated with intravenous Versed, and fentanyl as outlined in the nursing report.  The patient's left chest was prepped and draped in the usual sterile fashion by the EP lab staff.  The skin overlying the left deltopectoral region was infiltrated with lidocaine for local analgesia.  A 5-cm incision was made over the left deltopectoral region.  A left subcutaneous defibrillator pocket was fashioned using a combination of sharp and blunt dissection.  Electrocautery was used to assure hemostasis.   RA/RV Lead Placement: The left axillary vein was cannulated with fluoroscopic visualization.  No contrast was required for this endeavor.  Through the left axillary vein, a [employer] Tendril SDX, model [phone]  (serial # [id number]  ) right atrial lead and a [employer] W[place]velt, model 4782N-56  (serial number [id number]) right ventricular  defibrillator lead were advanced with fluoroscopic visualization into the right atrial appendage and right ventricular apex positions respectively.  Initial atrial lead P-waves measured 2.4 mV with an impedance of 465 ohms and a threshold of 0.5 volts at 0.5 milliseconds.  The right ventricular lead R-wave measured 9.5 mV with impedance of 871 ohms and a threshold of 1.0 volts at 0.5 milliseconds.   The leads were secured to the pectoralis  fascia using #2 silk suture over the suture sleeves.  The pocket then  irrigated with copious gentamicin solution.  The leads were then  connected to a [facility] [address] model [phone] (serial  Number [id number]) ICD.  The defibrillator was placed into the  pocket.  The pocket was then closed in 2 layers with 2.0 Vicryl suture  for the subcutaneous and subcuticular layers.  Steri-Strips and a  sterile dressing were then applied.   DFT Testing: Defibrillation Threshold testing was then performed. Ventricular fibrillation was induced with the DC fibber.  Adequate sensing of ventricular  fibrillation was observed with no dropout with a programmed sensitivity of 1.68mV.  The patient was successfully defibrillated to sinus rhythm with a single 15 joules shock delivered from the device with an impedance of 69 ohms in a duration of 5 seconds.  The patient remained in sinus rhythm thereafter.  There were no early apparent complications.     CONCLUSIONS:   1. Recurrent syncope due to Ideopathic polymorphic ventricular tachycardia  2. Successful ICD implantation.   3. DFT less than or equal to 15 joules.   4. No early apparent complications.

## 2011-12-07 NOTE — H&P (View-Only) (Signed)
76 yo AAF with a h/o recurrent syncope found to have PVC dependant torsades.  SUBJECTIVE: The patient had asymptomatic nonsustained torsades overnight.  She denies further syncope.  She denies CP or SOB.     Marland Kitchen aspirin EC  81 mg Oral Daily  . enoxaparin  40 mg Subcutaneous Q24H  . hydrochlorothiazide  12.5 mg Oral Daily  . irbesartan  300 mg Oral Daily  . levothyroxine  175 mcg Oral Q breakfast  . potassium chloride  40 mEq Oral TID  . simvastatin  20 mg Oral QPM  . sodium chloride  3 mL Intravenous Q12H  . sodium chloride  3 mL Intravenous Q12H      OBJECTIVE: Physical Exam: Filed Vitals:   12/04/11 2201 12/05/11 0438 12/05/11 0700 12/05/11 0800  BP: 165/80 186/88 155/79 146/77  Pulse: 64 67 52 57  Temp: 97.1 F (36.2 C) 97.7 F (36.5 C)  98.4 F (36.9 C)  TempSrc: Oral Oral  Oral  Resp: 18 18 14 14   Height:      Weight:      SpO2: 93% 94% 96% 97%    Intake/Output Summary (Last 24 hours) at 12/05/11 0919 Last data filed at 12/04/11 1430  Gross per 24 hour  Intake    240 ml  Output    750 ml  Net   -510 ml    Telemetry reveals sinus rhythm with frequent PVCs and torasades x 1,  Frequently bradycardic  GEN- The patient is well appearing, alert and oriented x 3 today.   Head- normocephalic, atraumatic Eyes-  Sclera clear, conjunctiva pink Ears- hearing intact Oropharynx- clear Neck- supple, no JVP Lymph- no cervical lymphadenopathy Lungs- Clear to ausculation bilaterally, normal work of breathing Heart- Regular rate and rhythm, no murmurs, rubs or gallops, PMI not laterally displaced GI- soft, NT, ND, + BS Extremities- no clubbing, cyanosis, or edema  LABS: Basic Metabolic Panel:  Basename 12/04/11 0830 12/04/11 0624 12/03/11 2333 12/03/11 1538  NA -- 142 -- 138  K -- 3.4* -- 3.8  CL -- 109 -- 107  CO2 -- 25 -- 22  GLUCOSE -- 75 -- 70  BUN -- 19 -- 18  CREATININE -- 1.29* 1.14* --  CALCIUM -- 10.2 -- 10.5  MG 2.0 -- -- --  PHOS -- -- -- --   Liver  Function Tests: No results found for this basename: AST:2,ALT:2,ALKPHOS:2,BILITOT:2,PROT:2,ALBUMIN:2 in the last 72 hours No results found for this basename: LIPASE:2,AMYLASE:2 in the last 72 hours CBC:  Basename 12/04/11 0624 12/03/11 2333 12/03/11 1538  WBC 6.3 7.2 --  NEUTROABS -- -- 3.4  HGB 10.9* 11.9* --  HCT 33.2* 35.4* --  MCV 93.5 93.2 --  PLT 221 220 --   Cardiac Enzymes:  Basename 12/04/11 1455 12/04/11 0830 12/03/11 2332  CKTOTAL 127 85 106  CKMB 1.9 1.9 2.2  CKMBINDEX -- -- --  TROPONINI <0.30 <0.30 <0.30    Thyroid Function Tests:  Basename 12/04/11 0624  TSH 3.754  T4TOTAL --  T3FREE --  THYROIDAB --   Anemia Panel: No results found for this basename: VITAMINB12,FOLATE,FERRITIN,TIBC,IRON,RETICCTPCT in the last 72 hours  Echo is pending  ASSESSMENT AND PLAN:  Principal Problem:  *Polymorphic ventricular tachycardia Active Problems:  HYPOTHYROIDISM  HYPERLIPIDEMIA  HYPERTENSION  Syncope  Bradycardia  1.  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  Presently, she is too bradycardic for beta blocker therapy. I do not think that this is  likely to be ischemic and I feel that she will require an ICD to be implanted. Per Dr Ladona Ridgel, I think that cath prior to ICD implantation is necessary.  I would favor medical management for CAD unless she is felt to have tight stenosis.  Once ICD is in place, will add beta blocker therapy and aggressively treat fascicular PVCs to prevent recurrent torsades.  She may ultimately require abaltion for this focus if she does not respond to medical therapy.  Keep K>4 and Mg>2 If further torsades, consider addition of a beta blocker if HR will allow  2.  Bradycardia- limits our ability to treat torsades presently Will plan to place a dual chamber ICD pending cath results on Monday  Hillis Range, MD 12/05/2011 9:19 AM

## 2011-12-07 NOTE — H&P (View-Only) (Signed)
76 yo AAF with a h/o recurrent syncope found to have PVC dependant torsades.  SUBJECTIVE: No further arrhythmias.  She denies further syncope.  She denies CP or SOB.     Marland Kitchen aspirin EC  81 mg Oral Daily  . enoxaparin  40 mg Subcutaneous Q24H  . hydrochlorothiazide  12.5 mg Oral Daily  . irbesartan  300 mg Oral Daily  . levothyroxine  175 mcg Oral Q breakfast  . potassium chloride  40 mEq Oral TID  . simvastatin  20 mg Oral QPM  . sodium chloride  3 mL Intravenous Q12H  . sodium chloride  3 mL Intravenous Q12H      OBJECTIVE: Physical Exam: Filed Vitals:   12/06/11 0600 12/06/11 0700 12/06/11 0730 12/06/11 0800  BP: 154/70 149/82  131/60  Pulse: 71 71  77  Temp:   98.4 F (36.9 C)   TempSrc:   Oral   Resp: 20 21  19   Height:      Weight:      SpO2: 96% 97%  97%    Intake/Output Summary (Last 24 hours) at 12/06/11 0859 Last data filed at 12/06/11 0800  Gross per 24 hour  Intake   1050 ml  Output   1520 ml  Net   -470 ml    Telemetry reveals sinus rhythm with frequent PVCs ,  Frequently bradycardic  GEN- The patient is well appearing, alert and oriented x 3 today.   Head- normocephalic, atraumatic Eyes-  Sclera clear, conjunctiva pink Ears- hearing intact Oropharynx- clear Neck- supple, no JVP Lymph- no cervical lymphadenopathy Lungs- Clear to ausculation bilaterally, normal work of breathing Heart- Regular rate and rhythm, no murmurs, rubs or gallops, PMI not laterally displaced GI- soft, NT, ND, + BS Extremities- no clubbing, cyanosis, or edema  LABS: Basic Metabolic Panel:  Basename 12/05/11 1153 12/04/11 0830 12/04/11 0624  NA 142 -- 142  K 3.5 -- 3.4*  CL 108 -- 109  CO2 25 -- 25  GLUCOSE 95 -- 75  BUN 19 -- 19  CREATININE 1.33* -- 1.29*  CALCIUM 10.1 -- 10.2  MG 2.2 2.0 --  PHOS -- -- --   Liver Function Tests: No results found for this basename: AST:2,ALT:2,ALKPHOS:2,BILITOT:2,PROT:2,ALBUMIN:2 in the last 72 hours No results found for this  basename: LIPASE:2,AMYLASE:2 in the last 72 hours CBC:  Basename 12/04/11 0624 12/03/11 2333 12/03/11 1538  WBC 6.3 7.2 --  NEUTROABS -- -- 3.4  HGB 10.9* 11.9* --  HCT 33.2* 35.4* --  MCV 93.5 93.2 --  PLT 221 220 --   Cardiac Enzymes:  Basename 12/04/11 1455 12/04/11 0830 12/03/11 2332  CKTOTAL 127 85 106  CKMB 1.9 1.9 2.2  CKMBINDEX -- -- --  TROPONINI <0.30 <0.30 <0.30    Thyroid Function Tests:  Basename 12/04/11 0624  TSH 3.754  T4TOTAL --  T3FREE --  THYROIDAB --   Anemia Panel: No results found for this basename: VITAMINB12,FOLATE,FERRITIN,TIBC,IRON,RETICCTPCT in the last 72 hours  Echo is pending  ASSESSMENT AND PLAN:  Principal Problem:  *Polymorphic ventricular tachycardia Active Problems:  HYPOTHYROIDISM  HYPERLIPIDEMIA  HYPERTENSION  Syncope  Bradycardia  1.  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  Presently, she is too bradycardic for beta blocker therapy. I do not think that this is likely to be ischemic and I feel that she will require an ICD to be implanted. Per Dr Ladona Ridgel, I think that cath prior to ICD implantation  is necessary.  I would favor medical management for CAD unless she is felt to have tight stenosis.  Risks benefits, and alternatives to left heart catheterization and also defibrillator implantation were discussed at length with the patient who wishes to proceed.  Once ICD is in place, will add beta blocker therapy and aggressively treat fascicular PVCs to prevent recurrent torsades.  She may ultimately require abaltion for this focus if she does not respond to medical therapy.  Keep K>4 and Mg>2 If further torsades, consider addition of a beta blocker if HR will allow  2.  Bradycardia- limits our ability to treat torsades presently Will plan to place a dual chamber ICD pending cath results on Monday  Hillis Range, MD 12/06/2011 8:59 AM

## 2011-12-07 NOTE — CV Procedure (Signed)
   Cardiac Catheterization Procedure Note  Name: Erika Rivera MRN: [id number] DOB: 09/15/1936  Procedure: Left Heart Cath, Selective Coronary Angiography, pressure-wire analysis of the right coronary artery  Indication: Polymorphic ventricular tachycardia   Procedural Details: The right wrist was prepped, draped, and anesthetized with 1% lidocaine. Using the modified Seldinger technique, a 5 French sheath was introduced into the right radial artery. 3 mg of verapamil was administered through the sheath, weight-based unfractionated heparin was administered intravenously. Standard Judkins catheters were used for selective coronary angiography.  Left ventriculography was deferred because the patient has renal insufficiency and she has had an echocardiogram demonstrating normal LV function. Left ventricular pressure was recorded with the JR 4 catheter. Catheter exchanges were performed over an exchange length guidewire. There were no immediate procedural complications.   Following the diagnostic procedure, I elected to do pressure wire analysis of the right coronary artery. There was a moderately severe lesion in the mid right coronary. I given intracoronary nitroglycerin without significant change in the lesion. The lesion appeared to be in the range of 75% stenosis. An ACT was checked and the patient's ACT was approximately 225 seconds. An additional 2000 units of unfractionated heparin was administered. A 5 [place] JR 4 guide catheter was inserted. Using standard technique, the pressure wire was advanced beyond the lesion into the distal right coronary artery. Intravenous adenosine was administered. The baseline FFR was 0.98. The FFR at peak hyperemic and was 0.86. The wire was removed and the final angiogram demonstrated a stable appearance of the right coronary artery. A TR band was used for radial hemostasis at the completion of the procedure.  The patient was transferred to the post catheterization  recovery area for further monitoring.  Procedural Findings: Hemodynamics: AO 182/89 LV 179/15  Coronary angiography: Coronary dominance: right  Left mainstem: The left main is widely patent. There is no obstructive disease.  Left anterior descending (LAD): The LAD is widely patent throughout. There are minor luminal irregularities in the proximal vessel without significant stenosis. The first diagonal is small. The second diagonal is a moderate to large vessel. The LAD wraps around the LV apex with no significant obstructive disease.  Left circumflex (LCx): The left circumflex is widely patent. It supplies 2 obtuse marginal branches with no significant stenosis. The first OM was a large vessel and the second OM is small vessel  Right coronary artery (RCA): The RCA is mildly calcified throughout. At the junction of the proximal and mid vessel there is 75% stenosis present. The distal vessel is widely patent and it divides into a PDA branch and a small posterolateral branch. There is also a moderate caliber acute marginal branch present.  Left ventriculography: Left ventricular systolic function is normal, LVEF is estimated at 55-65%, there is no significant mitral regurgitation   Final Conclusions:   1. Moderately severe right coronary artery stenosis with a negative pressure wire. 2. Wide patency of the left main, LAD, and left circumflex.  Recommendations: I would recommend medical therapy for the patient's coronary artery disease. Angiographically the lesion in the right coronary artery appears significant, but with a negative pressure wire the patient should be treated with medical therapy and revascularization is not indicated. She is scheduled to undergo ICD implantation later today for treatment of her polymorphic ventricular tachycardia.  Tonny Bollman 12/07/2011, 8:42 AM

## 2011-12-07 NOTE — Progress Notes (Signed)
Informed patient about cardiac CATH video. Daughter at bedside and requested to not show video to patient.According to her, pt will become anxious after watching video.

## 2011-12-07 NOTE — Progress Notes (Signed)
Subjective: Left shoulder pain.  Objective: Filed Vitals:   12/07/11 1000 12/07/11 1015 12/07/11 1030 12/07/11 1100  BP: 141/69 157/88 151/75 154/66  Pulse: 64 69 66 58  Temp:      TempSrc:      Resp: 16 17 18 13   Height:      Weight:      SpO2: 99% 99% 96% 96%   Weight change:   Intake/Output Summary (Last 24 hours) at 12/07/11 1721 Last data filed at 12/07/11 1000  Gross per 24 hour  Intake    220 ml  Output    420 ml  Net   -200 ml    General: Alert, awake, oriented x3, in no acute distress.  HEENT: No bruits, no goiter.  Heart: Regular rate and rhythm, without murmurs, rubs, gallops.  Lungs: Good air movement, cta B/L Abdomen: Soft, nontender, nondistended, positive bowel sounds.  Neuro: Grossly intact, nonfocal.   Lab Results:  Basename 12/07/11 0520 12/06/11 0945 12/05/11 1153  NA 143 143 142  K 3.8 4.3 3.5  CL 112 111 108  CO2 24 26 25   GLUCOSE 82 102* 95  BUN 25* 20 19  CREATININE 1.37* 1.39* 1.33*  CALCIUM 10.6* 10.3 10.1  MG -- -- 2.2  PHOS -- -- --   No results found for this basename: AST:2,ALT:2,ALKPHOS:2,BILITOT:2,PROT:2,ALBUMIN:2 in the last 72 hours No results found for this basename: LIPASE:2,AMYLASE:2 in the last 72 hours  Basename 12/07/11 0520  WBC 5.8  NEUTROABS --  HGB 11.8*  HCT 36.9  MCV 95.3  PLT 247   No results found for this basename: CKTOTAL:3,CKMB:3,CKMBINDEX:3,TROPONINI:3 in the last 72 hours No components found with this basename: POCBNP:3 No results found for this basename: DDIMER:2 in the last 72 hours No results found for this basename: HGBA1C:2 in the last 72 hours No results found for this basename: CHOL:2,HDL:2,LDLCALC:2,TRIG:2,CHOLHDL:2,LDLDIRECT:2 in the last 72 hours No results found for this basename: TSH,T4TOTAL,FREET3,T3FREE,THYROIDAB in the last 72 hours No results found for this basename: VITAMINB12:2,FOLATE:2,FERRITIN:2,TIBC:2,IRON:2,RETICCTPCT:2 in the last 72 hours  Micro Results: Recent Results (from  the past 240 hour(s))  MRSA PCR SCREENING     Status: Normal   Collection Time   12/05/11  6:45 AM      Component Value Range Status Comment   MRSA by PCR NEGATIVE  NEGATIVE  Final     Studies/Results: No results found.  Medications: I have reviewed the patient's current medications.  Assessment and plan: Principal Problem: 1.Syncope/Polymorphic VT: -S/p cath RCA showed stenotic lesion. Cards recommended medical management. -EP s/p ICD. 3.04.2013  2. HYPOTHYROIDISM continue treatment.  3. HYPERLIPIDEMIA Continue statins.  4. HYPERTENSION Controlled.  5. Bradycardia Continues with HR with  In 50 and 70 off propanolol.     LOS: 4 days   Marinda Elk M.D. Pager: [phone] [facility] 12/07/2011, 5:21 PM

## 2011-12-07 NOTE — Interval H&P Note (Signed)
History and Physical Interval Note:  12/07/2011 7:46 AM  Erika Rivera  has presented today for surgery, with the diagnosis of cp  The various methods of treatment have been discussed with the patient and family. After consideration of risks, benefits and other options for treatment, the patient has consented to  Procedure(s) (LRB): LEFT HEART CATHETERIZATION WITH CORONARY ANGIOGRAM (N/A) as a surgical intervention .  The patients' history has been reviewed, patient examined, no change in status, stable for surgery.  I have reviewed the patients' chart and labs.  Questions were answered to the patient's satisfaction.     Tonny Bollman 12/07/2011 7:46 AM

## 2011-12-08 ENCOUNTER — Other Ambulatory Visit: Payer: Self-pay

## 2011-12-08 ENCOUNTER — Inpatient Hospital Stay (HOSPITAL_COMMUNITY): Payer: Medicare Other

## 2011-12-08 LAB — BASIC METABOLIC PANEL
BUN: 25 mg/dL — ABNORMAL HIGH (ref 6–23)
Calcium: 10.4 mg/dL (ref 8.4–10.5)
GFR calc Af Amer: 43 mL/min — ABNORMAL LOW (ref >90)
GFR calc non Af Amer: 37 mL/min — ABNORMAL LOW (ref >90)
Glucose, Bld: 94 mg/dL (ref 70–99)
Sodium: 139 mEq/L (ref 135–145)

## 2011-12-08 LAB — LIPID PANEL
Cholesterol: 115 mg/dL (ref 0–200)
LDL Cholesterol: 44 mg/dL (ref 0–99)
Total CHOL/HDL Ratio: 2.1 RATIO
Triglycerides: 83 mg/dL (ref <150)
VLDL: 17 mg/dL (ref 0–40)

## 2011-12-08 MED ORDER — PROPRANOLOL HCL ER 120 MG PO CP24: 120.0000 mg | ORAL_CAPSULE | Freq: Every day | ORAL | Status: DC

## 2011-12-08 MED ORDER — PROPRANOLOL HCL ER 120 MG PO CP24
120.0000 mg | ORAL_CAPSULE | Freq: Every day | ORAL | Status: DC
  Administered 2011-12-08: 120 mg via ORAL
  Filled 2011-12-08: qty 1

## 2011-12-08 MED ORDER — YOU HAVE A PACEMAKER BOOK
Freq: Once | Status: DC
  Filled 2011-12-08: qty 1

## 2011-12-08 NOTE — Progress Notes (Signed)
12/08/2011 [facility], Bosie Clos SPARKS Case Management Note [phone]    CARE MANAGEMENT NOTE 12/08/2011  Patient:  Erika Rivera, Erika Rivera   Account Number:  [id number]  Date Initiated:  12/08/2011  Documentation initiated by:  Fransico Michael  Subjective/Objective Assessment:   admitted on 12/03/11 with syncope     Action/Plan:   prior to admission, patient lived at home with support from children. Requires some assistance with ADLs.   Anticipated DC Date:  12/08/2011   Anticipated DC Plan:  HOME W HOME HEALTH SERVICES      DC Planning Services  CM consult      [facility] Choice  HOME HEALTH   Choice offered to / List presented to:  [name]        HH arranged  HH-2 PT  HH-3 OT      [facility] agency  [facility]   Status of service:  Completed, signed off Medicare Important Message given?   (If response is "NO", the following Medicare IM given date fields will be blank) Date Medicare IM given:   Date Additional Medicare IM given:    Discharge Disposition:  HOME W HOME HEALTH SERVICES  Per UR Regulation:  Reviewed for med. necessity/level of care/duration of stay  Comments:  12/08/11-1141-J.Haliey Romberg,RN,BSN  [phone]      76 yo female admitted on 12/03/11 with syncope. Found to have PVC dependent Tors[place]. Prior to admission, patient lived at home with support from children. Required some assistance with ADLs. In to see patient regarding need for home health PT/OT. Choice of agencies given to patient and daughter with [facility] being chosen. Also, information on advanced directives given to and reviewed with patient and daughter. Debbie, RN with [facility] notified of referral.No further discharge/home needs identified.

## 2011-12-08 NOTE — Discharge Summary (Signed)
Admit date: 12/03/2011 Discharge date: 12/08/2011  Primary Care Physician:  Rene Paci, MD, MD   Discharge Diagnoses:   Active Hospital Problems  Diagnoses Date Noted   . Polymorphic ventricular tachycardia 12/05/2011   . HYPERLIPIDEMIA 08/26/2009   . HYPOTHYROIDISM 08/05/2009   . HYPERTENSION 08/05/2009     Resolved Hospital Problems  Diagnoses Date Noted Date Resolved  . Syncope 12/03/2011 12/08/2011  . Bradycardia 12/03/2011 12/08/2011     DISCHARGE MEDICATION: Medication List  As of 12/08/2011 10:16 AM   TAKE these medications         levothyroxine 175 MCG tablet   Commonly known as: SYNTHROID, LEVOTHROID   Take 1 tablet (175 mcg total) by mouth daily.      olmesartan-hydrochlorothiazide 40-12.5 MG per tablet   Commonly known as: BENICAR HCT   Take 1 tablet by mouth daily.      propranolol 120 MG 24 hr capsule   Commonly known as: INDERAL LA   Take 1 capsule (120 mg total) by mouth daily.      simvastatin 20 MG tablet   Commonly known as: ZOCOR   Take 20 mg by mouth every evening.      triamcinolone lotion 0.1 %   Commonly known as: KENALOG   Apply 1 application topically 2 (two) times daily as needed. For dry skin / itching              Consults: Treatment Team:  Shawnie Pons, MD   SIGNIFICANT DIAGNOSTIC STUDIES:  Ct Angio Head W/cm &/or Wo Cm  12/04/2011  *RADIOLOGY REPORT*  Clinical Data:  76 year old female with syncope, head trauma. History of intracranial aneurysm clipping.  CT ANGIOGRAPHY HEAD AND NECK  Technique:  Multidetector CT imaging of the head and neck was performed using the standard protocol during bolus administration of intravenous contrast.  Multiplanar CT image reconstructions including MIPs were obtained to evaluate the vascular anatomy. Carotid stenosis measurements (when applicable) are obtained utilizing NASCET criteria, using the distal internal carotid diameter as the denominator.  Contrast: 50mL OMNIPAQUE IOHEXOL 350 MG/ML IV  SOLN  Comparison:  Head CT without contrast 10/27/1937.  CTA NECK  Findings:  Retained secretions in the trachea.  Minor dependent atelectasis in the lung apices.  No superior mediastinal lymphadenopathy.  Thyroid, oropharynx, nasopharynx, parapharyngeal spaces, retropharyngeal space, sublingual space, submandibular glands and parotid glands are within normal limits.  Asymmetry of the left vallecula and piriform sinus is noted without discrete mass.  No cervical lymphadenopathy.  Cervical spine degenerative changes. Visualized paranasal sinuses and mastoids are clear.  Mild upper thoracic spondylolisthesis. No acute osseous abnormality identified.  Visualized orbit soft tissues are within normal limits.  Vascular Findings: Moderate to severe arch atherosclerosis.  Bovine arch configuration.  No common carotid artery origin stenosis.  No significant subclavian artery origin stenosis.  Tortuous right common carotid artery.  Mild right carotid bifurcation and proximal right ICA atherosclerosis.  No cervical right ICA stenosis.  Tortuosity is noted.  Right vertebral artery origin is normal.  Tortuosity of the right vertebral artery throughout the neck which remains patent without stenosis to the skull base.  Minimal atherosclerosis at the left carotid bifurcation.  Mild left ICA bulb atherosclerosis.  Tortuous left ICA with a kinked appearance (sagittal images 102, 97) but otherwise no cervical left ICA stenosis.  Left vertebral artery origin is within normal limits.  Tortuous left vertebral artery throughout the neck is patent to the skull base without stenosis.   Review of the MIP images  confirms the above findings.  IMPRESSION: 1.  Tortuosity of the cervical ICA resulting in a kinked appearance on the left.  No significant atherosclerotic carotid stenosis in the neck. 2.  Tortuous vertebral artery without significant stenosis. 3.  Intracranial findings are below. 4.  Retained secretions in the trachea.  Asymmetric  appearance of the left vallecula and piriform sinus may also reflect retained secretions.  CTA HEAD  Findings:  Remote osseous changes of left frontotemporal craniotomy. No acute osseous abnormality identified.  Visualized scalp soft tissues are within normal limits.  Streak artifact from left ICA terminus region aneurysm clip.  Partially empty sella configuration.  Left anterior temporal and frontal lobe chronic encephalomalacia. Small area of cortical encephalomalacia in the posterior left operculum (series 2 image 15).  No ventriculomegaly. No midline shift, mass effect, or evidence of mass lesion.  Small chronic lacunar infarct in the left cerebellar hemisphere. No evidence of cortically based acute infarction identified.  No acute intracranial hemorrhage identified.  No abnormal enhancement identified.  Vascular Findings: Major intracranial venous structures are enhancing.  Patent distal vertebral arteries.  Patent bilateral PICA vessels. Vertebrobasilar junction is within normal limits.  Mild basilar artery irregularity and no basilar stenosis.  Superior cerebellar artery origins are within normal limits.  Left greater than right fetal type PCA origins.  Bilateral PCA branches are within normal limits.  Right ICA siphon is patent without atherosclerosis.  Right ophthalmic and posterior communicating artery origins are within normal limits.  Nondominant right ACA A1 segment.  Right ICA terminus otherwise is normal.  Normal right MCA origin.  Right MCA branches are within normal limits.  The left ICA siphon is patent and asymmetrically larger than the right.  There is mild left ICA calcified atherosclerosis.  There is an inferiorly directed saccular aneurysm arising from the proximal supraclinoid segment measuring 2 x 2 x 3 mm (sagittal image 91, 1 mm axial image 105).  This is proximal to the aneurysm clip.  Its office at the left ophthalmic artery origin which is within normal limits. The streak artifact from  the aneurysm clip partially obscures the left posterior communicating artery origin.  That artery remains patent.  No aneurysm enhancement at the level of the clip is identified.  The left ICA terminus otherwise is normal.  Left MCA and ACA origins are normal.  The left ACA A1 segment is dominant.  Normal anterior communicating artery.  ACA branches are within normal limits; small infundibulum at a left lenticulostriate branch origin is noted.  Left MCA branches are normal except for a decrease in left MCA anterior division flow compared to the right.   Review of the MIP images confirms the above findings.  IMPRESSION: 1.  Small 2 x 2 x 3 mm saccular left ICA aneurysm arises off the tip to the left ophthalmic artery origin and is directed inferiorly.  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 2.  No distal left ICA aneurysm recurrence detected by CTA status post aneurysm clipping. 3.  No intracranial artery stenosis.  Mild ICA siphon and basilar artery atherosclerosis. 4.  Chronic small medium size vessel ischemia.  Study discussed by telephone with Dr. David Stall on 12/04/2011 at 0820 hours.  Original Report Authenticated By: Harley Hallmark, M.D.   Dg Chest 2 View  12/08/2011  *RADIOLOGY REPORT*  Clinical Data: ICD placement.  CHEST - 2 VIEW  Comparison: 12/03/2011  Findings: Left AICD has been placed.  No pneumothorax.  Right base atelectasis.  Heart is normal size.  Tortuosity of the thoracic aorta.  No effusions.  IMPRESSION: Left AICD.  No pneumothorax.  Right base atelectasis.  Original Report Authenticated By: Cyndie Chime, M.D.   Dg Chest 2 View  12/03/2011  *RADIOLOGY REPORT*  Clinical Data: Syncope; leg swelling.  CHEST - 2 VIEW  Comparison: None.  Findings: The lungs are well-aerated and clear.  There is no evidence of focal opacification, pleural effusion or pneumothorax.  The heart is borderline normal in size; calcification is noted within the aortic arch.  No acute  osseous abnormalities are seen. Mild degenerative change is noted along the thoracic spine; nodular density overlying the mid thoracic spine on the lateral view is thought to reflect lateral osteophytes.  IMPRESSION: No acute cardiopulmonary process seen.  Original Report Authenticated By: Tonia Ghent, M.D.   Ct Angio Neck W/cm &/or Wo/cm  12/04/2011  *RADIOLOGY REPORT*  Clinical Data:  76 year old female with syncope, head trauma. History of intracranial aneurysm clipping.  CT ANGIOGRAPHY HEAD AND NECK  Technique:  Multidetector CT imaging of the head and neck was performed using the standard protocol during bolus administration of intravenous contrast.  Multiplanar CT image reconstructions including MIPs were obtained to evaluate the vascular anatomy. Carotid stenosis measurements (when applicable) are obtained utilizing NASCET criteria, using the distal internal carotid diameter as the denominator.  Contrast: 50mL OMNIPAQUE IOHEXOL 350 MG/ML IV SOLN  Comparison:  Head CT without contrast 10/27/1937.  CTA NECK  Findings:  Retained secretions in the trachea.  Minor dependent atelectasis in the lung apices.  No superior mediastinal lymphadenopathy.  Thyroid, oropharynx, nasopharynx, parapharyngeal spaces, retropharyngeal space, sublingual space, submandibular glands and parotid glands are within normal limits.  Asymmetry of the left vallecula and piriform sinus is noted without discrete mass.  No cervical lymphadenopathy.  Cervical spine degenerative changes. Visualized paranasal sinuses and mastoids are clear.  Mild upper thoracic spondylolisthesis. No acute osseous abnormality identified.  Visualized orbit soft tissues are within normal limits.  Vascular Findings: Moderate to severe arch atherosclerosis.  Bovine arch configuration.  No common carotid artery origin stenosis.  No significant subclavian artery origin stenosis.  Tortuous right common carotid artery.  Mild right carotid bifurcation and proximal  right ICA atherosclerosis.  No cervical right ICA stenosis.  Tortuosity is noted.  Right vertebral artery origin is normal.  Tortuosity of the right vertebral artery throughout the neck which remains patent without stenosis to the skull base.  Minimal atherosclerosis at the left carotid bifurcation.  Mild left ICA bulb atherosclerosis.  Tortuous left ICA with a kinked appearance (sagittal images 102, 97) but otherwise no cervical left ICA stenosis.  Left vertebral artery origin is within normal limits.  Tortuous left vertebral artery throughout the neck is patent to the skull base without stenosis.   Review of the MIP images confirms the above findings.  IMPRESSION: 1.  Tortuosity of the cervical ICA resulting in a kinked appearance on the left.  No significant atherosclerotic carotid stenosis in the neck. 2.  Tortuous vertebral artery without significant stenosis. 3.  Intracranial findings are below. 4.  Retained secretions in the trachea.  Asymmetric appearance of the left vallecula and piriform sinus may also reflect retained secretions.  CTA HEAD  Findings:  Remote osseous changes of left frontotemporal craniotomy. No acute osseous abnormality identified.  Visualized scalp soft tissues are within normal limits.  Streak artifact from left ICA terminus region aneurysm clip.  Partially empty sella configuration.  Left anterior temporal and frontal lobe  chronic encephalomalacia. Small area of cortical encephalomalacia in the posterior left operculum (series 2 image 15).  No ventriculomegaly. No midline shift, mass effect, or evidence of mass lesion.  Small chronic lacunar infarct in the left cerebellar hemisphere. No evidence of cortically based acute infarction identified.  No acute intracranial hemorrhage identified.  No abnormal enhancement identified.  Vascular Findings: Major intracranial venous structures are enhancing.  Patent distal vertebral arteries.  Patent bilateral PICA vessels. Vertebrobasilar junction  is within normal limits.  Mild basilar artery irregularity and no basilar stenosis.  Superior cerebellar artery origins are within normal limits.  Left greater than right fetal type PCA origins.  Bilateral PCA branches are within normal limits.  Right ICA siphon is patent without atherosclerosis.  Right ophthalmic and posterior communicating artery origins are within normal limits.  Nondominant right ACA A1 segment.  Right ICA terminus otherwise is normal.  Normal right MCA origin.  Right MCA branches are within normal limits.  The left ICA siphon is patent and asymmetrically larger than the right.  There is mild left ICA calcified atherosclerosis.  There is an inferiorly directed saccular aneurysm arising from the proximal supraclinoid segment measuring 2 x 2 x 3 mm (sagittal image 91, 1 mm axial image 105).  This is proximal to the aneurysm clip.  Its office at the left ophthalmic artery origin which is within normal limits. The streak artifact from the aneurysm clip partially obscures the left posterior communicating artery origin.  That artery remains patent.  No aneurysm enhancement at the level of the clip is identified.  The left ICA terminus otherwise is normal.  Left MCA and ACA origins are normal.  The left ACA A1 segment is dominant.  Normal anterior communicating artery.  ACA branches are within normal limits; small infundibulum at a left lenticulostriate branch origin is noted.  Left MCA branches are normal except for a decrease in left MCA anterior division flow compared to the right.   Review of the MIP images confirms the above findings.  IMPRESSION: 1.  Small 2 x 2 x 3 mm saccular left ICA aneurysm arises off the tip to the left ophthalmic artery origin and is directed inferiorly.  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 2.  No distal left ICA aneurysm recurrence detected by CTA status post aneurysm clipping. 3.  No intracranial artery stenosis.  Mild ICA siphon and  basilar artery atherosclerosis. 4.  Chronic small medium size vessel ischemia.  Study discussed by telephone with Dr. David Stall on 12/04/2011 at 0820 hours.  Original Report Authenticated By: Harley Hallmark, M.D.     ECHO: Study Conclusions - Left ventricle: The cavity size was normal. Wall thickness was increased in a pattern of mild LVH. Systolic function was normal. The estimated ejection fraction was in therange of 50% to 55%. Wall motion was normal; there were no regional wall motion abnormalities. Doppler parameters are consistent with abnormal left ventricular relaxation (grade 1 diastolic dysfunction). - Aortic valve: Trivial regurgitation.      CARDIAC CATH & OTHER PROCEDURES: Final Conclusions:  1. Moderately severe right coronary artery stenosis with a negative pressure wire.  2. Wide patency of the left main, LAD, and left circumflex.  Recommendations: I would recommend medical therapy for the patient's coronary artery disease. Angiographically the lesion in the right coronary artery appears significant, but with a negative pressure wire the patient should be treated with medical therapy and revascularization is not indicated. She is scheduled to undergo ICD  implantation later today for treatment of her polymorphic ventricular tachycardia.   Recent Results (from the past 240 hour(s))  MRSA PCR SCREENING     Status: Normal   Collection Time   12/05/11  6:45 AM      Component Value Range Status Comment   MRSA by PCR NEGATIVE  NEGATIVE  Final     BRIEF ADMITTING H & P:  76 year old [place] female with a past medical history of hypertension, dyslipidemia, hypothyroidism who was brought to the ED for further evaluation after a syncopal episode. Apparently this patient has had 4 syncopal episodes in the past 2 and half months, her last syncopal episode was this morning and the previous one before that was last Sunday. Apparently early this morning, patient had a syncopal  episode and last thing she remembered was getting lightheaded and she found herself on the floor, she was easily able to get up and ask for help from her daughter. Her daughter noticed that her scalp most likely bleeding and brought her to the urgent care The urgent care then referred her to the ED for further evaluation. Her primary care practitioner is working up this patient for recommend syncopal episode, patient has had a nuclear stress test done earlier this month which was negative. She was supposed to have a echocardiogram done today however she ended up in the hospital. Patient claims that she feels somewhat lightheaded prior to these episodes, she is a poor historian and is not able to tell me further. Her daughter has witnessed one of these syncopal episodes, she describes no seizure-like activity no urinary or bowel incontinence. There has been no tongue biting. She is now being admitted to the hospitalist service for further evaluation and treatment.  Active Hospital Problems  Diagnoses Date Noted   . Polymorphic ventricular tachycardia: She came in with episode of syncope. She was significantly bradycardic. She was on propranolol which was held. She developed an episode of polymorphic VT's/stress the point for about 3 seconds. Her potassium was kept above 4 and magnesium above 2. Cardiology was consulted the day recommended cardiac catheterization which showed RCA stenosis, but recommended medical management please see above. EP Dr. Delight Ovens was consulted who placed an ICD. She was discharged in stable condition.  12/05/2011   . HYPERLIPIDEMIA: Stable no changes were made.  08/26/2009   . HYPOTHYROIDISM: No changes were made.  08/05/2009   . HYPERTENSION: She was restarted on her propranolol after it was felt that due to her bradycardia. She will followup with her primary care Dr. will titrate her blood pressure medications as tolerated.  08/05/2009     Resolved Hospital Problems  Diagnoses  Date Noted Date Resolved  . Syncope: This probably secondary to polymorphic VT's.  12/03/2011 12/08/2011  . Bradycardia: This resolved.  12/03/2011 12/08/2011     Disposition and Follow-up:  Discharge Orders    Future Appointments: Provider: Department: Dept Phone: Center:   12/16/2011 11:30 AM [facility]  None     Future Orders Please Complete By Expires   Diet - low sodium heart healthy      Increase activity slowly        Follow-up Information    Follow up with Rene Paci, MD in 2 weeks.      Follow up with Hillis Range, MD.   Contact information:   [address] [place], [address] [place] [address] [phone]  DISCHARGE EXAM:  General: Alert, awake, oriented x3, in no acute distress.  HEENT: No bruits, no goiter.  Heart: Regular rate and rhythm, without murmurs, rubs, gallops.  Lungs: Good air movement, cta B/L  Abdomen: Soft, nontender, nondistended, positive bowel sounds.  Neuro: Grossly intact, nonfocal.  Blood pressure 162/84, pulse 74, temperature 97.5 F (36.4 C), temperature source Oral, resp. rate 16, height 5\' 5"  (1.651 m), weight 76.1 kg (167 lb 12.3 oz), SpO2 98.00%.   Basename 12/08/11 0641 12/07/11 0520 12/05/11 1153  NA 139 143 --  K 3.8 3.8 --  CL 109 112 --  CO2 21 24 --  GLUCOSE 94 82 --  BUN 25* 25* --  CREATININE 1.37* 1.37* --  CALCIUM 10.4 10.6* --  MG -- -- 2.2  PHOS -- -- --   No results found for this basename: AST:2,ALT:2,ALKPHOS:2,BILITOT:2,PROT:2,ALBUMIN:2 in the last 72 hours No results found for this basename: LIPASE:2,AMYLASE:2 in the last 72 hours  Basename 12/07/11 0520  WBC 5.8  NEUTROABS --  HGB 11.8*  HCT 36.9  MCV 95.3  PLT 247    Signed: Marinda Elk M.D. 12/08/2011, 10:16 AM

## 2011-12-08 NOTE — Progress Notes (Signed)
12/08/2011 Fransico Michael SPARKS Case Management Note [phone]  HOME HEALTH AGENCIES SERVING [facility]   Agencies that are Medicare-Certified and are affiliated with [facility]  Telephone Number Address  [facility]   The [facility] has ownership interest in this company; however, you are under no obligation to use this agency. [phone] or  [phone] [address] [place], [place] [address]   Agencies that are Medicare-Certified and are not affiliated with [facility] Agency Telephone Number Address  [facility] [phone] Fax [phone] [address] [place], [place]  [address]  [facility] [phone] or [phone] Fax [phone] [address] [place], [place] [address]  [facility] [phone] Fax [phone] [address] [place], [place] [address]  [facility] [phone] Fax [phone] [address] [place], [place]  [address]  [facility] [phone] Fax [phone] [address] [place], [place] [address]  [facility] [phone] [address] [place], [place] [address]  [facility] [phone]  [address] [place], [place] [address]  [facility] [phone] or [phone] Fax [phone] [address] [place], [place]  [address]  [facility] [phone] Fax [phone] [address] [place], [place]  [address]  [facility]  [phone] Fax [phone] [address] [place], [place] [address]               Agencies that are not Medicare-Certified and are not affiliated with [facility] Agency Telephone Number Address  [facility] [place] [phone] or [phone] Fax [phone] [address] [place]  [address]  [facility] [phone] Fax [phone] [address] [place], [place]  [address]  [facility]  [phone] Fax [phone] [address] [place], [place] [address]  [facility] [phone] Fax [phone] [address] [place], [place] [address]  [facility] [phone] or [phone] Fax [phone] [address] [place], [place]  [address]  [facility] [phone] or [phone] Fax [phone] [address] [place], [place]  [address]  [facility] [phone] Fax [phone] [address] [place], [place]  [address]  [facility] [phone] [address] [place], [place]  [address]  [facility] [phone] Fax [phone] [address] [place], [place]  [address]  [facility] [phone] Fax [phone] [address] [place], [place]  [address]  [facility] [phone] Fax [phone] [address] [place], [place] [address]  [facility] [phone] Fax [phone] [address] [place], [place]  [address]   In to offer choice of home health agencies to patient  and daughter. [facility]. Debbie,RN with [facility] notified of referral.

## 2011-12-08 NOTE — Progress Notes (Addendum)
76 yo AAF with a h/o recurrent syncope found to have PVC dependant torsades, now with ICD in place.  SUBJECTIVE: The patient is doing well s/p cath/ ICD.  She denies further syncope.  She denies CP or SOB.     Marland Kitchen aspirin EC  81 mg Oral Daily  . chlorhexidine      . enoxaparin  40 mg Subcutaneous Q24H  . fentaNYL      . fentaNYL      . heparin      . heparin      . heparin      . hydrochlorothiazide  12.5 mg Oral Daily  . irbesartan  300 mg Oral Daily  . labetalol      . levothyroxine  175 mcg Oral Q breakfast  . lidocaine      . lidocaine      . midazolam      . midazolam      . midazolam      . nitroGLYCERIN      . propranolol  120 mg Oral Daily  . simvastatin  20 mg Oral QPM  . vancomycin  1,000 mg Intravenous Q12H  . verapamil      . DISCONTD: adenosine  16 mL Intravenous To Cath  . DISCONTD: gentamicin irrigation  80 mg Irrigation On Call  . DISCONTD: gentamicin irrigation  80 mg Irrigation To Cath  . DISCONTD: propranolol  80 mg Oral Daily  . DISCONTD: sodium chloride  3 mL Intravenous Q12H  . DISCONTD: sodium chloride  3 mL Intravenous Q12H  . DISCONTD: sodium chloride  3 mL Intravenous Q12H  . DISCONTD: sodium chloride  3 mL Intravenous Q12H  . DISCONTD: vancomycin  1,000 mg Intravenous On Call  . DISCONTD: vancomycin  1,000 mg Intravenous To Cath      . DISCONTD: sodium chloride    . DISCONTD: sodium chloride 1 mL/kg/hr (12/07/11 1032)  . DISCONTD: sodium chloride      OBJECTIVE: Physical Exam: Filed Vitals:   12/07/11 1030 12/07/11 1100 12/07/11 2100 12/08/11 0433  BP: 151/75 154/66 154/81 162/84  Pulse: 66 58 75 74  Temp:   98.3 F (36.8 C) 97.5 F (36.4 C)  TempSrc:   Oral Oral  Resp: 18 13 18 16   Height:      Weight:      SpO2: 96% 96% 97% 98%    Intake/Output Summary (Last 24 hours) at 12/08/11 1610 Last data filed at 12/08/11 0300  Gross per 24 hour  Intake    360 ml  Output    200 ml  Net    160 ml    Telemetry reveals sinus rhythm,  no sustained arrhythmias   GEN- The patient is well appearing, alert and oriented x 3 today.   Head- normocephalic, atraumatic Eyes-  Sclera clear, conjunctiva pink Ears- hearing intact Oropharynx- clear Neck- supple, no JVP Lymph- no cervical lymphadenopathy Lungs- Clear to ausculation bilaterally, normal work of breathing Heart- Regular rate and rhythm, no murmurs, rubs or gallops, PMI not laterally displaced GI- soft, NT, ND, + BS Extremities- no clubbing, cyanosis, or edema ICD pocket without hematoma  LABS: Basic Metabolic Panel:  Basename 12/07/11 0520 12/06/11 0945 12/05/11 1153  NA 143 143 --  K 3.8 4.3 --  CL 112 111 --  CO2 24 26 --  GLUCOSE 82 102* --  BUN 25* 20 --  CREATININE 1.37* 1.39* --  CALCIUM 10.6* 10.3 --  MG -- -- 2.2  PHOS -- -- --  Liver Function Tests: No results found for this basename: AST:2,ALT:2,ALKPHOS:2,BILITOT:2,PROT:2,ALBUMIN:2 in the last 72 hours No results found for this basename: LIPASE:2,AMYLASE:2 in the last 72 hours CBC:  Basename 12/07/11 0520  WBC 5.8  NEUTROABS --  HGB 11.8*  HCT 36.9  MCV 95.3  PLT 247   ASSESSMENT AND PLAN:  Principal Problem:  *Polymorphic ventricular tachycardia Active Problems:  HYPOTHYROIDISM  HYPERLIPIDEMIA  HYPERTENSION  Syncope  Bradycardia  CXR- no ptx, stable leads  1.  PVC dependant torsades/ ideopathic polymorphic VT- S/p ICD ICD interrogation reviewed this am and normal Will A pace at 70 bpm Increase propranolol to 120mg  daily  Keep K>4 and Mg>2  2.  Bradycardia-  Resolved s/p ICD with atrial pacing  3. CAD- RCA lesion discussed with Dr Excell Seltzer.  Flow wire demonstrates that this lesion is not hemodynamically significant at ths time.  We will therefore continue aggressive medical therapy. ASA, propranolol, and statin. Fasting lipids are pending this am.  4.  HTN- above goal Increase propranolol to 120mg  daily  Could DC to home from my standpoint this afternoon if ambulatory  and without concerns  She will need a wound check in my device clinic in 10 days as well as follow-up with me in 3 months She will need to see my PA Tereso Newcomer) for CAD follow up in 4 weeks.   Hillis Range, MD 12/08/2011 7:28 AM

## 2011-12-15 ENCOUNTER — Ambulatory Visit: Payer: Medicare Other | Admitting: Internal Medicine

## 2011-12-16 ENCOUNTER — Other Ambulatory Visit (HOSPITAL_COMMUNITY): Payer: Medicare Other

## 2011-12-21 ENCOUNTER — Encounter: Payer: Self-pay | Admitting: Internal Medicine

## 2011-12-21 ENCOUNTER — Ambulatory Visit (INDEPENDENT_AMBULATORY_CARE_PROVIDER_SITE_OTHER): Payer: Medicare Other | Admitting: *Deleted

## 2011-12-21 DIAGNOSIS — I472 Ventricular tachycardia: Secondary | ICD-10-CM

## 2011-12-21 NOTE — Progress Notes (Signed)
Wound check defib  

## 2011-12-22 LAB — ICD DEVICE OBSERVATION
AL IMPEDENCE ICD: 350 Ohm
AL THRESHOLD: 0.5 V
ATRIAL PACING ICD: 84 pct
BAMS-0001: 170 {beats}/min
BAMS-0003: 70 {beats}/min
FVT: 0
MODE SWITCH EPISODES: 0
PACEART VT: 0
RV LEAD AMPLITUDE: 11.8 mv
RV LEAD THRESHOLD: 0.75 V
VENTRICULAR PACING ICD: 14 pct

## 2011-12-23 ENCOUNTER — Encounter: Payer: Self-pay | Admitting: Internal Medicine

## 2011-12-23 ENCOUNTER — Ambulatory Visit (INDEPENDENT_AMBULATORY_CARE_PROVIDER_SITE_OTHER): Payer: Medicare Other | Admitting: Internal Medicine

## 2011-12-23 VITALS — BP 130/78 | HR 70 | Temp 97.5°F | Ht 64.0 in | Wt 167.1 lb

## 2011-12-23 DIAGNOSIS — I2510 Atherosclerotic heart disease of native coronary artery without angina pectoris: Secondary | ICD-10-CM

## 2011-12-23 DIAGNOSIS — I472 Ventricular tachycardia: Secondary | ICD-10-CM

## 2011-12-23 DIAGNOSIS — Z48812 Encounter for surgical aftercare following surgery on the circulatory system: Secondary | ICD-10-CM

## 2011-12-23 DIAGNOSIS — Z5189 Encounter for other specified aftercare: Secondary | ICD-10-CM

## 2011-12-23 DIAGNOSIS — I10 Essential (primary) hypertension: Secondary | ICD-10-CM

## 2011-12-23 DIAGNOSIS — L408 Other psoriasis: Secondary | ICD-10-CM

## 2011-12-23 DIAGNOSIS — R259 Unspecified abnormal involuntary movements: Secondary | ICD-10-CM

## 2011-12-23 DIAGNOSIS — R55 Syncope and collapse: Secondary | ICD-10-CM

## 2011-12-23 NOTE — Assessment & Plan Note (Signed)
Progressive R hand "intention tremor" -  Not responding to increase Inderal dose Refer to neuro for eval/tx

## 2011-12-23 NOTE — Assessment & Plan Note (Signed)
Recurrent syncope events early 2013 - ER eval 12/2011 found bradycardia after event>  Dx clarified> cath and EP study done 12/07/11>>ICD placed 12/09/11 Feeling well, no recurrence Hospitalization, tests and tx reviewed with pt in depth today follow up cards and EP as planned, no med changes recommended

## 2011-12-23 NOTE — Assessment & Plan Note (Signed)
S/p derm eval for same in past ([facility] at [facility] derm)> thick scalp "scabbing") rx steroid lotion to use prn ineffective Worse after tx for "lupus" per dtr Refer for 2nd new derm opinion now

## 2011-12-23 NOTE — Progress Notes (Signed)
Subjective:    Patient ID: Erika Rivera, female    DOB: Jan 07, 1936, 76 y.o.   MRN: [id number]  HPI  here for hospital followup - syncope events: Seen in ER 10/02/11 for syncope, standing on porch and "went out", hit head on back of wall -  no seizure or chest pain, momentary loss of consciousness  - dx UTI on Er review 2nd syncope event 10/20/11 at home upon getting out of bed and going to bathroom - "head got fuzzy and i saw myself going down" - dtr witnessed both events - no incontinence or confusion 2 more events 11/2011 prompting return to ER> dx brady and PMVT S/p LHC, EP, echo  aslo requests eval to help with hand tremor - not improved with increase inderal dose at hosp dc  Also requests new opinion on scalp "scab" - tx ongoing by local derm for "lupus", but skin scab "worse" with newest tx  Also reviewed chronic medical issues:  dyslipidemia - on statin since fall 2010  no adv SE - reports 100% compiance with meds -  dtr reports poor pt compliance with diet  hypothyroid - working with endo on same - variable compliance -denies adverse side effects related to current therapy.   arthiritis - reports compliance with ongoing medical treatment and no changes in medication dose or frequency.  denies adverse side effects related to current therapy.   HTN - reports compliance with ongoing medical treatment and no changes in medication dose or frequency. denies adverse side effects related to current therapy. no chest pain, edema or shortness of breath     Past Medical History  Diagnosis Date  . ARTHRITIS, HAND   . ARTHRITIS, LEFT SHOULDER   . HEARING LOSS   . SMOKER   . CEREBROVASCULAR ACCIDENT, HX OF   . HYPERTENSION   . HYPERLIPIDEMIA   . HYPOTHYROIDISM   . OVERACTIVE BLADDER     Review of Systems  Respiratory: Negative for shortness of breath and wheezing.   Cardiovascular: Negative for chest pain and palpitations.  Neurological: Positive for syncope. Negative for  dizziness, facial asymmetry, speech difficulty, weakness, light-headedness and headaches.  continued RUE tremor     Objective:   Physical Exam  BP 130/78  Pulse 70  Temp(Src) 97.5 F (36.4 C) (Oral)  Ht 5\' 4"  (1.626 m)  Wt 167 lb 1.9 oz (75.805 kg)  BMI 28.69 kg/m2  SpO2 97% Wt Readings from Last 3 Encounters:  12/23/11 167 lb 1.9 oz (75.805 kg)  12/07/11 167 lb 12.3 oz (76.1 kg)  12/07/11 167 lb 12.3 oz (76.1 kg)   Constitutional: She appears well-developed and well-nourished. No distress. dtr x2 at side Neck: Normal range of motion. Neck supple. No JVD present. No thyromegaly present.  Cardiovascular: Normal rate, regular rhythm and normal heart sounds.  No murmur heard. No BLE edema. device in L anterior wall without swelling, bruising or erythema around pocket.  Pulmonary/Chest: Effort normal and breath sounds normal. No respiratory distress. She has no wheezes.  Neuro: AAOx3, CN2-12 symmetrically intact - speech fluent, follows 3 step commands and MAE well - gait normal, unaided - RUE intention tremor in hand Psychiatric: She has a normal mood and affect. Her behavior is normal. Judgment and thought content normal.    Lab Results  Component Value Date   WBC 5.8 12/07/2011   HGB 11.8* 12/07/2011   HCT 36.9 12/07/2011   PLT 247 12/07/2011   CHOL 115 12/08/2011   TRIG 83 12/08/2011  HDL 54 12/08/2011   LDLDIRECT 138.6 04/23/2011   ALT 10 04/23/2011   AST 25 04/23/2011   NA 139 12/08/2011   K 3.8 12/08/2011   CL 109 12/08/2011   CREATININE 1.37* 12/08/2011   BUN 25* 12/08/2011   CO2 21 12/08/2011   TSH 3.754 12/04/2011   INR 1.03 12/07/2011        Assessment & Plan:  Syncope - - see below - s/p ICD 12/09/11  Time spent with pt/family today 30 minutes, greater than 50% time spent counseling patient on syncope, tremor and skin problems medication review. Also review of prior records

## 2011-12-23 NOTE — Patient Instructions (Signed)
It was good to see you today. We have reviewed your prior records including labs and tests today Medications reviewed, no changes at this time. we'll make referral to neurology for tremor and new derm for scalp scabs . Our office will contact you regarding appointment(s) once made. Signed application for temporary handicap tag done today Please keep scheduled followup in 3-4 months, call sooner if problems.

## 2011-12-29 ENCOUNTER — Encounter: Payer: Self-pay | Admitting: Neurology

## 2012-01-03 ENCOUNTER — Other Ambulatory Visit: Payer: Self-pay | Admitting: Internal Medicine

## 2012-01-07 ENCOUNTER — Encounter: Payer: Self-pay | Admitting: Physician Assistant

## 2012-01-07 ENCOUNTER — Ambulatory Visit (INDEPENDENT_AMBULATORY_CARE_PROVIDER_SITE_OTHER): Payer: Medicare Other | Admitting: Physician Assistant

## 2012-01-07 VITALS — BP 140/80 | HR 73 | Wt 171.0 lb

## 2012-01-07 DIAGNOSIS — I472 Ventricular tachycardia: Secondary | ICD-10-CM

## 2012-01-07 DIAGNOSIS — L408 Other psoriasis: Secondary | ICD-10-CM

## 2012-01-07 DIAGNOSIS — I2510 Atherosclerotic heart disease of native coronary artery without angina pectoris: Secondary | ICD-10-CM

## 2012-01-07 DIAGNOSIS — I10 Essential (primary) hypertension: Secondary | ICD-10-CM

## 2012-01-07 LAB — MAGNESIUM: Magnesium: 2 mg/dL (ref 1.5–2.5)

## 2012-01-07 LAB — BASIC METABOLIC PANEL
BUN: 21 mg/dL (ref 6–23)
Chloride: 109 mEq/L (ref 96–112)
Glucose, Bld: 76 mg/dL (ref 70–99)
Potassium: 3.5 mEq/L (ref 3.5–5.1)
Sodium: 144 mEq/L (ref 135–145)

## 2012-01-07 MED ORDER — PROPRANOLOL HCL ER 160 MG PO CP24: 160.0000 mg | ORAL_CAPSULE | Freq: Every day | ORAL | Status: DC

## 2012-01-07 NOTE — Progress Notes (Addendum)
[address] [place], [place]  [address] Phone: [phone] Fax:  [phone]  Date:  01/07/2012   Name:  Erika Rivera       DOB:  [birth year] MRN:  [id number]  PCP:  Dr. Felicity Coyer Primary Cardiologist:  Dr. Hillis Range  Primary Electrophysiologist:  Dr. Hillis Range    History of Present Illness: Erika Rivera is a 76 y.o. female who returns for post hospital follow up.  She has a h/o HTN, hyperlipidemia, hypothyroidism and prior cerebral aneurysm clipping.  She was admitted to [facility] 2/28-3/5 with recurrent syncope and documented PVC dependent Torsades / idiopathic polymorphic VT.  Echo: mild LVH, EF 50-55%, grade 1 diast dysfxn, trivial AI.  LHC 12/07/11: prox to mid RCA 75%, EF 55-65%; FFR of RCA lesion negative.  Bradycardia was noted and this limited her Rx.  She underwent dual chamber AICD (St. Jude) implant by Dr. Hillis Range 12/07/11.  She is A paced at 70.  Beta blocker was added back and titrated.  Her e-lytes need to remain stable (K > 4, Mg > 2).  Medical Rx was recommended for tx of her CAD.  She is not on an ASA and she tells me this is b/c of her prior hx of cerebral aneurysm.  She is doing well since d/c.  Denies chest pain, dyspnea, orthopnea, PND, edema, syncope.  No ICD Rx's.  Swelling over device resolving.  No pain.  Taking all meds.    Past Medical History  Diagnosis Date  . ARTHRITIS, HAND   . ARTHRITIS, LEFT SHOULDER   . HEARING LOSS   . SMOKER   . CEREBROVASCULAR ACCIDENT, HX OF   . HYPERTENSION   . HYPERLIPIDEMIA   . HYPOTHYROIDISM   . OVERACTIVE BLADDER   . Cerebral aneurysm     s/p clipping  . CAD (coronary artery disease)     LHC 12/2011: RCA 75% (neg FFR), EF 55-65% - med Tx  . Polymorphic ventricular tachycardia     s/p St Jude AICD (dual chamber) 12/2011;  Echo: mild LVH, EF 50-555, grade 1 diast dysfxn, trivial AI.   Marland Kitchen Torsade de pointes     PVC dependent  . Syncope     2/2 VTach  . Bradycardia     A paced by  AICD    Current Outpatient Prescriptions  Medication Sig Dispense Refill  . BENICAR HCT 40-12.5 MG per tablet TAKE 1 TABLET BY MOUTH DAILY  30 tablet  5  . levothyroxine (SYNTHROID, LEVOTHROID) 175 MCG tablet Take 1 tablet (175 mcg total) by mouth daily.      Marland Kitchen olmesartan-hydrochlorothiazide (BENICAR HCT) 40-12.5 MG per tablet Take 1 tablet by mouth daily.      . propranolol (INDERAL LA) 120 MG 24 hr capsule Take 1 capsule (120 mg total) by mouth daily.  30 capsule  0  . simvastatin (ZOCOR) 20 MG tablet Take 20 mg by mouth every evening.        Allergies: Allergies  Allergen Reactions  . Benadryl Allergy   . Penicillins     History  Substance Use Topics  . Smoking status: Former Smoker -- 0.6 packs/day  . Smokeless tobacco: Not on file   Comment: Widowed, lives with dtr. retired from [employer] and [employer]  . Alcohol Use: No     ROS:  Please see the history of present illness.   All other systems reviewed and negative.   PHYSICAL EXAM: VS:  BP 140/80  Pulse 73  Wt 171 lb (77.565 kg) Well nourished, well developed, in no acute distress HEENT: normal Neck: no JVD Cardiac:  normal S1, S2; RRR; no murmur Lungs:  clear to auscultation bilaterally, no wheezing, rhonchi or rales Abd: soft, nontender, no hepatomegaly Ext: no edema; right wrist without hematoma or bruit Skin: warm and dry Neuro:  CNs 2-12 intact, no focal abnormalities noted  EKG:  A paced, HR 73, TW inversions 2, 3, aVF, V4-6, PVC  ASSESSMENT AND PLAN:  1. CAD  No angina.  Continue med Rx.  She is already on statin and this is managed by PCP.  She is not on an ASA.  Will ask her PCP if this permanently contraindicated.  She does see neuro next month.  May need further input from Dr. Modesto Charon after she has seen him.  Follow up with Dr. Hillis Range as scheduled.   2. Polymorphic ventricular tachycardia  Doing well post ICD.  Her BP could tol more beta blocker.  Will advance Propranolol as noted.  Follow  up with Dr. Hillis Range as noted.  Check BMET and Mg2+ today.    3. HYPERTENSION  Increase Propranolol to 160 mg QD.     Signed, Tereso Newcomer, PA-C  1:09 PM 01/07/2012

## 2012-01-07 NOTE — Patient Instructions (Signed)
Your physician recommends that you schedule a follow-up appointment as scheduled with Dr Johney Frame Your physician recommends that you return for lab work drawn today (BMP and Magnesium) Your physician has recommended you make the following change in your medication: INCREASE Propranolol to 160 mg daily

## 2012-01-08 ENCOUNTER — Telehealth: Payer: Self-pay | Admitting: *Deleted

## 2012-01-08 ENCOUNTER — Other Ambulatory Visit: Payer: Self-pay | Admitting: *Deleted

## 2012-01-08 DIAGNOSIS — I10 Essential (primary) hypertension: Secondary | ICD-10-CM

## 2012-01-08 MED ORDER — OLMESARTAN MEDOXOMIL 40 MG PO TABS: 40.0000 mg | ORAL_TABLET | Freq: Every day | ORAL | Status: DC

## 2012-01-08 MED ORDER — POTASSIUM CHLORIDE CRYS ER 20 MEQ PO TBCR: 20.0000 meq | EXTENDED_RELEASE_TABLET | ORAL | Status: DC

## 2012-01-08 NOTE — Telephone Encounter (Signed)
lmom for ptcb to discuss lab results and med changes. I will send over the [facility]. I also scheduled nurse visit for BP check same day as BMET to have these done on 4/11. Danielle Rankin

## 2012-01-08 NOTE — Telephone Encounter (Signed)
Called pharmacy to make sure new benicar rx rcvd, pharmacist states has not come through yet electronically, so I gave verbal order,Erika Rivera

## 2012-01-08 NOTE — Telephone Encounter (Signed)
Message copied by Tarri Fuller on Fri Jan 08, 2012  4:50 PM ------      Message from: [place], [place] T      Created: Thu Jan 07, 2012  5:27 PM       K+ needs to be at least 4.      With renal insufficiency I do not really want her taking K+ on a daily basis      Eliminating HCTZ may help.            Have her take one dose of K+ 20 mEq (one dose only)      Stop Benicar/HCT      Start Benicar 40 mg QD      Repeat BMET in one week and get a BP check at that time      Tereso Newcomer, PA-C  5:26 PM 01/07/2012

## 2012-01-08 NOTE — Telephone Encounter (Signed)
rx called in today for the K+ and benicar . Danielle Rankin

## 2012-01-14 ENCOUNTER — Ambulatory Visit (INDEPENDENT_AMBULATORY_CARE_PROVIDER_SITE_OTHER): Payer: Medicare Other

## 2012-01-14 ENCOUNTER — Other Ambulatory Visit (INDEPENDENT_AMBULATORY_CARE_PROVIDER_SITE_OTHER): Payer: Medicare Other

## 2012-01-14 VITALS — BP 130/78 | HR 72 | Wt 173.8 lb

## 2012-01-14 DIAGNOSIS — I10 Essential (primary) hypertension: Secondary | ICD-10-CM

## 2012-01-14 LAB — BASIC METABOLIC PANEL
BUN: 24 mg/dL — ABNORMAL HIGH (ref 6–23)
CO2: 27 mEq/L (ref 19–32)
Chloride: 110 mEq/L (ref 96–112)
Creatinine, Ser: 1.4 mg/dL — ABNORMAL HIGH (ref 0.4–1.2)
Potassium: 3.6 mEq/L (ref 3.5–5.1)

## 2012-01-14 NOTE — Progress Notes (Signed)
Patient came to office for B/P check 

## 2012-01-15 ENCOUNTER — Telehealth: Payer: Self-pay | Admitting: *Deleted

## 2012-01-15 DIAGNOSIS — I2510 Atherosclerotic heart disease of native coronary artery without angina pectoris: Secondary | ICD-10-CM

## 2012-01-15 MED ORDER — POTASSIUM CHLORIDE CRYS ER 10 MEQ PO TBCR
EXTENDED_RELEASE_TABLET | ORAL | Status: DC
Start: 2012-01-15 — End: 2013-04-18

## 2012-01-15 NOTE — Telephone Encounter (Signed)
Message copied by Tarri Fuller on Fri Jan 15, 2012 11:54 AM ------      Message from: [place], [place] T      Created: Fri Jan 15, 2012  8:08 AM       Take K+ 10 mEq on Mon, Wed, Fri only      Repeat BMET in 10 days      Tereso Newcomer, [place]  8:08 AM 01/15/2012

## 2012-01-15 NOTE — Telephone Encounter (Signed)
Pt's daughter Cherly Hensen notified of lab results and to start pt on K+ 10 meq today, rx sent in today [facility], repeat bmet 01/25/12 Danielle Rankin

## 2012-01-15 NOTE — Progress Notes (Signed)
[address] [place], [place]  11:18 AM 01/15/2012

## 2012-01-25 ENCOUNTER — Other Ambulatory Visit (INDEPENDENT_AMBULATORY_CARE_PROVIDER_SITE_OTHER): Payer: Medicare Other

## 2012-01-25 ENCOUNTER — Other Ambulatory Visit: Payer: Self-pay | Admitting: Endocrinology

## 2012-01-25 DIAGNOSIS — I2510 Atherosclerotic heart disease of native coronary artery without angina pectoris: Secondary | ICD-10-CM

## 2012-01-26 ENCOUNTER — Telehealth: Payer: Self-pay | Admitting: *Deleted

## 2012-01-26 DIAGNOSIS — I10 Essential (primary) hypertension: Secondary | ICD-10-CM

## 2012-01-26 LAB — BASIC METABOLIC PANEL
Chloride: 109 mEq/L (ref 96–112)
Creatinine, Ser: 1.4 mg/dL — ABNORMAL HIGH (ref 0.4–1.2)
Potassium: 4.4 mEq/L (ref 3.5–5.1)

## 2012-01-26 NOTE — Telephone Encounter (Signed)
lmom labs ok, K+ good. I schedule repeat bmet to be done on 5/13, lm that if this date was not good tcb and rsc lab appt. Danielle Rankin

## 2012-01-26 NOTE — Telephone Encounter (Signed)
Message copied by Tarri Fuller on Tue Jan 26, 2012  3:31 PM ------      Message from: [place], [place] T      Created: Tue Jan 26, 2012  2:23 PM       K+ is good      With renal insuff, I want to keep a close eye on this      Repeat BMET in 3 weeks      Tereso Newcomer, PA-C  2:23 PM 01/26/2012

## 2012-01-27 ENCOUNTER — Ambulatory Visit: Payer: Medicare Other | Admitting: Internal Medicine

## 2012-01-28 ENCOUNTER — Other Ambulatory Visit: Payer: Self-pay | Admitting: *Deleted

## 2012-01-28 ENCOUNTER — Other Ambulatory Visit: Payer: Self-pay | Admitting: Endocrinology

## 2012-01-28 MED ORDER — LEVOTHYROXINE SODIUM 175 MCG PO TABS: 150.0000 ug | ORAL_TABLET | Freq: Every day | ORAL | Status: DC

## 2012-01-28 NOTE — Telephone Encounter (Signed)
R'cd call from daughter for refill of Levothyroxine.

## 2012-02-02 ENCOUNTER — Telehealth: Payer: Self-pay | Admitting: Internal Medicine

## 2012-02-02 NOTE — Telephone Encounter (Signed)
PT FEELS LIKE DEVICE IS MOVING AROUND IN HER CHEST, IS THIS NORMAL?

## 2012-02-02 NOTE — Telephone Encounter (Signed)
Spoke with [name](daughter) patient concerned that the ICD is moving.  I explained that it is sutured in place and told if she is concerned we can see her in the office to check.  Patient was reassured and no appointment is needed at this time.

## 2012-02-15 ENCOUNTER — Other Ambulatory Visit (INDEPENDENT_AMBULATORY_CARE_PROVIDER_SITE_OTHER): Payer: Medicare Other

## 2012-02-15 DIAGNOSIS — I10 Essential (primary) hypertension: Secondary | ICD-10-CM

## 2012-02-16 LAB — BASIC METABOLIC PANEL
CO2: 25 mEq/L (ref 19–32)
GFR: 46.54 mL/min — ABNORMAL LOW (ref >60.00)
Glucose, Bld: 74 mg/dL (ref 70–99)
Potassium: 4.3 mEq/L (ref 3.5–5.1)
Sodium: 141 mEq/L (ref 135–145)

## 2012-02-17 ENCOUNTER — Ambulatory Visit: Payer: Medicare Other | Admitting: Neurology

## 2012-02-17 ENCOUNTER — Telehealth: Payer: Self-pay | Admitting: Internal Medicine

## 2012-02-17 ENCOUNTER — Telehealth: Payer: Self-pay | Admitting: *Deleted

## 2012-02-17 DIAGNOSIS — I10 Essential (primary) hypertension: Secondary | ICD-10-CM

## 2012-02-17 NOTE — Telephone Encounter (Signed)
Since pt is still prone to syncope episodes and family was told not to leave her alone, can she live by herself is the question because patient is about to move and the landlord said patient can not have anyone living with her unless it is a caregiver

## 2012-02-17 NOTE — Telephone Encounter (Signed)
Have lab done before JA appt 6/26; order placed today for bmet 03/28/12, lmom for ptcb labs good and repeat bmet 03/28/12

## 2012-02-17 NOTE — Telephone Encounter (Signed)
Message copied by Tarri Fuller on Wed Feb 17, 2012 12:23 PM ------      Message from: [place], [place] T      Created: Tue Feb 16, 2012  2:29 PM       Looks good      Repeat BMET in one month      [place], [place]  2:29 PM 02/16/2012

## 2012-02-17 NOTE — Telephone Encounter (Signed)
Spoke with daughter  She is going to drop off forms for [place] to fill out for her mom.  These forms say that she is her caregiver

## 2012-02-23 ENCOUNTER — Telehealth: Payer: Self-pay | Admitting: Internal Medicine

## 2012-02-23 NOTE — Telephone Encounter (Signed)
Walk in pt Form " Pt Dropped Off [address], Reasonable Accomidations/Modifications Request and Verification" form to be completed Sent to [facility]  02/23/12/KM

## 2012-02-25 ENCOUNTER — Ambulatory Visit (INDEPENDENT_AMBULATORY_CARE_PROVIDER_SITE_OTHER): Payer: Medicare Other | Admitting: Neurology

## 2012-02-25 ENCOUNTER — Encounter: Payer: Self-pay | Admitting: Neurology

## 2012-02-25 VITALS — BP 140/90 | HR 76 | Wt 175.0 lb

## 2012-02-25 DIAGNOSIS — R259 Unspecified abnormal involuntary movements: Secondary | ICD-10-CM

## 2012-02-25 MED ORDER — CARBIDOPA-LEVODOPA 25-100 MG PO TABS: ORAL_TABLET | ORAL | Status: DC

## 2012-02-25 NOTE — Patient Instructions (Signed)
Sinemet (levodopa/carbidopa) 100/25mg tabs  take 1/2 tablet at breakfast for 5 days, then take 1/2 tablet at breakfast and dinner for 5 days, then take 1/2 tablet at breakfast, lunch and dinner for 5 days, then take 1 tablet at breakfast, 1/2 at lunch, 1/2 at dinner for 5 days then take 1 tablet at breakfast, 1/2 at lunch, 1 at dinner for 5 days,  then take 1 tablet at breakfast, lunch and dinner for 5 days.  

## 2012-02-25 NOTE — Progress Notes (Signed)
Dear Dr. Felicity Coyer,  Thank you for having me see Erika Rivera in consultation today at [facility] for her problem with right arm tremor.  As you may recall, she is a 76 y.o. year old female with a history of  hypothyroidism, bradycardia and syncope with recent pacemaker placement who has had a 3 year history of tremor that involves the right arm.  It is worse when eating or using the arm.  She denies dexterity problems with the hand.  She denies stiffness in the upper limbs.  Her walking has gotten worse.  She does not feel the propranolol has helped her tremor.  She denies falls, incontinence, REM behavior symptoms or dysphagia.  She does not drink EtOH so cannot say that has helped the tremor.   Past Medical History  Diagnosis Date  . ARTHRITIS, HAND   . ARTHRITIS, LEFT SHOULDER   . HEARING LOSS   . SMOKER   . CEREBROVASCULAR ACCIDENT, HX OF   . HYPERTENSION   . HYPERLIPIDEMIA   . HYPOTHYROIDISM   . OVERACTIVE BLADDER   . Cerebral aneurysm     s/p clipping  . CAD (coronary artery disease)     LHC 12/2011: RCA 75% (neg FFR), EF 55-65% - med Tx  . Polymorphic ventricular tachycardia     s/p St Jude AICD (dual chamber) 12/2011;  Echo: mild LVH, EF 50-555, grade 1 diast dysfxn, trivial AI.   Marland Kitchen Torsade de pointes     PVC dependent  . Syncope     2/2 VTach  . Bradycardia     A paced by AICD  - she has an undiagnosed plaque like rash of the head that is currently getting worked up. - history of remote cerebral aneurysm clipping of left ICA, with known encephalomalacia of left frontotemporal lobes.  Past Surgical History  Procedure Date  . No past surgeries     History   Social History  . Marital Status: Widowed    Spouse Name: N/A    Number of Children: N/A  . Years of Education: N/A   Social History Main Topics  . Smoking status: Former Smoker -- 0.6 packs/day    Quit date: 11/28/2011  . Smokeless tobacco: Never Used   Comment: Widowed, lives with dtr. retired  from [employer] and [employer])  . Alcohol Use: No  . Drug Use: No  . Sexually Active: None   Other Topics Concern  . None   Social History Narrative  . None    Family History  Problem Relation Age of Onset  . Gout Father   . Arthritis Other   . Hypertension Other   . Kidney disease Other   . Heart disease Other   . Thyroid disease Other     Current Outpatient Prescriptions on File Prior to Visit  Medication Sig Dispense Refill  . levothyroxine (SYNTHROID, LEVOTHROID) 175 MCG tablet Take 1 tablet (175 mcg total) by mouth daily.  30 tablet  3  . olmesartan (BENICAR) 40 MG tablet Take 1 tablet (40 mg total) by mouth daily.  30 tablet  11  . potassium chloride (K-DUR,KLOR-CON) 10 MEQ tablet TAKE ONLY ON MON, WED, FRI'S  30 tablet  11  . propranolol (INDERAL LA) 160 MG SR capsule Take 1 capsule (160 mg total) by mouth daily.  30 capsule  3  . simvastatin (ZOCOR) 20 MG tablet Take 20 mg by mouth every evening.      . carbidopa-levodopa (SINEMET) 25-100 MG per  tablet increase to 1 tablet three times a day as directed  90 tablet  2    Allergies  Allergen Reactions  . Diphenhydramine Hcl   . Penicillins       ROS:  13 systems were reviewed and are notable for no dysarthria or dysphagia, no incontinence, no REMBD symptoms.  All other review of systems are unremarkable.   Examination:  Filed Vitals:   02/25/12 1039  BP: 140/90  Pulse: 76  Weight: 175 lb (79.379 kg)     In general, slightly unkempt women.  Cardiovascular: The patient has a regular rate and rhythm and no carotid bruits.  Fundoscopy:  Limited by constriction.  Mental status:   The patient is oriented to person, place and time. Recent and remote memory are intact. Attention span and concentration are normal. Language including repetition, naming, following commands are intact. Fund of knowledge of current and historical events, as well as vocabulary are normal.  Cranial Nerves: Pupils are equally  round and reactive to light. Visual fields full to confrontation. Extraocular movements are intact without nystagmus, good downward saccades. Facial sensation and muscles of mastication are intact. Muscles of facial expression are symmetric but blunted. Hearing intact to bilateral finger rub. Tongue protrusion, uvula, palate midline.  Shoulder shrug intact  Motor:  Mildly increased tone bilaterally in the arms.  Some bradykinesia but no decreasing amplitude of FFM.  Mild fine resting tremor, and fine postural tremor right hand.  5/5 muscle strength bilaterally.  Reflexes:  Quiet throughout.  Toes down  Coordination:  Terminal tremor, F2N  Sensation is intact to temperature, decreased vibration in feet, normal position.  Gait and Station are slow, wide based.  +retropulsion. Romberg is negative   Impression/Recs: 1.  Right sided tremor - ?Parkinson's tremor - start Sinemet and increase to 100/25 tid.   We will see the patient back in 2 months.  Thank you for having [place] see Erika Rivera in consultation.  Feel free to contact me with any questions.  [name], MD [facility], [place] [address] [place], [place] [address] Phone: [phone] Fax: [phone].

## 2012-02-25 NOTE — Telephone Encounter (Signed)
Cherly Hensen ( Daughter) Aware [address] paperwork Ready  For Pick Up.  02/25/12/KM

## 2012-03-28 ENCOUNTER — Other Ambulatory Visit (INDEPENDENT_AMBULATORY_CARE_PROVIDER_SITE_OTHER): Payer: Medicare Other

## 2012-03-28 ENCOUNTER — Ambulatory Visit (INDEPENDENT_AMBULATORY_CARE_PROVIDER_SITE_OTHER): Payer: Medicare Other

## 2012-03-28 DIAGNOSIS — I10 Essential (primary) hypertension: Secondary | ICD-10-CM

## 2012-03-28 LAB — BASIC METABOLIC PANEL
CO2: 25 mEq/L (ref 19–32)
Chloride: 111 mEq/L (ref 96–112)
Creatinine, Ser: 1.4 mg/dL — ABNORMAL HIGH (ref 0.4–1.2)

## 2012-03-28 MED ORDER — AMLODIPINE BESYLATE 10 MG PO TABS: 10.0000 mg | ORAL_TABLET | Freq: Every day | ORAL | Status: DC

## 2012-03-28 NOTE — Patient Instructions (Signed)
Start Amlodipine 10mg  daily  Keep your appt with Dr Johney Frame on 03/30/12.

## 2012-03-28 NOTE — Progress Notes (Signed)
Pt here for lab and requesting a bp check while here.  Her daughter thought she was stumbling around and was concerned bp might be too low.  Pt states that is how she always walks.  BP=184/86 P=72, Weight=172.4#.  Pt denies dizziness, sob, pain, weakness, etc.  Per Dr Eden Emms, (dod), pt to start amlodipine 10mg  qd and see Dr Johney Frame or Tereso Newcomer within 6 weeks.  Pt already has a f/u scheduled with Dr Johney Frame on wed. And was reminded of it.  Rx sent to [facility] on [address] per pt request. Pt to call back if further problems.  I also recommended that she get a bp monitor so that she can monitor her bp at home and call if it does not go down or if it gets too low.

## 2012-03-30 ENCOUNTER — Encounter: Payer: Self-pay | Admitting: Internal Medicine

## 2012-03-30 ENCOUNTER — Ambulatory Visit (INDEPENDENT_AMBULATORY_CARE_PROVIDER_SITE_OTHER): Payer: Medicare Other | Admitting: Internal Medicine

## 2012-03-30 ENCOUNTER — Other Ambulatory Visit (INDEPENDENT_AMBULATORY_CARE_PROVIDER_SITE_OTHER): Payer: Medicare Other

## 2012-03-30 VITALS — BP 142/84 | HR 74 | Temp 98.1°F | Ht 65.0 in | Wt 171.1 lb

## 2012-03-30 VITALS — BP 153/84 | HR 76 | Resp 18 | Ht 65.0 in | Wt 171.1 lb

## 2012-03-30 DIAGNOSIS — I472 Ventricular tachycardia: Secondary | ICD-10-CM

## 2012-03-30 DIAGNOSIS — R259 Unspecified abnormal involuntary movements: Secondary | ICD-10-CM

## 2012-03-30 DIAGNOSIS — E039 Hypothyroidism, unspecified: Secondary | ICD-10-CM

## 2012-03-30 DIAGNOSIS — I2510 Atherosclerotic heart disease of native coronary artery without angina pectoris: Secondary | ICD-10-CM

## 2012-03-30 DIAGNOSIS — I10 Essential (primary) hypertension: Secondary | ICD-10-CM

## 2012-03-30 LAB — ICD DEVICE OBSERVATION
AL THRESHOLD: 0.5 V
ATRIAL PACING ICD: 90 pct
BAMS-0001: 170 {beats}/min
BAMS-0003: 70 {beats}/min
CHARGE TIME: 7.4 s
DEVICE MODEL ICD: 7016049
HV IMPEDENCE: 72 Ohm
MODE SWITCH EPISODES: 2
PACEART VT: 0
RV LEAD THRESHOLD: 0.75 V
TOT-0006: 20130305000000
TOT-0007: 4
TZON-0003SLOWVT: 350 ms
TZON-0004SLOWVT: 16
TZON-0010SLOWVT: 40 ms
VENTRICULAR PACING ICD: 7.7 pct

## 2012-03-30 MED ORDER — LEVOTHYROXINE SODIUM 150 MCG PO TABS: 150.0000 ug | ORAL_TABLET | Freq: Every day | ORAL | Status: DC

## 2012-03-30 NOTE — Assessment & Plan Note (Signed)
BP Readings from Last 3 Encounters:  03/30/12 142/84  03/30/12 153/84  02/25/12 140/90   Added Bbloc to ARB/hct 04/2011 for BP control and RUE tremor symptoms  Amlodipine added by cards 03/2012 Encouraged compliance -no changes at this time

## 2012-03-30 NOTE — Assessment & Plan Note (Signed)
Progressive R hand "intention tremor" -  S/p neuro eval/tx sprng 2013 >> dx BET on sinemet in addition to Inderal - improved The current medical regimen is effective;  continue present plan and medications.

## 2012-03-30 NOTE — Patient Instructions (Addendum)
It was good to see you today. We have reviewed your prior records including labs and tests today Medications reviewed, no changes at this time. Test(s) ordered today. Your results will be called to you after review (48-72hours after test completion). If any changes need to be made, you will be notified at that time. Please keep scheduled followup in 6 months, call sooner if problems.

## 2012-03-30 NOTE — Addendum Note (Signed)
Addended by: Rene Paci A on: 03/30/2012 04:52 PM   Modules accepted: Orders

## 2012-03-30 NOTE — Patient Instructions (Addendum)
Your physician recommends that you schedule a follow-up appointment in: 3 months with Lilian Coma  Your physician wants you to follow-up in: March 2014 with Dr Johney Frame Bonita Quin will receive a reminder letter in the mail two months in advance. If you don't receive a letter, please call our office to schedule the follow-up appointment.  Remote monitoring is used to monitor your Pacemaker of ICD from home. This monitoring reduces the number of office visits required to check your device to one time per year. It allows [place] to keep an eye on the functioning of your device to ensure it is working properly. You are scheduled for a device check from home on 07/04/2012. You may send your transmission at any time that day. If you have a wireless device, the transmission will be sent automatically. After your physician reviews your transmission, you will receive a postcard with your next transmission date.

## 2012-03-30 NOTE — Progress Notes (Signed)
Subjective:    Patient ID: Erika Rivera, female    DOB: 07/16/1936, 76 y.o.   MRN: [id number]  HPI  here for followup - reviewed chronic medical issues  Hx syncope events: Seen in ER 10/02/11 for syncope, standing on porch and "went out", hit head on back of wall -  no seizure or chest pain, momentary loss of consciousness  - dx UTI on Er review 2nd syncope event 10/20/11 at home upon getting out of bed and going to bathroom - "head got fuzzy and i saw myself going down" - dtr witnessed both events - no incontinence or confusion 2 more events 11/2011 prompting return to ER> dx brady and PMVT S/p LHC, EP, echo> ICD placed 12/2011 - follow with EP for same  dyslipidemia - on statin since fall 2010  no adv SE - reports 100% compiance with meds -  dtr reports poor pt compliance with diet  hypothyroid - working with endo on same - variable compliance -denies adverse side effects related to current therapy.   arthiritis - reports compliance with ongoing medical treatment and no changes in medication dose or frequency.  denies adverse side effects related to current therapy.   HTN - reports compliance with ongoing medical treatment and no changes in medication dose or frequency. denies adverse side effects related to current therapy. no chest pain, edema or shortness of breath     Past Medical History  Diagnosis Date  . ARTHRITIS, HAND   . ARTHRITIS, LEFT SHOULDER   . HEARING LOSS   . SMOKER   . CEREBROVASCULAR ACCIDENT, HX OF   . HYPERTENSION   . HYPERLIPIDEMIA   . HYPOTHYROIDISM   . OVERACTIVE BLADDER   . Cerebral aneurysm     s/p clipping  . CAD (coronary artery disease)     LHC 12/2011: RCA 75% (neg FFR), EF 55-65% - med Tx  . Polymorphic ventricular tachycardia     s/p St Jude AICD (dual chamber) 12/2011;  Echo: mild LVH, EF 50-555, grade 1 diast dysfxn, trivial AI.   Marland Kitchen Torsade de pointes     PVC dependent  . Syncope     2/2 VTach  . Bradycardia     A paced by AICD     Review of Systems  Respiratory: Negative for shortness of breath and wheezing.   Cardiovascular: Negative for chest pain and palpitations.  Neurological: Negative for dizziness, facial asymmetry, speech difficulty, weakness, light-headedness and headaches.       Objective:   Physical Exam  BP 142/84  Pulse 74  Temp 98.1 F (36.7 C) (Oral)  Ht 5\' 5"  (1.651 m)  Wt 171 lb 1.9 oz (77.62 kg)  BMI 28.48 kg/m2  SpO2 97% Wt Readings from Last 3 Encounters:  03/30/12 171 lb 1.9 oz (77.62 kg)  03/30/12 171 lb 1.9 oz (77.62 kg)  02/25/12 175 lb (79.379 kg)   Constitutional: She appears well-developed and well-nourished. No distress. dtr x2 at side Neck: Normal range of motion. Neck supple. No JVD present. No thyromegaly present.  Cardiovascular: Normal rate, regular rhythm and normal heart sounds.  No murmur heard. No BLE edema. device in L anterior wall without swelling, bruising or erythema around pocket.  Pulmonary/Chest: Effort normal and breath sounds normal. No respiratory distress. She has no wheezes.  Neuro: AAOx3, CN2-12 symmetrically intact - speech fluent, follows 3 step commands and MAE well - gait normal, unaided - min RUE intention tremor in hand Psychiatric: She has a normal mood and  affect. Her behavior is normal. Judgment and thought content normal.    Lab Results  Component Value Date   WBC 5.8 12/07/2011   HGB 11.8* 12/07/2011   HCT 36.9 12/07/2011   PLT 247 12/07/2011   CHOL 115 12/08/2011   TRIG 83 12/08/2011   HDL 54 12/08/2011   LDLDIRECT 138.6 04/23/2011   ALT 10 04/23/2011   AST 25 04/23/2011   NA 142 03/28/2012   K 4.0 03/28/2012   CL 111 03/28/2012   CREATININE 1.4* 03/28/2012   BUN 23 03/28/2012   CO2 25 03/28/2012   TSH 3.754 12/04/2011   INR 1.03 12/07/2011        Assessment & Plan:   See problem list. Medications and labs reviewed today.  Severe "cradle cap" scalp infection (fungal) - ?not improved on antifungal pills - dr Hortense Ramal - follow up 04/12/12 planned

## 2012-03-30 NOTE — Assessment & Plan Note (Signed)
hx noncompliance with meds summer 2012 Started working with endo for same fall 2012 - reports now taking meds and stabilized 12/2011 Check today - adjust if needed  Lab Results  Component Value Date   TSH 3.754 12/04/2011

## 2012-03-31 ENCOUNTER — Encounter: Payer: Self-pay | Admitting: Internal Medicine

## 2012-03-31 NOTE — Progress Notes (Signed)
PCP: Rene Paci, MD  Erika Rivera is a 76 y.o. female who presents today for routine electrophysiology followup.  Since having her ICD implanted, the patient reports doing very well.  Today, she denies symptoms of palpitations, chest pain, shortness of breath,  lower extremity edema, dizziness, presyncope, syncope, or ICD shocks.  The patient is otherwise without complaint today.   Past Medical History  Diagnosis Date  . ARTHRITIS, HAND   . ARTHRITIS, LEFT SHOULDER   . HEARING LOSS   . SMOKER   . CEREBROVASCULAR ACCIDENT, HX OF   . HYPERTENSION   . HYPERLIPIDEMIA   . HYPOTHYROIDISM   . OVERACTIVE BLADDER   . Cerebral aneurysm     s/p clipping  . CAD (coronary artery disease)     LHC 12/2011: RCA 75% (neg FFR), EF 55-65% - med Tx  . Polymorphic ventricular tachycardia     s/p St Jude AICD (dual chamber) 12/2011;  Echo: mild LVH, EF 50-555, grade 1 diast dysfxn, trivial AI.   Marland Kitchen Torsade de pointes     PVC dependent  . Syncope     2/2 VTach  . Bradycardia     A paced by AICD   Past Surgical History  Procedure Date  . Cardiac defibrillator placement 12/07/11    SJM ICD implanted for recurrent Torsades with syncope    Current Outpatient Prescriptions  Medication Sig Dispense Refill  . amLODipine (NORVASC) 10 MG tablet Take 1 tablet (10 mg total) by mouth daily.  30 tablet  6  . carbidopa-levodopa (SINEMET) 25-100 MG per tablet increase to 1 tablet three times a day as directed  90 tablet  2  . olmesartan (BENICAR) 40 MG tablet Take 1 tablet (40 mg total) by mouth daily.  30 tablet  11  . potassium chloride (K-DUR,KLOR-CON) 10 MEQ tablet TAKE ONLY ON MON, WED, FRI'S  30 tablet  11  . propranolol (INDERAL LA) 160 MG SR capsule Take 1 capsule (160 mg total) by mouth daily.  30 capsule  3  . simvastatin (ZOCOR) 20 MG tablet Take 20 mg by mouth every evening.      Marland Kitchen levothyroxine (SYNTHROID, LEVOTHROID) 150 MCG tablet Take 1 tablet (150 mcg total) by mouth daily.  30 tablet  3     Physical Exam: Filed Vitals:   03/30/12 1234  BP: 153/84  Pulse: 76  Resp: 18  Height: 5\' 5"  (1.651 m)  Weight: 171 lb 1.9 oz (77.62 kg)    GEN- The patient is elderly appearing, alert and oriented x 3 today.   Head- normocephalic, atraumatic Eyes-  Sclera clear, conjunctiva pink Ears- hearing intact Oropharynx- clear Lungs- Clear to ausculation bilaterally, normal work of breathing Chest- ICD pocket is well healed Heart- Regular rate and rhythm, no murmurs, rubs or gallops, PMI not laterally displaced GI- soft, NT, ND, + BS Extremities- no clubbing, cyanosis, or edema  ICD interrogation- reviewed in detail today,  See PACEART report  Assessment and Plan:

## 2012-03-31 NOTE — Assessment & Plan Note (Signed)
Stable No change required today  

## 2012-03-31 NOTE — Assessment & Plan Note (Signed)
Moderate RCA stenosis with negative pressure wire  No ischemic symptoms Continue medical therapy

## 2012-03-31 NOTE — Assessment & Plan Note (Signed)
Normal ICD function See Pace Art report No changes today  No further episodes Continue propranolol long term

## 2012-04-29 ENCOUNTER — Ambulatory Visit (INDEPENDENT_AMBULATORY_CARE_PROVIDER_SITE_OTHER): Payer: Medicare Other | Admitting: Neurology

## 2012-04-29 ENCOUNTER — Encounter: Payer: Self-pay | Admitting: Neurology

## 2012-04-29 VITALS — BP 124/76 | HR 76 | Wt 174.0 lb

## 2012-04-29 DIAGNOSIS — R259 Unspecified abnormal involuntary movements: Secondary | ICD-10-CM

## 2012-04-29 MED ORDER — CARBIDOPA-LEVODOPA 25-100 MG PO TABS: ORAL_TABLET | ORAL | Status: DC

## 2012-04-29 NOTE — Progress Notes (Signed)
Dear Dr. Felicity Coyer,  I saw  Erika Rivera back in [facility] for her problem with right sided tremor.  As you may recall, she is a 76 y.o. year old female with a history of remote cerebral aneurysm, s/p clipping with left frontotemporal encephalomalacia who I see for mainly unilateral tremor that has been unresponsive to propranolol.  I felt that she had some mild parkinsonian features so I started her on Sinemet 100/25 tid last visit.  She has not noted a difference in her tremor.  She is having no problems with her medications otherwise.  The tremor bothers her most with holding objects.     Medical history, social history, and family history were reviewed and have not changed since the last clinic visit.  Current Outpatient Prescriptions on File Prior to Visit  Medication Sig Dispense Refill  . amLODipine (NORVASC) 10 MG tablet Take 1 tablet (10 mg total) by mouth daily.  30 tablet  6  . levothyroxine (SYNTHROID, LEVOTHROID) 150 MCG tablet Take 1 tablet (150 mcg total) by mouth daily.  30 tablet  3  . olmesartan (BENICAR) 40 MG tablet Take 1 tablet (40 mg total) by mouth daily.  30 tablet  11  . potassium chloride (K-DUR,KLOR-CON) 10 MEQ tablet TAKE ONLY ON MON, WED, FRI'S  30 tablet  11  . propranolol (INDERAL LA) 160 MG SR capsule Take 1 capsule (160 mg total) by mouth daily.  30 capsule  3  . simvastatin (ZOCOR) 20 MG tablet Take 20 mg by mouth every evening.      Marland Kitchen DISCONTD: carbidopa-levodopa (SINEMET) 25-100 MG per tablet increase to 1 tablet three times a day as directed  90 tablet  2    Allergies  Allergen Reactions  . Diphenhydramine Hcl   . Penicillins     ROS:  13 systems were reviewed and  are unremarkable.  Exam: . Filed Vitals:   04/29/12 1439  BP: 124/76  Pulse: 76  Weight: 174 lb (78.926 kg)   In general, slightly unkempt women.   Cranial Nerves:  Pupils are equally round and reactive to light. Visual fields full to confrontation. Extraocular  movements are intact without nystagmus, good downward saccades. Facial sensation and muscles of mastication are intact. Muscles of facial expression are symmetric but blunted. Hearing intact to bilateral finger rub. Tongue protrusion, uvula, palate midline. Shoulder shrug intact  Motor: Mildly increased tone bilaterally in the arms. Perhaps some decreasing amplitude with FFM of right hand.  . Mild fine resting tremor, and fine postural tremor right hand.   Postural looks worse than resting.  5/5 muscle strength bilaterally.   Reflexes:  Quiet throughout.   Impression/Recommendations:  1.  Mainly right sided postural tremor - I am now not sure this is parkinsonian.  The mainly postural component makes this less likely.  I will increase the Sinemet to 2 tabs tid of 100/25 however to see if there is a response.   The patient will see Dr. Arbutus Leas in consultation for her tremor after I leave.   [name], MD [facility], [place]

## 2012-04-29 NOTE — Patient Instructions (Signed)
increase Sinemet 100/25 to 2 pills three times per day.  For 5 days, take 1 in the a.m., 1 at noon, 2 at dinner then for 5 days, take 2 in the a.m., 1 at noon, 2 at dinner then from then on take 2 three times per day.

## 2012-05-02 ENCOUNTER — Other Ambulatory Visit: Payer: Self-pay | Admitting: Physician Assistant

## 2012-06-10 ENCOUNTER — Other Ambulatory Visit: Payer: Self-pay | Admitting: Internal Medicine

## 2012-06-30 ENCOUNTER — Encounter: Payer: Self-pay | Admitting: Physician Assistant

## 2012-06-30 ENCOUNTER — Telehealth: Payer: Self-pay | Admitting: *Deleted

## 2012-06-30 ENCOUNTER — Ambulatory Visit (INDEPENDENT_AMBULATORY_CARE_PROVIDER_SITE_OTHER): Payer: Medicare Other | Admitting: Physician Assistant

## 2012-06-30 VITALS — BP 120/64 | HR 76 | Ht 65.0 in | Wt 171.4 lb

## 2012-06-30 DIAGNOSIS — I472 Ventricular tachycardia: Secondary | ICD-10-CM

## 2012-06-30 DIAGNOSIS — I2510 Atherosclerotic heart disease of native coronary artery without angina pectoris: Secondary | ICD-10-CM

## 2012-06-30 DIAGNOSIS — I10 Essential (primary) hypertension: Secondary | ICD-10-CM

## 2012-06-30 DIAGNOSIS — E785 Hyperlipidemia, unspecified: Secondary | ICD-10-CM

## 2012-06-30 LAB — BASIC METABOLIC PANEL
BUN: 22 mg/dL (ref 6–23)
CO2: 26 mEq/L (ref 19–32)
Chloride: 106 mEq/L (ref 96–112)
Creatinine, Ser: 1.7 mg/dL — ABNORMAL HIGH (ref 0.4–1.2)
Potassium: 4.1 mEq/L (ref 3.5–5.1)

## 2012-06-30 LAB — MAGNESIUM: Magnesium: 2.1 mg/dL (ref 1.5–2.5)

## 2012-06-30 NOTE — Progress Notes (Signed)
[address] [place], [place]  [address] Phone: [phone] Fax:  [phone]  Date:  06/30/2012   Name:  Erika Rivera   DOB:  04-30-1936   MRN:  [id number]  PCP:  Rene Paci, MD  Primary Cardiologist/Primary Electrophysiologist:  Dr. Hillis Range    History of Present Illness: Erika Rivera is a 76 y.o. female who returns for follow up.    She has a h/o HTN, HL, hypothyroidism and prior cerebral aneurysm clipping. She was admitted to [facility] 2/28-3/5 with recurrent syncope and documented PVC dependent Torsades / idiopathic polymorphic VT. Echo: mild LVH, EF 50-55%, grade 1 diast dysfxn, trivial AI. LHC 12/07/11: prox to mid RCA 75%, EF 55-65%; FFR of RCA lesion negative. Bradycardia was noted and this limited her Rx. She underwent dual chamber AICD (St. Jude) implant by Dr. Hillis Range 12/07/11.  Her e-lytes need to remain stable (K > 4, Mg > 2). Medical Rx was recommended for tx of her CAD. She is not on an ASA b/c of her prior hx of cerebral aneurysm.   She was last seen by Dr. Johney Frame 6/13. She was doing well at that time.  She continues to do well.  The patient denies chest pain, shortness of breath, syncope, orthopnea, PND or significant pedal edema.   Wt Readings from Last 3 Encounters:  06/30/12 171 lb 6.4 oz (77.747 kg)  04/29/12 174 lb (78.926 kg)  03/30/12 171 lb 1.9 oz (77.62 kg)     Past Medical History  Diagnosis Date  . ARTHRITIS, HAND   . ARTHRITIS, LEFT SHOULDER   . HEARING LOSS   . SMOKER   . CEREBROVASCULAR ACCIDENT, HX OF   . HYPERTENSION   . HYPERLIPIDEMIA   . HYPOTHYROIDISM   . OVERACTIVE BLADDER   . Cerebral aneurysm     s/p clipping  . CAD (coronary artery disease)     LHC 12/2011: RCA 75% (neg FFR), EF 55-65% - med Tx  . Polymorphic ventricular tachycardia     s/p St Jude AICD (dual chamber) 12/2011;  Echo: mild LVH, EF 50-555, grade 1 diast dysfxn, trivial AI.   Marland Kitchen Torsade de pointes     PVC dependent  . Syncope      2/2 VTach  . Bradycardia     A paced by AICD    Current Outpatient Prescriptions  Medication Sig Dispense Refill  . amLODipine (NORVASC) 10 MG tablet Take 1 tablet (10 mg total) by mouth daily.  30 tablet  6  . carbidopa-levodopa (SINEMET) 25-100 MG per tablet increase to 2 tablet three times a day as directed  180 tablet  2  . levothyroxine (SYNTHROID, LEVOTHROID) 150 MCG tablet Take 1 tablet (150 mcg total) by mouth daily.  30 tablet  3  . NON FORMULARY Quinacrine 100mg  Daily      . olmesartan (BENICAR) 40 MG tablet Take 1 tablet (40 mg total) by mouth daily.  30 tablet  11  . potassium chloride (K-DUR,KLOR-CON) 10 MEQ tablet TAKE ONLY ON MON, WED, FRI'S  30 tablet  11  . propranolol ER (INDERAL LA) 160 MG SR capsule TAKE 1 CAPSULE (160 MG TOTAL) BY MOUTH DAILY.  30 capsule  6  . simvastatin (ZOCOR) 20 MG tablet 1 BY MOUTH AT BEDTIME  30 tablet  5  . DISCONTD: simvastatin (ZOCOR) 20 MG tablet Take 20 mg by mouth every evening.        Allergies: Allergies  Allergen Reactions  .  Diphenhydramine Hcl   . Penicillins     History  Substance Use Topics  . Smoking status: Former Smoker -- 0.6 packs/day    Quit date: 11/28/2011  . Smokeless tobacco: Never Used   Comment: Widowed, lives with dtr. retired from [employer] and [employer]  . Alcohol Use: No     PHYSICAL EXAM: VS:  BP 120/64  Pulse 76  Ht 5\' 5"  (1.651 m)  Wt 171 lb 6.4 oz (77.747 kg)  BMI 28.52 kg/m2 Well nourished, well developed, in no acute distress HEENT: normal Neck: no JVD Vascular: no carotid bruits Cardiac:  normal S1, S2; RRR; no murmur Lungs:  clear to auscultation bilaterally, no wheezing, rhonchi or rales Abd: soft, nontender, no hepatomegaly Ext: no edema Skin: warm and dry Neuro:  CNs 2-12 intact, no focal abnormalities noted  EKG:  Atrial paced, heart rate 76, inferolateral T wave inversions, PVC, no change from prior tracing      ASSESSMENT AND PLAN:  1. Polymorphic Ventricular  Tachycardia: No apparent recurrence. She will continue followup with the EP clinic as directed. Check a basic metabolic panel and magnesium level today. Followup with Dr. Johney Frame as planned.  2. Hypertension:  Controlled.  Continue current therapy.   3. Hyperlipidemia: Managed by primary care.  4. Coronary Artery Disease:  No symptoms of angina. Continue statin therapy. She is unable take aspirin due to history of prior cerebral aneurysm clipping. I have asked her to discuss this with her neurologist to see if aspirin therapy would be acceptable in the future. I have completed her handicapped placard again today.  Signed, Tereso Newcomer, PA-C  10:20 AM 06/30/2012

## 2012-06-30 NOTE — Patient Instructions (Addendum)
Please ask your neurologist at your next appointment if you can ever take an Aspirin.   Your physician recommends that you return for lab work in: TODAY BMET, MAGNESIUM  WE WILL SEND OUT A REMINDER LETTER FOR YOUR APPOINTMENT WITH DR. ALLRED IN MARCH   YOU HAVE BEEN GIVE A HANDICAPP FORM TODAY

## 2012-06-30 NOTE — Telephone Encounter (Signed)
Message copied by Tarri Fuller on Thu Jun 30, 2012  4:30 PM ------      Message from: [place], [place] T      Created: Thu Jun 30, 2012  1:58 PM       K+ and Mg2+ ok.      Renal fxn fairly stable.      Tereso Newcomer, PA-C  1:57 PM 06/30/2012

## 2012-06-30 NOTE — Telephone Encounter (Signed)
pt notified about lab results today w/verbal understanding  

## 2012-07-04 ENCOUNTER — Encounter: Payer: Self-pay | Admitting: *Deleted

## 2012-07-04 ENCOUNTER — Encounter: Payer: Medicare Other | Admitting: *Deleted

## 2012-07-13 ENCOUNTER — Telehealth: Payer: Self-pay | Admitting: Neurology

## 2012-07-13 NOTE — Telephone Encounter (Signed)
Pt was advised by Dr. Modesto Charon that if her Sinemet didn't work they should call us. Pt's daughter isn't sure if it's safe for Merci to discontinue pills or if she must taper down. She also doesn't know if they need an appointment. Dr. Modesto Charon advised that Dr. Arbutus Leas would be taking over her case. Please call back at home #.

## 2012-07-14 NOTE — Telephone Encounter (Signed)
Called and spoke with the patient's daughter, Cherly Hensen. She reports that the Sinemet doesn't seem to be helping her tremor. Saw Dr. Modesto Charon in July and he recommended she f/u with Dr. Arbutus Leas. Appointment made for her on 07/22/12 at 10:45; to arrive at 10:15. Aware of our new location. Will stay on the Sinemet until she sees Dr. Arbutus Leas next week. No other concerns voiced at this time.

## 2012-07-22 ENCOUNTER — Ambulatory Visit (INDEPENDENT_AMBULATORY_CARE_PROVIDER_SITE_OTHER): Payer: Medicare Other | Admitting: Neurology

## 2012-07-22 ENCOUNTER — Encounter: Payer: Self-pay | Admitting: Neurology

## 2012-07-22 ENCOUNTER — Ambulatory Visit: Payer: Medicare Other

## 2012-07-22 VITALS — BP 126/76 | HR 84 | Temp 97.4°F | Resp 20 | Wt 176.0 lb

## 2012-07-22 DIAGNOSIS — R259 Unspecified abnormal involuntary movements: Secondary | ICD-10-CM

## 2012-07-22 DIAGNOSIS — G309 Alzheimer's disease, unspecified: Secondary | ICD-10-CM

## 2012-07-22 DIAGNOSIS — F0280 Dementia in other diseases classified elsewhere without behavioral disturbance: Secondary | ICD-10-CM

## 2012-07-22 LAB — FOLATE: Folate: 11.4 ng/mL (ref >5.9)

## 2012-07-22 LAB — VITAMIN B12: Vitamin B-12: 288 pg/mL (ref 211–911)

## 2012-07-22 NOTE — Progress Notes (Signed)
Erika Rivera was seen today in the movement disorders clinic for neurologic consultation at the request of Dr. Meryle Ready and Rene Paci, MD.  Since Dr. Modesto Charon is no longer here, I will be resuming the patients care.  I reviewed Dr. Maurice March notes.  This patient is accompanied in the office by her daughter, Cherly Hensen.  The history is supplemented by present family.  The tremor has been has been present for 3 years and it is most present when she tries to hold things.  She will spill food and uses L hand for things.  She drinks 2 small cups of caffeinated coffee per day.  She drinks non caffeinated soda.     The consultation is for the evaluation of tremor.  Dr Modesto Charon was not sure if this was ET or a parkinsonian tremor.  Last visit, her sinemet 25/100 was increased to 2 po tid in addition to the propranolol that she was on previously.  They do not believe it helped.  She is also on propranolol 160 mg daily.  There is no fam hx of tremor.   Specific Symptoms:  Tremor: as above Voice: a little more quiet Sleep: trouble sleeping because worked at nights for years and has always had trouble sleeping during night  Vivid Dreams:  no  Acting out dreams:  no Wet Pillows: no Postural symptoms:  Some off balance but daughter noticed worse because not exercising  Falls?  No, none since placement of ICD Loss of smell:  no Loss of taste:  no Urinary Incontinence:  Bladder control problem for years but only needs pad Difficulty Swallowing:  ok Handwriting, micrographia: shaky, and some smaller than in the past Trouble with ADL's:  No, except cannot get into bath d/t balance  Trouble buttoning clothing:  no Depression:  Yes, and increased agitation c/t past.  No suicidal ideation Memory changes:  Some, daughter has paid bills since June 2013 because pt would be giving away money  Hallucinations:  no  visual distortions: no N/V:  no Lightheaded:  no  Syncope: not since placement of ICD in  11/2011 Diplopia:  no  PREVIOUS MEDICATIONS: Sinemet  ALLERGIES:   Allergies  Allergen Reactions  . Diphenhydramine Hcl   . Penicillins     CURRENT MEDICATIONS:  Current Outpatient Prescriptions on File Prior to Visit  Medication Sig Dispense Refill  . amLODipine (NORVASC) 10 MG tablet Take 1 tablet (10 mg total) by mouth daily.  30 tablet  6  . carbidopa-levodopa (SINEMET) 25-100 MG per tablet increase to 2 tablet three times a day as directed  180 tablet  2  . levothyroxine (SYNTHROID, LEVOTHROID) 150 MCG tablet Take 1 tablet (150 mcg total) by mouth daily.  30 tablet  3  . NON FORMULARY Quinacrine 100mg  Daily      . olmesartan (BENICAR) 40 MG tablet Take 1 tablet (40 mg total) by mouth daily.  30 tablet  11  . potassium chloride (K-DUR,KLOR-CON) 10 MEQ tablet TAKE ONLY ON MON, WED, FRI'S  30 tablet  11  . propranolol ER (INDERAL LA) 160 MG SR capsule TAKE 1 CAPSULE (160 MG TOTAL) BY MOUTH DAILY.  30 capsule  6  . simvastatin (ZOCOR) 20 MG tablet 1 BY MOUTH AT BEDTIME  30 tablet  5    PAST MEDICAL HISTORY:   Past Medical History  Diagnosis Date  . ARTHRITIS, HAND   . ARTHRITIS, LEFT SHOULDER   . HEARING LOSS   . SMOKER   . CEREBROVASCULAR  ACCIDENT, HX OF   . HYPERTENSION   . HYPERLIPIDEMIA   . HYPOTHYROIDISM   . OVERACTIVE BLADDER   . Cerebral aneurysm     s/p clipping  . CAD (coronary artery disease)     LHC 12/2011: RCA 75% (neg FFR), EF 55-65% - med Tx  . Polymorphic ventricular tachycardia     s/p St Jude AICD (dual chamber) 12/2011;  Echo: mild LVH, EF 50-555, grade 1 diast dysfxn, trivial AI.   Marland Kitchen Torsade de pointes     PVC dependent  . Syncope     2/2 VTach  . Bradycardia     A paced by AICD    PAST SURGICAL HISTORY:   Past Surgical History  Procedure Date  . Cardiac defibrillator placement 12/07/11    SJM ICD implanted for recurrent Torsades with syncope    SOCIAL HISTORY:   History   Social History  . Marital Status: Widowed    Spouse Name: N/A     Number of Children: N/A  . Years of Education: N/A   Occupational History  . Not on file.   Social History Main Topics  . Smoking status: Former Smoker -- 0.6 packs/day    Quit date: 11/28/2011  . Smokeless tobacco: Never Used   Comment: Widowed, lives with dtr. retired from [employer] and [employer] ([employer])  . Alcohol Use: No  . Drug Use: No  . Sexually Active: Not on file   Other Topics Concern  . Not on file   Social History Narrative  . No narrative on file    FAMILY HISTORY:   No family status information on file.    ROS:  A complete 10 system review of systems was obtained and was unremarkable apart from what is mentioned above.  PHYSICAL EXAMINATION:    VITALS:   Filed Vitals:   07/22/12 1028  BP: 126/76  Pulse: 84  Temp: 97.4 F (36.3 C)  Resp: 20  Weight: 176 lb (79.833 kg)    GEN:  The patient appears stated age and is in NAD. HEENT:  Normocephalic, atraumatic.  The mucous membranes are moist. The superficial temporal arteries are without ropiness or tenderness. CV:  RRR Lungs:  CTAB Neck/HEME:  There are no carotid bruits bilaterally.  Neurological examination:  Orientation: A MoCA was done today and the patient scored a 14/30. Cranial nerves: There is good facial symmetry. Pupils are equal round and reactive to light bilaterally.  Fundoscopic exam is attempted but the disc margins are not well visualized bilaterally. Extraocular muscles are intact. The visual fields are full to confrontational testing. The speech is fluent and clear. Soft palate rises symmetrically and there is no tongue deviation. Hearing is decreased to conversational tone. Sensation: Sensation is intact to light and pinprick throughout (facial, trunk, extremities). Vibration is intact at the bilateral ankle. There is no extinction with double simultaneous stimulation. There is no sensory dermatomal level identified. Motor: Strength is 5/5 in the bilateral upper and lower extremities.    Shoulder shrug is equal and symmetric.  There is no pronator drift. Deep tendon reflexes: Deep tendon reflexes are 1/4 at the bilateral biceps, triceps, brachioradialis, and absent at the bilateral patella and achilles. Plantar responses are downgoing bilaterally.  Movement examination: Tone: There is normal tone in the bilateral upper extremities.  The tone in the lower extremities is normal.  Abnormal movements: There is minimal tremor of the R outstretched hand.  There is no asterixis.  The tremor is most predominant when  the patient looks at her outstretched hand.  She has minimal trouble with writing due to tremulousness. Coordination:  There is no decremation with RAM's in the hands or feet, tested with alternating supination/pronation, finger taps, hand opening and closing and heel and toe taps. Gait and Station: The patient has minimal difficulty arising out of a deep-seated chair without the use of the hands. The patient's stride length is normal and she has good arm swing.  There is mild postural instability.     Chemistry      Component Value Date/Time   NA 139 06/30/2012 1050   K 4.1 06/30/2012 1050   CL 106 06/30/2012 1050   CO2 26 06/30/2012 1050   BUN 22 06/30/2012 1050   CREATININE 1.7* 06/30/2012 1050      Component Value Date/Time   CALCIUM 9.7 06/30/2012 1050   ALKPHOS 57 04/23/2011 0958   AST 25 04/23/2011 0958   ALT 10 04/23/2011 0958   BILITOT 0.4 04/23/2011 0958     No results found for this basename: VITAMINB12   No results found for this basename: RPR   Lab Results  Component Value Date   WBC 5.8 12/07/2011   HGB 11.8* 12/07/2011   HCT 36.9 12/07/2011   MCV 95.3 12/07/2011   PLT 247 12/07/2011   Lab Results  Component Value Date   TSH 0.03* 03/30/2012      ASSESSMENT:   1.  Tremor.  I see no evidence of idiopathic PD or any of the parkinsonian syndromes.  Tremor may be due to prior brain injury from aneurysm and resulting encephalomalacia.   2.  Memory loss.  I  suspect that this represents a dementia of the Alzheimers type but it could be vascular in nature.  PLAN:   1.  We discussed the diagnosis as well as pathophysiology of the disease.  We discussed treatment options as well as prognostic indicators.  Patient education was provided. 2.  Safety in the home was discussed.  Much greater than 50% of this visit was spent in counseling with the patient and the family.   Time in room with the patient was 60 min. 3.  I will slowly wean her off of the sinemet as it has not been beneficial. 4.  I will see her back in 4 weeks after she is off of the sinemet and likely talk about starting primidone. 5.  In the future, I would like to see her on an acetylcholinesterase inhibitor and perhaps an NMDA receptor antagonist for memory in the future.  I told them that we would likely do this in the future after we have changed tremor meds. 6.  We will do B12, RPR, folate and vit D today. 7.  Return in about 4 weeks (around 08/19/2012) for follow up tremor.Marland Kitchen

## 2012-07-22 NOTE — Patient Instructions (Addendum)
Directions on how to slowly stop the Sinemet (carbidopa-levodopa):  Take one tablet three times a day for one week. Take one tablet two times a day for one week. Take one tablet one time a day for one week then stop.  Start some safe exercises!!!  We will see you back in four weeks.

## 2012-07-23 LAB — VITAMIN D 25 HYDROXY (VIT D DEFICIENCY, FRACTURES): Vit D, 25-Hydroxy: 13 ng/mL — ABNORMAL LOW (ref 30–89)

## 2012-08-18 ENCOUNTER — Other Ambulatory Visit: Payer: Self-pay | Admitting: Internal Medicine

## 2012-08-19 ENCOUNTER — Encounter: Payer: Self-pay | Admitting: Neurology

## 2012-08-19 ENCOUNTER — Ambulatory Visit (INDEPENDENT_AMBULATORY_CARE_PROVIDER_SITE_OTHER): Payer: Medicare Other | Admitting: Neurology

## 2012-08-19 ENCOUNTER — Other Ambulatory Visit: Payer: Self-pay | Admitting: Neurology

## 2012-08-19 VITALS — BP 110/60 | HR 80 | Temp 97.4°F | Resp 16 | Ht 64.0 in | Wt 172.0 lb

## 2012-08-19 DIAGNOSIS — F0280 Dementia in other diseases classified elsewhere without behavioral disturbance: Secondary | ICD-10-CM

## 2012-08-19 DIAGNOSIS — G309 Alzheimer's disease, unspecified: Secondary | ICD-10-CM

## 2012-08-19 DIAGNOSIS — R259 Unspecified abnormal involuntary movements: Secondary | ICD-10-CM

## 2012-08-19 MED ORDER — PRIMIDONE 50 MG PO TABS: 50.0000 mg | ORAL_TABLET | Freq: Every day | ORAL | Status: DC

## 2012-08-19 NOTE — Patient Instructions (Signed)
1.  Start primidone - 1/2 tablet at night for 3-4 nights and then increase to 1 tablet nightly 2.  B12 injections:   We will start that today. 3.  Follow up in January

## 2012-08-19 NOTE — Progress Notes (Signed)
Erika Rivera was seen today in the movement disorders clinic for neurologic f/u for tremor and memory loss.   Last visit, I weaned her off the L-dopa because I did not think that she was parkinsonian.  She has noticed no difference off of the medication compared to when she was on the medication.  She continues to have tremor that is most bothersome when she goes to pour something or when she eats.  She remains on propranolol 160 mg daily.  There is no fam hx of tremor.  Her daughter does state today that her cardiologist wanted to put her on aspirin, but wanted to make sure that was okay given history of aneurysm over 20 years ago.   Specific Symptoms:  Tremor: as above Voice: a little more quiet Sleep: trouble sleeping because worked at nights for years and has always had trouble sleeping during night  Vivid Dreams:  no  Acting out dreams:  no Wet Pillows: no Postural symptoms:  Some off balance but daughter noticed worse because not exercising  Falls?  No, none since placement of ICD Loss of smell:  no Loss of taste:  no Urinary Incontinence:  Bladder control problem for years but only needs pad Difficulty Swallowing:  ok Handwriting, micrographia: shaky, and some smaller than in the past Trouble with ADL's:  No, except cannot get into bath d/t balance  Trouble buttoning clothing:  no Depression:  Yes, and increased agitation c/t past.  No suicidal ideation Memory changes:  Some, daughter has paid bills since June 2013 because pt would be giving away money  Hallucinations:  no  visual distortions: no N/V:  no Lightheaded:  no  Syncope: not since placement of ICD in 11/2011 Diplopia:  no  PREVIOUS MEDICATIONS: Sinemet  ALLERGIES:   Allergies  Allergen Reactions  . Diphenhydramine Hcl   . Penicillins     CURRENT MEDICATIONS:  Current Outpatient Prescriptions on File Prior to Visit  Medication Sig Dispense Refill  . amLODipine (NORVASC) 10 MG tablet Take 1 tablet (10 mg  total) by mouth daily.  30 tablet  6  . levothyroxine (SYNTHROID, LEVOTHROID) 150 MCG tablet TAKE 1 TABLET (150 MCG TOTAL) BY MOUTH DAILY.  30 tablet  5  . NON FORMULARY Quinacrine 100mg  Daily      . olmesartan (BENICAR) 40 MG tablet Take 1 tablet (40 mg total) by mouth daily.  30 tablet  11  . potassium chloride (K-DUR,KLOR-CON) 10 MEQ tablet TAKE ONLY ON MON, WED, FRI'S  30 tablet  11  . propranolol ER (INDERAL LA) 160 MG SR capsule TAKE 1 CAPSULE (160 MG TOTAL) BY MOUTH DAILY.  30 capsule  6  . simvastatin (ZOCOR) 20 MG tablet 1 BY MOUTH AT BEDTIME  30 tablet  5  . primidone (MYSOLINE) 50 MG tablet Take 1 tablet (50 mg total) by mouth at bedtime.  30 tablet  4    PAST MEDICAL HISTORY:   Past Medical History  Diagnosis Date  . ARTHRITIS, HAND   . ARTHRITIS, LEFT SHOULDER   . HEARING LOSS   . SMOKER   . CEREBROVASCULAR ACCIDENT, HX OF   . HYPERTENSION   . HYPERLIPIDEMIA   . HYPOTHYROIDISM   . OVERACTIVE BLADDER   . Cerebral aneurysm     s/p clipping  . CAD (coronary artery disease)     LHC 12/2011: RCA 75% (neg FFR), EF 55-65% - med Tx  . Polymorphic ventricular tachycardia     s/p St Jude AICD (dual  chamber) 12/2011;  Echo: mild LVH, EF 50-555, grade 1 diast dysfxn, trivial AI.   Marland Kitchen Torsade de pointes     PVC dependent  . Syncope     2/2 VTach  . Bradycardia     A paced by AICD    PAST SURGICAL HISTORY:   Past Surgical History  Procedure Date  . Cardiac defibrillator placement 12/07/11    SJM ICD implanted for recurrent Torsades with syncope  . Aneursym clipping     cerebral    SOCIAL HISTORY:   History   Social History  . Marital Status: Widowed    Spouse Name: N/A    Number of Children: N/A  . Years of Education: N/A   Occupational History  . Not on file.   Social History Main Topics  . Smoking status: Former Smoker -- 0.6 packs/day    Quit date: 11/28/2011  . Smokeless tobacco: Never Used     Comment: Widowed, lives with dtr. retired from [employer] and  [employer] (new[employer])  . Alcohol Use: No  . Drug Use: No  . Sexually Active: Not on file   Other Topics Concern  . Not on file   Social History Narrative  . No narrative on file    FAMILY HISTORY:   Family Status  Relation Status Death Age  . Mother Deceased     unknown cause of death  . Father Deceased     renal failure  . Sister Alive     unknown medical health  . Brother Alive     healthy  . Brother Deceased     2, one at birth w/ hydrocephalous  . Child Deceased     CA  . Child Alive     alive and well    ROS:  A complete 10 system review of systems was obtained and was unremarkable apart from what is mentioned above.  PHYSICAL EXAMINATION:    VITALS:   Filed Vitals:   08/19/12 1515  BP: 110/60  Pulse: 80  Temp: 97.4 F (36.3 C)  Resp: 16  Height: 5\' 4"  (1.626 m)  Weight: 172 lb (78.019 kg)    GEN:  The patient appears stated age and is in NAD. HEENT:  Normocephalic, atraumatic.  The mucous membranes are moist. The superficial temporal arteries are without ropiness or tenderness. CV:  RRR Lungs:  CTAB Neck/HEME:  There are no carotid bruits bilaterally.  Neurological examination:  Orientation: A MoCA was done today and the patient scored a 15/30.  This was done a month ago and the patient scored about the same (14/30). Cranial nerves: There is good facial symmetry. Pupils are equal round and reactive to light bilaterally.  Fundoscopic exam is attempted but the disc margins are not well visualized bilaterally. Extraocular muscles are intact. The visual fields are full to confrontational testing. The speech is fluent and clear. Soft palate rises symmetrically and there is no tongue deviation. Hearing is decreased to conversational tone. Sensation: Sensation is intact to light and pinprick throughout (facial, trunk, extremities). Vibration is intact at the bilateral ankle. There is no extinction with double simultaneous stimulation. There is no sensory  dermatomal level identified. Motor: Strength is 5/5 in the bilateral upper and lower extremities.   Shoulder shrug is equal and symmetric.  There is no pronator drift. Deep tendon reflexes: Deep tendon reflexes are 1/4 at the bilateral biceps, triceps, brachioradialis, and absent at the bilateral patella and achilles. Plantar responses are downgoing bilaterally.  Movement examination:  Tone: There is normal tone in the bilateral upper extremities.  The tone in the lower extremities is normal.  Abnormal movements: There is minimal tremor of the R outstretched hand.  There is no asterixis.  The tremor is most predominant when the patient looks at her outstretched hand.  She has minimal trouble with writing due to tremulousness. Coordination:  There is no decremation with RAM's in the hands or feet, tested with alternating supination/pronation, finger taps, hand opening and closing and heel and toe taps. Gait and Station: The patient has minimal difficulty arising out of a deep-seated chair without the use of the hands. The patient's stride length is normal and she has good arm swing.  There is mild postural instability.   LABS:   Lab Results  Component Value Date   VITAMINB12 288 07/22/2012      ASSESSMENT:   1.  Tremor.  I see no evidence of idiopathic PD or any of the parkinsonian syndromes.  Tremor may be due to prior brain injury from aneurysm and resulting encephalomalacia.  She is off of carbidopa/levodopa without changes from previous. 2.  Memory loss.  I suspect that this represents a dementia of the Alzheimers type but it could be vascular in nature. 3.  Mild B12 deficiency. 4.  Hx of cerebral aneurysm greater than 20 years ago.  PLAN:   1.  We discussed the diagnosis as well as pathophysiology of the disease.  We discussed treatment options as well as prognostic indicators.  Patient education was provided. 2.  We are going to initiate primidone, 50 mg nightly.  She may need a  titration upward, but I want to take this very slowly.  In fact, I asked her daughter to cut the pill in half for the first 3 or 4 nights.   Risks, benefits, side effects and alternative therapies were discussed.  The opportunity to ask questions was given and they were answered to the best of my ability.  The patient expressed understanding and willingness to follow the outlined treatment protocols. 3. Her first B12 injection was given in the office today.  She will get one B12 injection weekly for the next month and then we will go to monthly. 4.  In the future, I would like to see her on an acetylcholinesterase inhibitor and perhaps an NMDA receptor antagonist for memory in the future.  I told them that we would likely do this in the future after we have changed tremor meds. 5. As above, the patient asked me about starting her on aspirin as her cardiologist wanted her to do that.  I think that the benefits of the aspirin now outweigh the risks, but I talked to her extensively about her risk because of previous aneurysm and also given her risk for falls.  She would like to proceed with the 81 mg aspirin as with her daughter.  I have no objection to that. 6.  Return in about 2 months (around 10/19/2012).

## 2012-08-22 MED ORDER — CYANOCOBALAMIN 1000 MCG/ML IJ SOLN
1000.0000 ug | Freq: Once | INTRAMUSCULAR | Status: AC
  Administered 2012-08-22: 1000 ug via INTRAMUSCULAR

## 2012-08-22 NOTE — Addendum Note (Signed)
Addended by: Benay Spice on: 08/22/2012 09:28 AM   Modules accepted: Orders

## 2012-08-24 ENCOUNTER — Other Ambulatory Visit (INDEPENDENT_AMBULATORY_CARE_PROVIDER_SITE_OTHER): Payer: Medicare Other

## 2012-08-24 DIAGNOSIS — E538 Deficiency of other specified B group vitamins: Secondary | ICD-10-CM

## 2012-08-24 MED ORDER — CYANOCOBALAMIN 1000 MCG/ML IJ SOLN
1000.0000 ug | Freq: Once | INTRAMUSCULAR | Status: AC
  Administered 2012-08-24: 1000 ug via INTRAMUSCULAR

## 2012-08-31 ENCOUNTER — Other Ambulatory Visit (INDEPENDENT_AMBULATORY_CARE_PROVIDER_SITE_OTHER): Payer: Medicare Other

## 2012-08-31 DIAGNOSIS — E538 Deficiency of other specified B group vitamins: Secondary | ICD-10-CM

## 2012-08-31 MED ORDER — CYANOCOBALAMIN 1000 MCG/ML IJ SOLN
1000.0000 ug | Freq: Once | INTRAMUSCULAR | Status: AC
  Administered 2012-08-31: 1000 ug via INTRAMUSCULAR

## 2012-09-07 ENCOUNTER — Other Ambulatory Visit (INDEPENDENT_AMBULATORY_CARE_PROVIDER_SITE_OTHER): Payer: Medicare Other

## 2012-09-07 DIAGNOSIS — E538 Deficiency of other specified B group vitamins: Secondary | ICD-10-CM

## 2012-09-07 MED ORDER — CYANOCOBALAMIN 1000 MCG/ML IJ SOLN
1000.0000 ug | Freq: Once | INTRAMUSCULAR | Status: AC
  Administered 2012-09-07: 1000 ug via INTRAMUSCULAR

## 2012-09-22 ENCOUNTER — Ambulatory Visit (INDEPENDENT_AMBULATORY_CARE_PROVIDER_SITE_OTHER): Payer: Medicare Other | Admitting: Internal Medicine

## 2012-09-22 ENCOUNTER — Encounter: Payer: Self-pay | Admitting: Internal Medicine

## 2012-09-22 ENCOUNTER — Other Ambulatory Visit (INDEPENDENT_AMBULATORY_CARE_PROVIDER_SITE_OTHER): Payer: Medicare Other

## 2012-09-22 VITALS — BP 120/70 | HR 68 | Temp 97.4°F | Ht 65.0 in | Wt 171.4 lb

## 2012-09-22 DIAGNOSIS — E039 Hypothyroidism, unspecified: Secondary | ICD-10-CM

## 2012-09-22 DIAGNOSIS — E785 Hyperlipidemia, unspecified: Secondary | ICD-10-CM

## 2012-09-22 DIAGNOSIS — M810 Age-related osteoporosis without current pathological fracture: Secondary | ICD-10-CM

## 2012-09-22 DIAGNOSIS — Z1211 Encounter for screening for malignant neoplasm of colon: Secondary | ICD-10-CM

## 2012-09-22 DIAGNOSIS — R81 Glycosuria: Secondary | ICD-10-CM

## 2012-09-22 LAB — BASIC METABOLIC PANEL
BUN: 24 mg/dL — ABNORMAL HIGH (ref 6–23)
CO2: 22 mEq/L (ref 19–32)
Calcium: 10.1 mg/dL (ref 8.4–10.5)
GFR: 41.08 mL/min — ABNORMAL LOW (ref >60.00)
Glucose, Bld: 79 mg/dL (ref 70–99)
Sodium: 138 mEq/L (ref 135–145)

## 2012-09-22 LAB — LIPID PANEL
Cholesterol: 120 mg/dL (ref 0–200)
LDL Cholesterol: 65 mg/dL (ref 0–99)
Triglycerides: 71 mg/dL (ref 0.0–149.0)
VLDL: 14.2 mg/dL (ref 0.0–40.0)

## 2012-09-22 MED ORDER — VITAMIN D3 125 MCG (5000 UT) PO TABS: 5000.0000 [IU] | ORAL_TABLET | Freq: Every day | ORAL | Status: DC

## 2012-09-22 NOTE — Assessment & Plan Note (Signed)
hx noncompliance with meds summer 2012 Started working with endo for same fall 2012 - reports now taking meds and stabilized 12/2011 Check today - adjust if needed  Lab Results  Component Value Date   TSH 0.03* 03/30/2012

## 2012-09-22 NOTE — Assessment & Plan Note (Signed)
Hx CVA - but Noncompliance with statin summer 2012 Improved last lab check 07/2011 - Check annually The current medical regimen is effective;  continue present plan and medications.

## 2012-09-22 NOTE — Patient Instructions (Signed)
It was good to see you today. We have reviewed your prior records including labs and tests today Medications reviewed, no changes at this time. Test(s) ordered today. Your results will be released to MyChart (or called to you) after review, usually within 72hours after test completion. If any changes need to be made, you will be notified at that same time. we'll make referral for colonoscopy and for bone density scanning. Our office will contact you regarding appointment(s) once made. Please scheduled followup in 6 months, call sooner if problems. Colonoscopy A colonoscopy is an exam to look at your colon. This exam helps to find lumps (tumors), growths (polyps), puffiness (swelling), and bleeding in your colon.   BEFORE THE PROCEDURE  You may need to drink clear liquids for 2 days before the exam.   Ask your doctor about changing or stopping your regular medicines.   You may need liquid or medicines put in your butt (enema or laxatives) to help you poop.   You may need to drink a liquid over a short amount of time. This liquid cleans your colon.   Ask your doctor what time you need to arrive.  PROCEDURE A tube is put in the opening of your butt (anus) and into your colon. The doctor will look for anything that is not normal. Your doctor may take a tissue sample (biopsy) from your colon to be looked at more closely. AFTER THE PROCEDURE  Have someone drive you home if you took pain medicine or a medicine to relax you (sedative).   You may see blood in your poop (stool). This is normal.   You may pass gas and have belly (abdominal) cramps. This is normal.  Finding out the results of your test Ask when your test results will be ready. Make sure you get your test results. Document Released: 10/24/2010 Document Revised: 12/14/2011 Document Reviewed: 10/24/2010 [facility] Patient Information 2013 [place], [place].

## 2012-09-22 NOTE — Progress Notes (Signed)
Subjective:    Patient ID: Erika Rivera, female    DOB: 11-27-1935, 76 y.o.   MRN: [id number]  HPI  here for followup - reviewed chronic medical issues  Hx syncope events: Seen in ER 10/02/11 for syncope, standing on porch and "went out", hit head on back of wall -  no seizure or chest pain, momentary loss of consciousness  - dx UTI on Er review 2nd syncope event 10/20/11 at home upon getting out of bed and going to bathroom - "head got fuzzy and i saw myself going down" - dtr witnessed both events - no incontinence or confusion 2 more events 11/2011 prompting return to ER> dx brady and PMVT S/p LHC, EP, echo> ICD placed 12/2011 - follows with EP for same  dyslipidemia - on statin since fall 2010 -the patient reports compliance with medication(s) as prescribed. Denies adverse side effects.  hypothyroid - no longer working with endo on same - variable compliance -denies adverse side effects related to current therapy.   arthiritis - reports compliance with ongoing medical treatment and no changes in medication dose or frequency.  denies adverse side effects related to current therapy.   hypertension - reports compliance with ongoing medical treatment and no changes in medication dose or frequency. denies adverse side effects related to current therapy. no chest pain, edema or shortness of breath    Essential tremor - working with neuro - started primidone 09/2012-the patient reports compliance with medication(s) as prescribed. Denies adverse side effects.   Past Medical History  Diagnosis Date  . ARTHRITIS, HAND   . ARTHRITIS, LEFT SHOULDER   . HEARING LOSS   . SMOKER   . CEREBROVASCULAR ACCIDENT, HX OF   . HYPERTENSION   . HYPERLIPIDEMIA   . HYPOTHYROIDISM   . OVERACTIVE BLADDER   . Cerebral aneurysm     s/p clipping  . CAD (coronary artery disease)     LHC 12/2011: RCA 75% (neg FFR), EF 55-65% - med Tx  . Polymorphic ventricular tachycardia     s/p St Jude AICD (dual  chamber) 12/2011;  Echo: mild LVH, EF 50-555, grade 1 diast dysfxn, trivial AI.   Marland Kitchen Torsade de pointes     PVC dependent  . Syncope     2/2 VTach  . Bradycardia     A paced by AICD  . Essential tremor     Review of Systems  Respiratory: Negative for shortness of breath and wheezing.   Cardiovascular: Negative for chest pain and palpitations.  Neurological: Negative for dizziness, facial asymmetry, speech difficulty, weakness, light-headedness and headaches.       Objective:   Physical Exam  BP 120/70  Pulse 68  Temp 97.4 F (36.3 C) (Oral)  Ht 5\' 5"  (1.651 m)  Wt 171 lb 6.4 oz (77.747 kg)  BMI 28.52 kg/m2  SpO2 94% Wt Readings from Last 3 Encounters:  09/22/12 171 lb 6.4 oz (77.747 kg)  08/19/12 172 lb (78.019 kg)  07/22/12 176 lb (79.833 kg)   Constitutional: She appears well-developed and well-nourished. No distress. dtr x2 at side - HOH without hearing aides today Neck: Normal range of motion. Neck supple. No JVD present. No thyromegaly present.  Cardiovascular: Normal rate, regular rhythm and normal heart sounds.  No murmur heard. No BLE edema. device in L anterior wall without swelling, bruising or erythema around pocket.  Pulmonary/Chest: Effort normal and breath sounds normal. No respiratory distress. She has no wheezes.  Neuro: AAOx3, CN2-12 symmetrically intact - speech  fluent, follows 3 step commands and MAE well - gait normal, unaided - min RUE intention tremor in hand Psychiatric: She has a normal mood and affect. Her behavior is normal. Judgment and thought content normal.    Lab Results  Component Value Date   WBC 5.8 12/07/2011   HGB 11.8* 12/07/2011   HCT 36.9 12/07/2011   PLT 247 12/07/2011   CHOL 115 12/08/2011   TRIG 83 12/08/2011   HDL 54 12/08/2011   LDLDIRECT 138.6 04/23/2011   ALT 10 04/23/2011   AST 25 04/23/2011   NA 139 06/30/2012   K 4.1 06/30/2012   CL 106 06/30/2012   CREATININE 1.7* 06/30/2012   BUN 22 06/30/2012   CO2 26 06/30/2012   TSH 0.03*  03/30/2012   INR 1.03 12/07/2011        Assessment & Plan:   See problem list. Medications and labs reviewed today.  Glucosuria - per dtr on report from [facility] RN home visit - check a1c and bmet today  PMP bone loss- check Vit D and DEXA

## 2012-10-07 ENCOUNTER — Other Ambulatory Visit: Payer: Medicare Other

## 2012-10-14 ENCOUNTER — Ambulatory Visit: Payer: Medicare Other

## 2012-10-14 ENCOUNTER — Ambulatory Visit (INDEPENDENT_AMBULATORY_CARE_PROVIDER_SITE_OTHER)
Admission: RE | Admit: 2012-10-14 | Discharge: 2012-10-14 | Disposition: A | Discharge status: Home / Self Care | Payer: Medicare Other | Source: Ambulatory Visit

## 2012-10-14 DIAGNOSIS — M810 Age-related osteoporosis without current pathological fracture: Secondary | ICD-10-CM

## 2012-10-27 ENCOUNTER — Other Ambulatory Visit: Payer: Self-pay | Admitting: *Deleted

## 2012-10-27 DIAGNOSIS — I10 Essential (primary) hypertension: Secondary | ICD-10-CM

## 2012-10-27 MED ORDER — AMLODIPINE BESYLATE 10 MG PO TABS: 10.0000 mg | ORAL_TABLET | Freq: Every day | ORAL | Status: DC

## 2012-11-15 ENCOUNTER — Ambulatory Visit: Payer: Medicare Other

## 2012-11-18 ENCOUNTER — Ambulatory Visit: Payer: Medicare Other

## 2012-11-18 ENCOUNTER — Encounter: Payer: Self-pay | Admitting: Internal Medicine

## 2012-11-18 DIAGNOSIS — M8580 Other specified disorders of bone density and structure, unspecified site: Secondary | ICD-10-CM | POA: Insufficient documentation

## 2012-11-18 HISTORY — DX: Other specified disorders of bone density and structure, unspecified site: M85.80

## 2012-11-23 ENCOUNTER — Encounter: Payer: Self-pay | Admitting: Internal Medicine

## 2012-11-23 ENCOUNTER — Other Ambulatory Visit: Payer: Self-pay | Admitting: Emergency Medicine

## 2012-11-23 MED ORDER — PROPRANOLOL HCL ER 160 MG PO CP24: 160.0000 mg | ORAL_CAPSULE | Freq: Every day | ORAL | Status: DC

## 2012-12-02 ENCOUNTER — Ambulatory Visit (INDEPENDENT_AMBULATORY_CARE_PROVIDER_SITE_OTHER): Payer: Medicare Other

## 2012-12-02 DIAGNOSIS — M949 Disorder of cartilage, unspecified: Secondary | ICD-10-CM

## 2012-12-02 DIAGNOSIS — M8580 Other specified disorders of bone density and structure, unspecified site: Secondary | ICD-10-CM

## 2012-12-02 MED ORDER — CYANOCOBALAMIN 1000 MCG/ML IJ SOLN
1000.0000 ug | Freq: Once | INTRAMUSCULAR | Status: AC
  Administered 2012-12-02: 1000 ug via INTRAMUSCULAR

## 2012-12-07 ENCOUNTER — Encounter: Payer: Self-pay | Admitting: Internal Medicine

## 2012-12-07 ENCOUNTER — Ambulatory Visit (INDEPENDENT_AMBULATORY_CARE_PROVIDER_SITE_OTHER): Payer: Medicare Other | Admitting: Internal Medicine

## 2012-12-07 VITALS — BP 135/65 | HR 69 | Ht 65.0 in | Wt 178.2 lb

## 2012-12-07 DIAGNOSIS — I2510 Atherosclerotic heart disease of native coronary artery without angina pectoris: Secondary | ICD-10-CM

## 2012-12-07 DIAGNOSIS — I10 Essential (primary) hypertension: Secondary | ICD-10-CM

## 2012-12-07 DIAGNOSIS — I472 Ventricular tachycardia: Secondary | ICD-10-CM

## 2012-12-07 LAB — ICD DEVICE OBSERVATION
AL AMPLITUDE: 1.7 mv
AL IMPEDENCE ICD: 387.5 Ohm
CHARGE TIME: 9 s
HV IMPEDENCE: 77 Ohm
RV LEAD IMPEDENCE ICD: 562.5 Ohm
TOT-0007: 4
TOT-0008: 0
TOT-0010: 5
TZON-0003SLOWVT: 350 ms
TZON-0004SLOWVT: 16
TZON-0005SLOWVT: 6
TZON-0010SLOWVT: 40 ms
VF: 0

## 2012-12-07 NOTE — Progress Notes (Signed)
PCP: Rene Paci, MD  Erika Rivera is a 77 y.o. female who presents today for routine electrophysiology followup.  Since her last visit, the patient reports doing very well.  Today, she denies symptoms of palpitations, chest pain, shortness of breath,  lower extremity edema, dizziness, presyncope, syncope, or ICD shocks.  The patient is otherwise without complaint today.   Past Medical History  Diagnosis Date  . ARTHRITIS, HAND   . ARTHRITIS, LEFT SHOULDER   . HEARING LOSS   . SMOKER   . CEREBROVASCULAR ACCIDENT, HX OF   . HYPERTENSION   . HYPERLIPIDEMIA   . HYPOTHYROIDISM   . OVERACTIVE BLADDER   . Cerebral aneurysm     s/p clipping  . CAD (coronary artery disease)     LHC 12/2011: RCA 75% (neg FFR), EF 55-65% - med Tx  . Polymorphic ventricular tachycardia     s/p St Jude AICD (dual chamber) 12/2011;  Echo: mild LVH, EF 50-555, grade 1 diast dysfxn, trivial AI.   Marland Kitchen Torsade de pointes     PVC dependent  . Syncope     2/2 VTach  . Bradycardia     A paced by AICD  . Essential tremor   . Osteopenia 11/18/2012    10/14/12 DEXA @ [facility]: -2.2   Past Surgical History  Procedure Laterality Date  . Cardiac defibrillator placement  12/07/11    SJM ICD implanted for recurrent Torsades with syncope  . Aneursym clipping      cerebral    Current Outpatient Prescriptions  Medication Sig Dispense Refill  . amLODipine (NORVASC) 10 MG tablet Take 1 tablet (10 mg total) by mouth daily.  30 tablet  6  . [START ON 12/12/2012] Calcium Carbonate-Vitamin D (CALCIUM + D PO) Take by mouth.      . Cholecalciferol (VITAMIN D3) 5000 UNITS TABS Take 5,000 Units by mouth daily.  30 tablet    . levothyroxine (SYNTHROID, LEVOTHROID) 150 MCG tablet TAKE 1 TABLET (150 MCG TOTAL) BY MOUTH DAILY.  30 tablet  5  . olmesartan (BENICAR) 40 MG tablet Take 1 tablet (40 mg total) by mouth daily.  30 tablet  11  . potassium chloride (K-DUR,KLOR-CON) 10 MEQ tablet TAKE ONLY ON MON, WED, FRI'S  30 tablet  11  .  primidone (MYSOLINE) 50 MG tablet Take 1 tablet (50 mg total) by mouth at bedtime.  30 tablet  4  . propranolol ER (INDERAL LA) 160 MG SR capsule Take 1 capsule (160 mg total) by mouth daily.  30 capsule  6  . simvastatin (ZOCOR) 20 MG tablet 1 BY MOUTH AT BEDTIME  30 tablet  5   No current facility-administered medications for this visit.    Physical Exam: Filed Vitals:   12/07/12 1131  BP: 135/65  Pulse: 69  Height: 5\' 5"  (1.651 m)  Weight: 178 lb 3.2 oz (80.831 kg)    GEN- The patient is elderly appearing, alert and oriented x 3 today.   Head- normocephalic, atraumatic Eyes-  Sclera clear, conjunctiva pink Ears- hearing intact Oropharynx- clear Lungs- Clear to ausculation bilaterally, normal work of breathing Chest- ICD pocket is well healed Heart- Regular rate and rhythm, no murmurs, rubs or gallops, PMI not laterally displaced GI- soft, NT, ND, + BS Extremities- no clubbing, cyanosis, or edema  ICD interrogation- reviewed in detail today,  See PACEART report  Assessment and Plan:

## 2012-12-07 NOTE — Assessment & Plan Note (Signed)
Stable No change required today  Will need a cell adapter for her [name] box.  Follow-up for cardiology care with [facility] in 6 months.  I will see in a year.

## 2012-12-07 NOTE — Assessment & Plan Note (Signed)
Stable No change required today  

## 2012-12-07 NOTE — Patient Instructions (Addendum)
Your physician wants you to follow-up in: 6 months with Glenice Laine and 12 months with Dr Jacquiline Doe will receive a reminder letter in the mail two months in advance. If you don't receive a letter, please call our office to schedule the follow-up appointment.  Remote monitoring is used to monitor your Pacemaker of ICD from home. This monitoring reduces the number of office visits required to check your device to one time per year. It allows [place] to keep an eye on the functioning of your device to ensure it is working properly. You are scheduled for a device check from home on 03/06/13. You may send your transmission at any time that day. If you have a wireless device, the transmission will be sent automatically. After your physician reviews your transmission, you will receive a postcard with your next transmission date.

## 2012-12-07 NOTE — Assessment & Plan Note (Signed)
Well controlled with atrial pacing at 70 bpm  Normal ICD function See Pace Art report No changes today

## 2012-12-22 ENCOUNTER — Encounter: Payer: Self-pay | Admitting: Internal Medicine

## 2012-12-22 ENCOUNTER — Ambulatory Visit (AMBULATORY_SURGERY_CENTER): Payer: Medicare Other | Admitting: *Deleted

## 2012-12-22 ENCOUNTER — Other Ambulatory Visit: Payer: Self-pay | Admitting: Physician Assistant

## 2012-12-22 VITALS — Ht 64.0 in | Wt 179.4 lb

## 2012-12-22 DIAGNOSIS — Z1211 Encounter for screening for malignant neoplasm of colon: Secondary | ICD-10-CM

## 2012-12-22 MED ORDER — PEG-KCL-NACL-NASULF-NA ASC-C 100 G PO SOLR: ORAL | Status: DC

## 2012-12-26 ENCOUNTER — Ambulatory Visit (INDEPENDENT_AMBULATORY_CARE_PROVIDER_SITE_OTHER): Payer: Medicare Other | Admitting: Neurology

## 2012-12-26 ENCOUNTER — Encounter: Payer: Self-pay | Admitting: Neurology

## 2012-12-26 VITALS — BP 116/68 | HR 84 | Temp 97.5°F | Resp 18 | Wt 179.0 lb

## 2012-12-26 DIAGNOSIS — R413 Other amnesia: Secondary | ICD-10-CM

## 2012-12-26 DIAGNOSIS — R259 Unspecified abnormal involuntary movements: Secondary | ICD-10-CM

## 2012-12-26 DIAGNOSIS — R251 Tremor, unspecified: Secondary | ICD-10-CM

## 2012-12-26 MED ORDER — CYANOCOBALAMIN 1000 MCG/ML IJ SOLN
1000.0000 ug | Freq: Once | INTRAMUSCULAR | Status: AC
  Administered 2012-12-26: 1000 ug via INTRAMUSCULAR

## 2012-12-26 MED ORDER — PRIMIDONE 50 MG PO TABS: 50.0000 mg | ORAL_TABLET | Freq: Two times a day (BID) | ORAL | Status: DC

## 2012-12-26 NOTE — Patient Instructions (Addendum)
1.  Increase primidone to 50 mg TWICE daily 2.  Exercise safely! 3.  Quit watching TV 4.  Enjoy your spring!

## 2012-12-26 NOTE — Addendum Note (Signed)
Addended by: Benay Spice on: 12/26/2012 04:33 PM   Modules accepted: Orders

## 2012-12-26 NOTE — Progress Notes (Signed)
Erika Rivera was seen today in the movement disorders clinic for neurologic f/u for tremor and memory loss.   This patient is accompanied in the office by her child who supplements the history.   I weaned her off the L-dopa because I did not think that she was parkinsonian.  She has noticed no difference off of the medication compared to when she was on the medication.  She continues to have tremor that is most bothersome when she goes to pour something or when she eats.  She remains on propranolol 160 mg daily.  She was started on primidone last visit.  She is having no SE but she doesn't think it has helped.  Her memory is about the same.  She is not exercising.  She sits and watches TV all day and may do some "puzzles."  There is no fam hx of tremor.   Specific Symptoms:  Tremor: as above Voice: a little more quiet Sleep: trouble sleeping because worked at nights for years and has always had trouble sleeping during night  Vivid Dreams:  no  Acting out dreams:  no Wet Pillows: no Postural symptoms:  Some off balance but daughter noticed worse because not exercising  Falls?  No, none since placement of ICD Loss of smell:  no Loss of taste:  no Urinary Incontinence:  Bladder control problem for years but only needs pad Difficulty Swallowing:  ok Handwriting, micrographia: shaky, and some smaller than in the past Trouble with ADL's:  No, except cannot get into bath d/t balance  Trouble buttoning clothing:  no Depression:  Yes, and increased agitation c/t past.  No suicidal ideation Memory changes:  Some, daughter has paid bills since June 2013 because pt would be giving away money  Hallucinations:  no  visual distortions: no N/V:  no Lightheaded:  no  Syncope: not since placement of ICD in 11/2011 Diplopia:  no  PREVIOUS MEDICATIONS: Sinemet  ALLERGIES:   Allergies  Allergen Reactions  . Diphenhydramine Hcl     unsure  . Penicillins Rash    CURRENT MEDICATIONS:  Current  Outpatient Prescriptions on File Prior to Visit  Medication Sig Dispense Refill  . amLODipine (NORVASC) 10 MG tablet Take 1 tablet (10 mg total) by mouth daily.  30 tablet  6  . BENICAR 40 MG tablet TAKE 1 TABLET BY MOUTH EVERY DAY  90 tablet  3  . Calcium Carbonate-Vitamin D (CALCIUM + D PO) Take by mouth.      . Cholecalciferol (VITAMIN D3) 5000 UNITS TABS Take 5,000 Units by mouth daily.  30 tablet    . levothyroxine (SYNTHROID, LEVOTHROID) 150 MCG tablet TAKE 1 TABLET (150 MCG TOTAL) BY MOUTH DAILY.  30 tablet  5  . potassium chloride (K-DUR,KLOR-CON) 10 MEQ tablet TAKE ONLY ON MON, WED, FRI'S  30 tablet  11  . propranolol ER (INDERAL LA) 160 MG SR capsule Take 1 capsule (160 mg total) by mouth daily.  30 capsule  6  . simvastatin (ZOCOR) 20 MG tablet 1 BY MOUTH AT BEDTIME  30 tablet  5  . peg 3350 powder (MOVIPREP) 100 G SOLR moviprep as directed, no substitutions  1 kit  0   No current facility-administered medications on file prior to visit.    PAST MEDICAL HISTORY:   Past Medical History  Diagnosis Date  . ARTHRITIS, HAND   . ARTHRITIS, LEFT SHOULDER   . HEARING LOSS   . SMOKER   . CEREBROVASCULAR ACCIDENT, HX OF   .  HYPERTENSION   . HYPERLIPIDEMIA   . HYPOTHYROIDISM   . OVERACTIVE BLADDER   . Cerebral aneurysm     s/p clipping  . CAD (coronary artery disease)     LHC 12/2011: RCA 75% (neg FFR), EF 55-65% - med Tx  . Polymorphic ventricular tachycardia     s/p St Jude AICD (dual chamber) 12/2011;  Echo: mild LVH, EF 50-555, grade 1 diast dysfxn, trivial AI.   Marland Kitchen Torsade de pointes     PVC dependent  . Syncope     2/2 VTach  . Bradycardia     A paced by AICD  . Essential tremor   . Osteopenia 11/18/2012    10/14/12 DEXA @ [facility]: -2.2  . Allergy   . Arthritis   . Bronchitis   . Stroke     23 years ago    PAST SURGICAL HISTORY:   Past Surgical History  Procedure Laterality Date  . Cardiac defibrillator placement  12/07/11    SJM ICD implanted for recurrent Torsades  with syncope  . Aneursym clipping      cerebral    SOCIAL HISTORY:   History   Social History  . Marital Status: Widowed    Spouse Name: N/A    Number of Children: N/A  . Years of Education: N/A   Occupational History  . Not on file.   Social History Main Topics  . Smoking status: Former Smoker -- 0.60 packs/day    Quit date: 11/28/2011  . Smokeless tobacco: Never Used     Comment: Widowed, lives with dtr. retired from [employer] and [employer]  . Alcohol Use: No  . Drug Use: No  . Sexually Active: Not on file   Other Topics Concern  . Not on file   Social History Narrative  . No narrative on file    FAMILY HISTORY:   Family Status  Relation Status Death Age  . Father Deceased     renal failure  . Mother Deceased     unknown cause of death  . Sister Alive     unknown medical health  . Brother Alive     healthy  . Brother Deceased     2, one at birth w/ hydrocephalous  . Child Deceased     CA  . Child Alive     alive and well    ROS:  A complete 10 system review of systems was obtained and was unremarkable apart from what is mentioned above.  PHYSICAL EXAMINATION:    VITALS:   Filed Vitals:   12/26/12 1513  BP: 116/68  Pulse: 84  Temp: 97.5 F (36.4 C)  Resp: 18  Weight: 179 lb (81.194 kg)    GEN:  The patient appears stated age and is in NAD. HEENT:  Normocephalic, atraumatic.  The mucous membranes are moist. The superficial temporal arteries are without ropiness or tenderness. CV:  RRR Lungs:  CTAB Neck/HEME:  There are no carotid bruits bilaterally.  Neurological examination:  Orientation: A MoCA was done today and the patient scored a 15/30.  This was done a month ago and the patient scored about the same (14/30). Cranial nerves: There is good facial symmetry. Pupils are equal round and reactive to light bilaterally.  Fundoscopic exam is attempted but the disc margins are not well visualized bilaterally. Extraocular muscles are  intact. The visual fields are full to confrontational testing. The speech is fluent and clear. Soft palate rises symmetrically and there is no tongue  deviation. Hearing is decreased to conversational tone. Sensation: Sensation is intact to light and pinprick throughout (facial, trunk, extremities). Vibration is intact at the bilateral ankle. There is no extinction with double simultaneous stimulation. There is no sensory dermatomal level identified. Motor: Strength is 5/5 in the bilateral upper and lower extremities.   Shoulder shrug is equal and symmetric.  There is no pronator drift. Deep tendon reflexes: Deep tendon reflexes are 1/4 at the bilateral biceps, triceps, brachioradialis, and absent at the bilateral patella and achilles. Plantar responses are downgoing bilaterally.  Movement examination: Tone: There is normal tone in the bilateral upper extremities.  The tone in the lower extremities is normal.  Abnormal movements: There is minimal tremor of the R outstretched hand.  There is no asterixis.  The tremor is most predominant when the patient looks at her outstretched hand.  She has minimal trouble with writing due to tremulousness. Coordination:  There is no decremation with RAM's in the hands or feet, tested with alternating supination/pronation, finger taps, hand opening and closing and heel and toe taps. Gait and Station: The patient has minimal difficulty arising out of a deep-seated chair without the use of the hands. The patient's stride length is normal and she has good arm swing.  There is mild postural instability.   LABS:   Lab Results  Component Value Date   VITAMINB12 288 07/22/2012      ASSESSMENT:   1.  Tremor.  I see no evidence of idiopathic PD or any of the parkinsonian syndromes.  Tremor may be due to prior brain injury from aneurysm and resulting encephalomalacia.  She is off of carbidopa/levodopa without changes from previous. 2.  Memory loss.  I suspect that  this represents a dementia of the Alzheimers type but it could be vascular in nature. 3.  Mild B12 deficiency. 4.  Hx of cerebral aneurysm greater than 20 years ago.  PLAN:   1.  We discussed the diagnosis as well as pathophysiology of the disease.  We discussed treatment options as well as prognostic indicators.  Patient education was provided. 2.  We are going to increased primidone, 50 mg to bid dosing. .   Risks, benefits, side effects and alternative therapies were discussed.  The opportunity to ask questions was given and they were answered to the best of my ability.  The patient expressed understanding and willingness to follow the outlined treatment protocols. 3. Her next  Monthly B12 injection was given in the office today.  She will get one B12 injection weekly for the next month and then we will go to monthly. 4.  In the future, I would like to see her on an acetylcholinesterase inhibitor and perhaps an NMDA receptor antagonist for memory in the future.  I told them that we would likely do this in the future after we have changed tremor meds. 5. As above, the patient asked me about starting her on aspirin as her cardiologist wanted her to do that.  I think that the benefits of the aspirin now outweigh the risks, but I talked to her extensively about her risk because of previous aneurysm and also given her risk for falls.  She would like to proceed with the 81 mg aspirin as with her daughter.  I have no objection to that. 6.  No Follow-up on file.

## 2012-12-30 ENCOUNTER — Ambulatory Visit: Payer: Medicare Other

## 2013-01-05 ENCOUNTER — Ambulatory Visit (AMBULATORY_SURGERY_CENTER): Payer: Medicare Other | Admitting: Internal Medicine

## 2013-01-05 ENCOUNTER — Encounter: Payer: Self-pay | Admitting: Internal Medicine

## 2013-01-05 VITALS — BP 132/65 | HR 70 | Temp 97.6°F | Resp 18 | Ht 64.0 in | Wt 179.0 lb

## 2013-01-05 DIAGNOSIS — K635 Polyp of colon: Secondary | ICD-10-CM

## 2013-01-05 DIAGNOSIS — Z1211 Encounter for screening for malignant neoplasm of colon: Secondary | ICD-10-CM

## 2013-01-05 DIAGNOSIS — D129 Benign neoplasm of anus and anal canal: Secondary | ICD-10-CM

## 2013-01-05 DIAGNOSIS — D128 Benign neoplasm of rectum: Secondary | ICD-10-CM

## 2013-01-05 DIAGNOSIS — D126 Benign neoplasm of colon, unspecified: Secondary | ICD-10-CM

## 2013-01-05 MED ORDER — SODIUM CHLORIDE 0.9 % IV SOLN: 500.0000 mL | INTRAVENOUS | Status: DC

## 2013-01-05 NOTE — Progress Notes (Signed)
Patient did not experience any of the following events: a burn prior to discharge; a fall within the facility; wrong site/side/patient/procedure/implant event; or a hospital transfer or hospital admission upon discharge from the facility. (G8907) Patient did not have preoperative order for IV antibiotic SSI prophylaxis. (G8918)  

## 2013-01-05 NOTE — Patient Instructions (Addendum)
YOU HAD AN ENDOSCOPIC PROCEDURE TODAY AT THE [facility]: Refer to the procedure report that was given to you for any specific questions about what was found during the examination.  If the procedure report does not answer your questions, please call your gastroenterologist to clarify.  If you requested that your care partner not be given the details of your procedure findings, then the procedure report has been included in a sealed envelope for you to review at your convenience later.  WE WILL NEED TO SEE YOU IN 3-6 MONTHS FOR ANOTHER COLONOSCOPY TO CHECK THAT AREA.  YOU SHOULD EXPECT: Some feelings of bloating in the abdomen. Passage of more gas than usual.  Walking can help get rid of the air that was put into your GI tract during the procedure and reduce the bloating. If you had a lower endoscopy (such as a colonoscopy or flexible sigmoidoscopy) you may notice spotting of blood in your stool or on the toilet paper. If you underwent a bowel prep for your procedure, then you may not have a normal bowel movement for a few days.  DIET: Your first meal following the procedure should be a light meal and then it is ok to progress to your normal diet.  A half-sandwich or bowl of soup is an example of a good first meal.  Heavy or fried foods are harder to digest and may make you feel nauseous or bloated.  Likewise meals heavy in dairy and vegetables can cause extra gas to form and this can also increase the bloating.  Drink plenty of fluids but you should avoid alcoholic beverages for 24 hours.  ACTIVITY: Your care partner should take you home directly after the procedure.  You should plan to take it easy, moving slowly for the rest of the day.  You can resume normal activity the day after the procedure however you should NOT DRIVE or use heavy machinery for 24 hours (because of the sedation medicines used during the test).    SYMPTOMS TO REPORT IMMEDIATELY: A gastroenterologist can be reached at  any hour.  During normal business hours, 8:30 AM to 5:00 PM Monday through Friday, call [phone].  After hours and on weekends, please call the GI answering service at [phone] who will take a message and have the physician on call contact you.   Following lower endoscopy (colonoscopy or flexible sigmoidoscopy):  Excessive amounts of blood in the stool  Significant tenderness or worsening of abdominal pains  Swelling of the abdomen that is new, acute  Fever of 100F or higher  FOLLOW UP: If any biopsies were taken you will be contacted by phone or by letter within the next 1-3 weeks.  Call your gastroenterologist if you have not heard about the biopsies in 3 weeks.  Our staff will call the home number listed on your records the next business day following your procedure to check on you and address any questions or concerns that you may have at that time regarding the information given to you following your procedure. This is a courtesy call and so if there is no answer at the home number and we have not heard from you through the emergency physician on call, we will assume that you have returned to your regular daily activities without incident.  SIGNATURES/CONFIDENTIALITY: You and/or your care partner have signed paperwork which will be entered into your electronic medical record.  These signatures attest to the fact that that the information above on your  After Visit Summary has been reviewed and is understood.  Full responsibility of the confidentiality of this discharge information lies with you and/or your care-partner.   NO ASPIRIN NOR NSAIDS FOR TWO WEEKS; ONLY TYLENOL OR ELSE YOU MIGHT BLEED.  PLEASE, CALL YOUR CARDIOLOGIST WHEN YOU GET HOME, BECAUSE WE HAD TO DE ACTIVATE YOUR ICD.   THIS IS CRUCIAL!!!

## 2013-01-05 NOTE — Op Note (Signed)
[facility] [address] [place], [address]   COLONOSCOPY PROCEDURE REPORT  PATIENT: Erika Rivera, Erika Rivera  MR#: [id number] BIRTHDATE: Jan 12, 1936 , 76  yrs. old GENDER: Female ENDOSCOPIST: Beverley Fiedler, MD REFERRED [id number], Valerie PROCEDURE DATE:  01/05/2013 PROCEDURE:   Colonoscopy with snare polypectomy ASA CLASS:   Class III INDICATIONS:average risk screening and first colonoscopy. MEDICATIONS: MAC sedation, administered by CRNA, Propofol (Diprivan), and Propofol (Diprivan) 140 mg IV  DESCRIPTION OF PROCEDURE:   After the risks benefits and alternatives of the procedure were thoroughly explained, informed consent was obtained.  A digital rectal exam revealed no rectal mass.   The LB PCF-H180AL [id number]  endoscope was introduced through the anus and advanced to the terminal ileum which was intubated for a short distance. No adverse events experienced.   The quality of the prep was good, using MoviPrep  The instrument was then slowly withdrawn as the colon was fully examined.  COLON FINDINGS: The mucosa appeared normal in the terminal ileum. A sessile polyp measuring 25 mm in size was found at the ileocecal valve (clear separation between ileal mucosa and start of adenomatous tissue).  A polypectomy was performed in piece meal fashion using snare cautery.  The resection was complete and the polyp tissue was completely retrieved.   A sessile polyp measuring 6 mm in size was found in the sigmoid colon.  A polypectomy was performed with a cold snare.  The resection was complete and the polyp tissue was completely retrieved.   Mild diverticulosis was noted in the sigmoid colon.  Retroflexed views revealed no abnormalities. The time to cecum=3 minutes 43 seconds.  Withdrawal time=17 minutes 30 seconds.  The scope was withdrawn and the procedure completed. COMPLICATIONS: There were no complications.  ENDOSCOPIC IMPRESSION: 1.   Normal mucosa in the terminal  ileum 2.   Sessile polyp measuring 25 mm in size was found at the ileocecal valve; polypectomy was performed using snare cautery 3.   Sessile polyp measuring 6 mm in size was found in the sigmoid colon; polypectomy was performed with a cold snare 4.   Mild diverticulosis was noted in the sigmoid colon  RECOMMENDATIONS: 1.  Hold aspirin, aspirin products, and anti-inflammatory medication for 2 weeks. 2.  Await pathology results 3.  Given piecemeal resection, repeat colonoscopy in 3-6 months (one hour slot) 4.  You will receive a letter within 1-2 weeks with the results of your biopsy as well as final recommendations.  Please call my office if you have not received a letter after 3 weeks.   eSigned:  Beverley Fiedler, MD 01/05/2013 12:27 PM cc: The Patient

## 2013-01-05 NOTE — Progress Notes (Signed)
Pt was advised to call her cardiologist to have ICD checked after we deactivated it for her procedure, the cardiologist office call and stated that they did not need to check her ICD, it would go back to normal after magnet was removed

## 2013-01-05 NOTE — Progress Notes (Signed)
Propofol given over incremental dosages.Magnet used due to AICD. PACU aware to inform patient family about reactivation call to company

## 2013-01-05 NOTE — Progress Notes (Signed)
Called to room to assist during endoscopic procedure.  Patient ID and intended procedure confirmed with present staff. Received instructions for my participation in the procedure from the performing physician.  

## 2013-01-05 NOTE — Progress Notes (Signed)
Patient did not have preoperative order for IV antibiotic SSI prophylaxis. ([id number])   

## 2013-01-06 ENCOUNTER — Telehealth: Payer: Self-pay | Admitting: *Deleted

## 2013-01-06 NOTE — Telephone Encounter (Signed)
No answer, left message to call if questions or concerns. 

## 2013-01-11 ENCOUNTER — Encounter: Payer: Self-pay | Admitting: Internal Medicine

## 2013-01-27 ENCOUNTER — Other Ambulatory Visit (INDEPENDENT_AMBULATORY_CARE_PROVIDER_SITE_OTHER): Payer: Medicare Other | Admitting: Neurology

## 2013-01-27 ENCOUNTER — Ambulatory Visit: Payer: Medicare Other

## 2013-01-27 DIAGNOSIS — E538 Deficiency of other specified B group vitamins: Secondary | ICD-10-CM

## 2013-01-27 MED ORDER — CYANOCOBALAMIN 1000 MCG/ML IJ SOLN
1000.0000 ug | Freq: Once | INTRAMUSCULAR | Status: AC
  Administered 2013-01-27: 1000 ug via INTRAMUSCULAR

## 2013-02-24 ENCOUNTER — Ambulatory Visit: Payer: Medicare Other

## 2013-02-28 ENCOUNTER — Encounter: Payer: Medicare Other | Admitting: Neurology

## 2013-02-28 ENCOUNTER — Other Ambulatory Visit (INDEPENDENT_AMBULATORY_CARE_PROVIDER_SITE_OTHER): Payer: Medicare Other | Admitting: Neurology

## 2013-02-28 DIAGNOSIS — E538 Deficiency of other specified B group vitamins: Secondary | ICD-10-CM

## 2013-02-28 MED ORDER — CYANOCOBALAMIN 1000 MCG/ML IJ SOLN
1000.0000 ug | INTRAMUSCULAR | Status: DC
  Administered 2013-02-28: 1000 ug via INTRAMUSCULAR

## 2013-02-28 NOTE — Progress Notes (Signed)
Error

## 2013-03-04 ENCOUNTER — Other Ambulatory Visit: Payer: Self-pay | Admitting: Internal Medicine

## 2013-03-06 ENCOUNTER — Encounter: Payer: Medicare Other | Admitting: *Deleted

## 2013-03-06 ENCOUNTER — Encounter: Payer: Self-pay | Admitting: Internal Medicine

## 2013-03-07 ENCOUNTER — Encounter: Payer: Self-pay | Admitting: *Deleted

## 2013-03-09 ENCOUNTER — Other Ambulatory Visit: Payer: Self-pay | Admitting: *Deleted

## 2013-03-09 MED ORDER — PROPRANOLOL HCL ER 160 MG PO CP24: 160.0000 mg | ORAL_CAPSULE | Freq: Every day | ORAL | Status: DC

## 2013-03-16 ENCOUNTER — Other Ambulatory Visit: Payer: Self-pay | Admitting: Internal Medicine

## 2013-03-20 ENCOUNTER — Encounter: Payer: Self-pay | Admitting: Internal Medicine

## 2013-03-20 ENCOUNTER — Ambulatory Visit (INDEPENDENT_AMBULATORY_CARE_PROVIDER_SITE_OTHER): Payer: Medicare Other | Admitting: *Deleted

## 2013-03-20 DIAGNOSIS — I4729 Other ventricular tachycardia: Secondary | ICD-10-CM

## 2013-03-20 DIAGNOSIS — Z95810 Presence of automatic (implantable) cardiac defibrillator: Secondary | ICD-10-CM

## 2013-03-20 DIAGNOSIS — I472 Ventricular tachycardia: Secondary | ICD-10-CM

## 2013-03-21 LAB — REMOTE ICD DEVICE
AL IMPEDENCE ICD: 360 Ohm
ATRIAL PACING ICD: 91 pct
BAMS-0001: 170 {beats}/min
DEV-0020ICD: NEGATIVE
HV IMPEDENCE: 74 Ohm
RV LEAD IMPEDENCE ICD: 590 Ohm
TZON-0004SLOWVT: 16
TZON-0005SLOWVT: 6
TZON-0010SLOWVT: 40 ms
VENTRICULAR PACING ICD: 34 pct

## 2013-03-31 ENCOUNTER — Ambulatory Visit (INDEPENDENT_AMBULATORY_CARE_PROVIDER_SITE_OTHER): Payer: Medicare Other | Admitting: Neurology

## 2013-03-31 ENCOUNTER — Other Ambulatory Visit (INDEPENDENT_AMBULATORY_CARE_PROVIDER_SITE_OTHER): Payer: Medicare Other

## 2013-03-31 ENCOUNTER — Ambulatory Visit (INDEPENDENT_AMBULATORY_CARE_PROVIDER_SITE_OTHER): Payer: Medicare Other | Admitting: Internal Medicine

## 2013-03-31 ENCOUNTER — Encounter: Payer: Self-pay | Admitting: Internal Medicine

## 2013-03-31 VITALS — BP 134/72 | HR 74 | Temp 96.7°F | Wt 175.8 lb

## 2013-03-31 DIAGNOSIS — E538 Deficiency of other specified B group vitamins: Secondary | ICD-10-CM

## 2013-03-31 DIAGNOSIS — I10 Essential (primary) hypertension: Secondary | ICD-10-CM

## 2013-03-31 DIAGNOSIS — E785 Hyperlipidemia, unspecified: Secondary | ICD-10-CM

## 2013-03-31 DIAGNOSIS — R259 Unspecified abnormal involuntary movements: Secondary | ICD-10-CM

## 2013-03-31 DIAGNOSIS — E039 Hypothyroidism, unspecified: Secondary | ICD-10-CM

## 2013-03-31 LAB — BASIC METABOLIC PANEL
BUN: 15 mg/dL (ref 6–23)
Chloride: 106 mEq/L (ref 96–112)
Creatinine, Ser: 1.3 mg/dL — ABNORMAL HIGH (ref 0.4–1.2)
GFR: 49.61 mL/min — ABNORMAL LOW (ref >60.00)

## 2013-03-31 LAB — TSH: TSH: 0.04 u[IU]/mL — ABNORMAL LOW (ref 0.35–5.50)

## 2013-03-31 MED ORDER — CYANOCOBALAMIN 1000 MCG/ML IJ SOLN
1000.0000 ug | Freq: Once | INTRAMUSCULAR | Status: AC
  Administered 2013-03-31: 1000 ug via INTRAMUSCULAR

## 2013-03-31 NOTE — Assessment & Plan Note (Signed)
hx noncompliance with meds summer 2012 Started working with endo for same fall 2012 - reports now taking meds and stabilized 12/2011 Check today - adjust if needed  Lab Results  Component Value Date   TSH 0.04* 09/22/2012

## 2013-03-31 NOTE — Assessment & Plan Note (Signed)
BP Readings from Last 3 Encounters:  03/31/13 134/72  01/05/13 132/65  12/26/12 116/68   Added Bbloc to ARB/hct 04/2011 for BP control and RUE tremor symptoms  Amlodipine added by cards 03/2012 Encouraged compliance -no changes at this time Check Cr - ?progressive mild CKD

## 2013-03-31 NOTE — Progress Notes (Signed)
Subjective:    Patient ID: Erika Rivera, female    DOB: Feb 16, 1936, 77 y.o.   MRN: [id number]  HPI  here for followup - reviewed chronic medical issues  Hx syncope events: Seen in ER 10/02/11 for syncope, standing on porch and "went out", hit head on back of wall -  no seizure or chest pain, momentary loss of consciousness  - dx UTI on Er review 2nd syncope event 10/20/11 at home upon getting out of bed and going to bathroom - "head got fuzzy and i saw myself going down" - dtr witnessed both events - no incontinence or confusion 2 more events 11/2011 prompting return to ER> dx brady and PMVT S/p LHC, EP, echo> ICD placed 12/2011 - follows with EP for same  dyslipidemia - on statin since fall 2010 -the patient reports compliance with medication(s) as prescribed. Denies adverse side effects.  hypothyroid - no longer working with endo on same - hx variable compliance -denies adverse side effects related to current therapy.   arthiritis - reports compliance with ongoing medical treatment and no changes in medication dose or frequency.  denies adverse side effects related to current therapy.   hypertension - reports compliance with ongoing medical treatment and no changes in medication dose or frequency. denies adverse side effects related to current therapy. no chest pain, edema or shortness of breath    Essential tremor - working with neuro - started primidone 09/2012-the patient reports compliance with medication(s) as prescribed. Denies adverse side effects.   Past Medical History  Diagnosis Date  . ARTHRITIS, HAND   . ARTHRITIS, LEFT SHOULDER   . HEARING LOSS   . SMOKER   . CEREBROVASCULAR ACCIDENT, HX OF   . HYPERTENSION   . HYPERLIPIDEMIA   . HYPOTHYROIDISM   . OVERACTIVE BLADDER   . Cerebral aneurysm     s/p clipping  . CAD (coronary artery disease)     LHC 12/2011: RCA 75% (neg FFR), EF 55-65% - med Tx  . Polymorphic ventricular tachycardia     s/p [employer] AICD (dual  chamber) 12/2011;  Echo: mild LVH, EF 50-555, grade 1 diast dysfxn, trivial AI.   Marland Kitchen Torsade de pointes     PVC dependent  . Syncope     2/2 VTach  . Bradycardia     A paced by AICD  . Essential tremor   . Osteopenia 11/18/2012    10/14/12 DEXA @ [facility]: -2.2  . Allergy   . Arthritis   . Bronchitis   . Stroke     23 years ago    Review of Systems  Respiratory: Negative for shortness of breath and wheezing.   Cardiovascular: Negative for chest pain and palpitations.  Neurological: Negative for dizziness, facial asymmetry, speech difficulty, weakness, light-headedness and headaches.       Objective:   Physical Exam  BP 134/72  Pulse 74  Temp(Src) 96.7 F (35.9 C) (Oral)  Wt 175 lb 12.8 oz (79.742 kg)  BMI 30.16 kg/m2  SpO2 96% Wt Readings from Last 3 Encounters:  03/31/13 175 lb 12.8 oz (79.742 kg)  01/05/13 179 lb (81.194 kg)  12/26/12 179 lb (81.194 kg)   Constitutional: She appears well-developed and well-nourished. No distress. dtr x2 at side - chronically HOH without hearing aides today Neck: Normal range of motion. Neck supple. No JVD present. No thyromegaly present.  Cardiovascular: Normal rate, regular rhythm and normal heart sounds.  No murmur heard. No BLE edema. device in L anterior wall  without swelling, bruising or erythema around pocket.  Pulmonary/Chest: Effort normal and breath sounds normal. No respiratory distress. She has no wheezes.  Neuro: AAOx3, CN2-12 symmetrically intact - speech fluent, follows 3 step commands and MAE well - gait normal, unaided - nearly undetectable tremor in R hand Psychiatric: She has a normal mood and affect. Her behavior is normal. Judgment and thought content normal.    Lab Results  Component Value Date   WBC 5.8 12/07/2011   HGB 11.8* 12/07/2011   HCT 36.9 12/07/2011   PLT 247 12/07/2011   CHOL 120 09/22/2012   TRIG 71.0 09/22/2012   HDL 40.80 09/22/2012   LDLDIRECT 138.6 04/23/2011   ALT 10 04/23/2011   AST 25 04/23/2011   NA  138 09/22/2012   K 4.4 09/22/2012   CL 107 09/22/2012   CREATININE 1.6* 09/22/2012   BUN 24* 09/22/2012   CO2 22 09/22/2012   TSH 0.04* 09/22/2012   INR 1.03 12/07/2011   HGBA1C 5.1 09/22/2012        Assessment & Plan:   See problem list. Medications and labs reviewed today.

## 2013-03-31 NOTE — Progress Notes (Signed)
  Subjective:    Patient ID: Erika Rivera, female    DOB: 1935-12-16, 77 y.o.   MRN: [id number]  HPI Patient presents for a 6 month follow up of chronic medical problems  Dyslipidemia reports compliance with medication(s) as prescribed. Denies adverse side effects.   Hypothyroid variable compliance -denies adverse side effects related to current therapy. Denies consistant diarrhea or constipation, reports cold tolerance. Wearing a jacket and jeans and still reports being cold. Denies hair skin or nail changes  arthiritis reports compliance with ongoing medical treatment and no changes in medication dose or frequency. denies adverse side effects related to current therapy. Denies any progression of joint pain  Hypertension  reports compliance with ongoing medical treatment and no changes in medication dose or frequency. denies adverse side effects related to current therapy. no chest pain, edema   Essential tremor  working with neuro - increased primidone dosage  In March. -the patient reports compliance with medication(s) as prescribed. Have seen a decrease in tremor activity. Denies adverse side effects     Review of Systems HEENT: blurred vision, headache, rhinorrhea, sore throat Cardiac: reports occasional chest pain, unrelated to exerton, denies chest pain, palipitations or tachycardia Pulm: reports occasional SOB which is baseline for this patient. No recent increase. Denies cough GI: Denies abdominal pain, diarrhea, constipation, hematuria, melena, hematochezia Neuro: 4th and 5th finger occasional paresthesias. No other focal or motor weakness MSK: reports "knots" in her hands, denies stiffness, paresthesias in 4th and 5th digits on left side    Objective:   Physical Exam General HEENT Cardiac Pulm GI Neuro MSK       Assessment & Plan:

## 2013-03-31 NOTE — Patient Instructions (Signed)
It was good to see you today. We have reviewed your prior records including labs and tests today Medications reviewed, no changes at this time. Test(s) ordered today. Your results will be released to MyChart (or called to you) after review, usually within 72hours after test completion. If any changes need to be made, you will be notified at that same time. Please scheduled followup in 6 months, call sooner if problems.

## 2013-04-01 NOTE — Assessment & Plan Note (Signed)
Hx CVA - but Noncompliance with statin summer 2012 Improved compliance since - Check annually The current medical regimen is effective;  continue present plan and medications.

## 2013-04-01 NOTE — Assessment & Plan Note (Signed)
Chronic and progressive R hand tremor -  S/p neuro eval/tx sprng 2013 >> dx BET  Not improved with sinemet trial; now on primidone in addition to Inderal - improved The current medical regimen is effective;  continue present plan and medications.  

## 2013-04-03 NOTE — Progress Notes (Signed)
Pt here for RN visit for injection of B12 only.  Did not see physician

## 2013-04-11 ENCOUNTER — Encounter: Payer: Self-pay | Admitting: *Deleted

## 2013-04-18 ENCOUNTER — Other Ambulatory Visit: Payer: Self-pay | Admitting: Physician Assistant

## 2013-05-03 IMAGING — CR DG CHEST 2V
2 series · 2 of 2 positions shown · non-contrast
Comparison: None.

CLINICAL DATA: Syncope; leg swelling.

CHEST - 2 VIEW

[w chest pa]
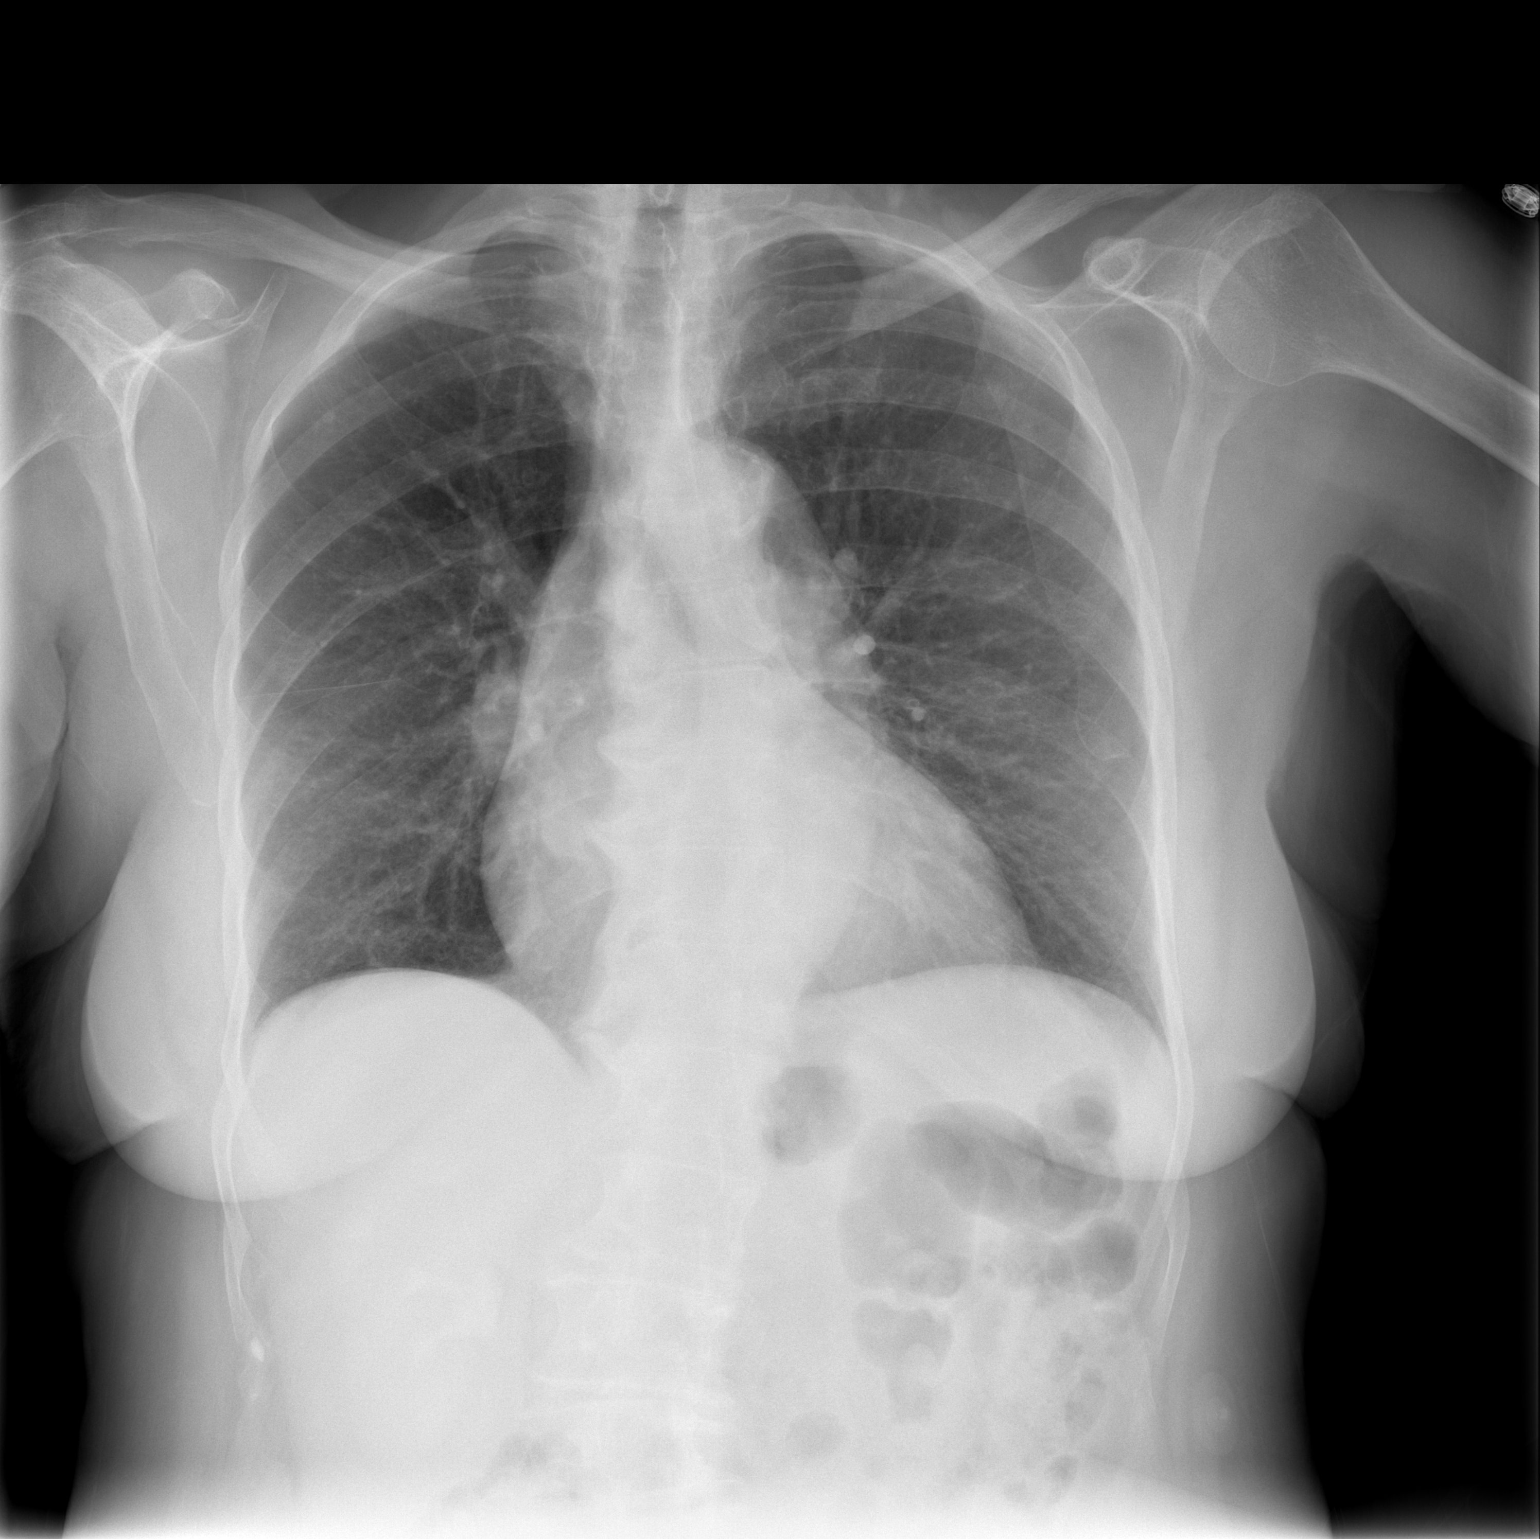

[w chest lat]
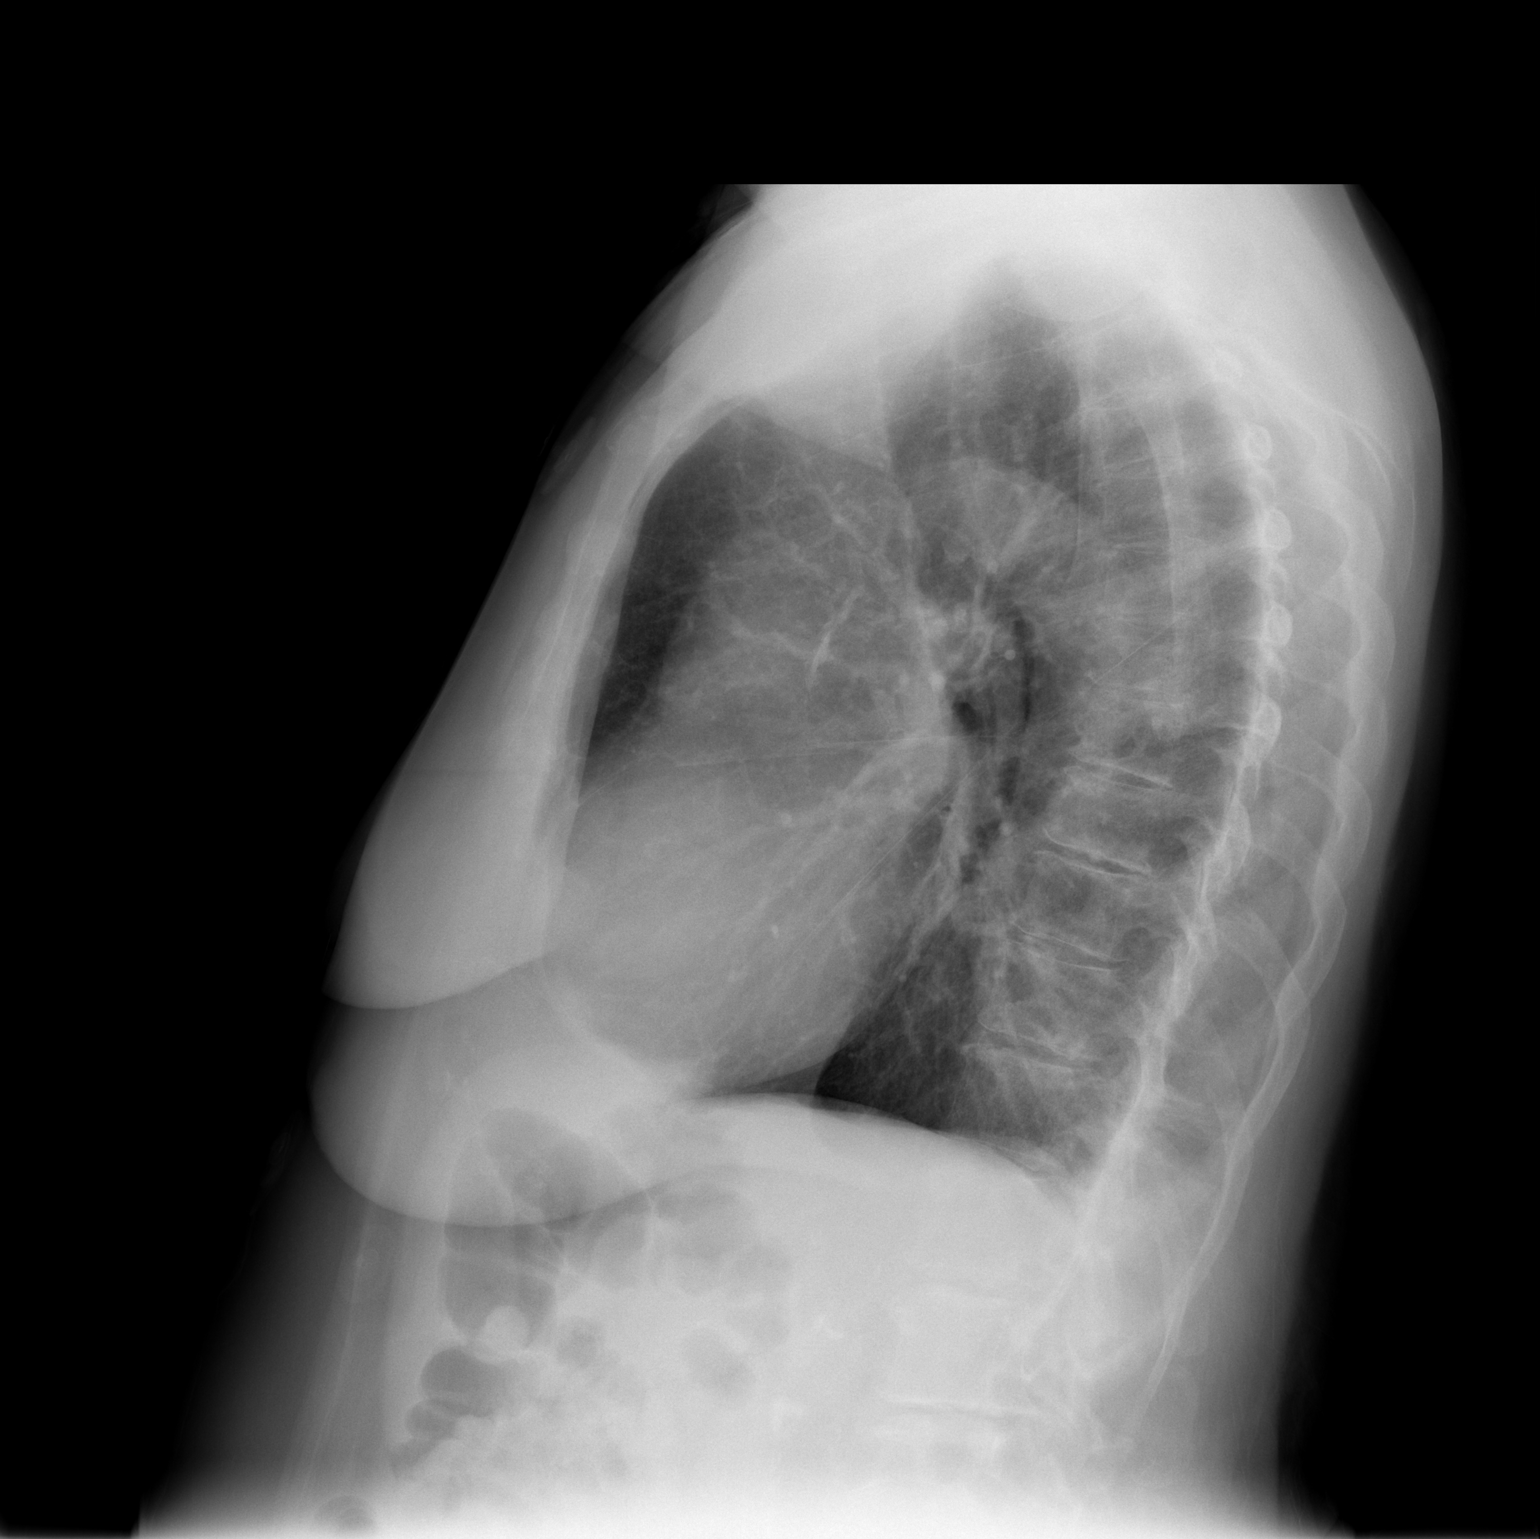

[2 of 2 positions shown; findings below may reference images not displayed]

FINDINGS: The lungs are well-aerated and clear.  There is no
evidence of focal opacification, pleural effusion or pneumothorax.

The heart is borderline normal in size; calcification is noted
within the aortic arch.  No acute osseous abnormalities are seen.
Mild degenerative change is noted along the thoracic spine; nodular
density overlying the mid thoracic spine on the lateral view is
thought to reflect lateral osteophytes.
IMPRESSION: No acute cardiopulmonary process seen.

## 2013-05-03 IMAGING — CT CT ANGIO HEAD
1 of 11 series · 4 of 33 positions shown · IV contrast (CONTRAST)
Comparison: Head CT without contrast 10/27/1935.

CTA NECK

CLINICAL DATA: 75-year-old female with syncope, head trauma.
History of intracranial aneurysm clipping.

CT ANGIOGRAPHY HEAD AND NECK
TECHNIQUE: Multidetector CT imaging of the head and neck was
performed using the standard protocol during bolus administration
of intravenous contrast.  Multiplanar CT image reconstructions
including MIPs were obtained to evaluate the vascular anatomy.
Carotid stenosis measurements (when applicable) are obtained
utilizing NASCET criteria, using the distal internal carotid
diameter as the denominator.
Contrast: 50mL OMNIPAQUE IOHEXOL 350 MG/ML IV SOLN

[ax · axial · 0.45mm/px · z∈[+917,+1107]mm · 4 of 325 slices shown]
[im 65/325  soft-tissue]
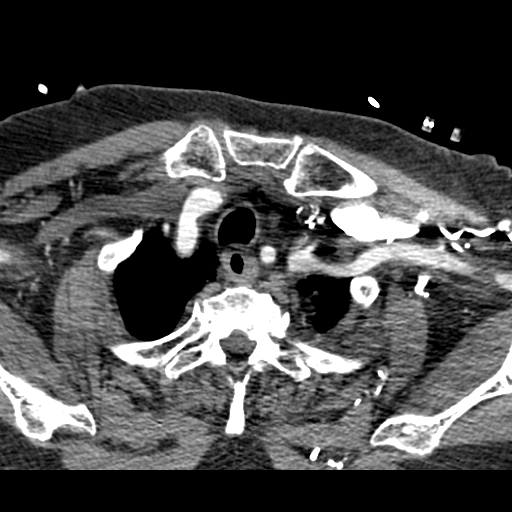
[im 130/325  bone]
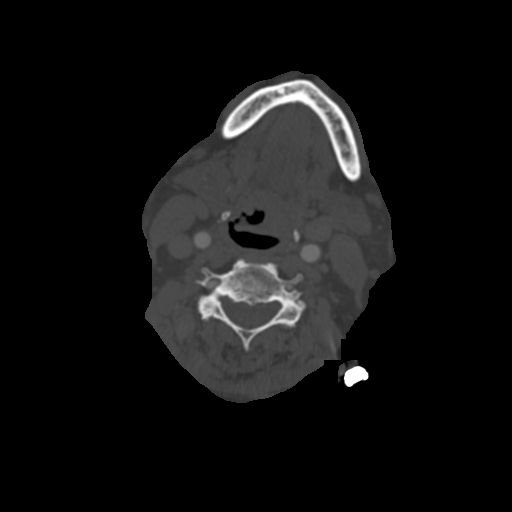
[im 195/325  soft-tissue]
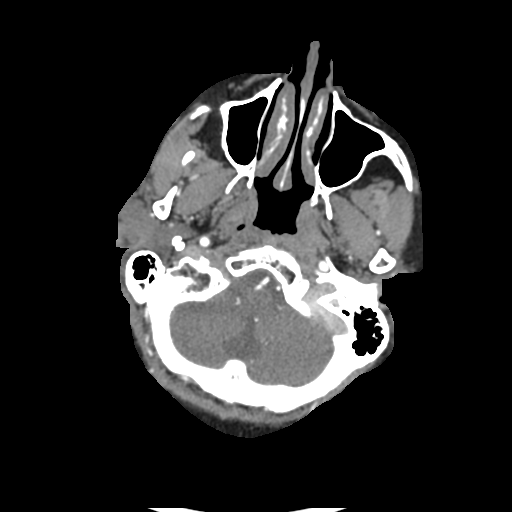
[im 260/325  bone]
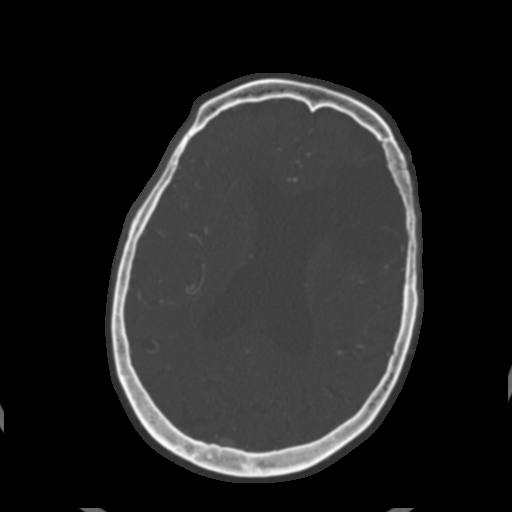

[4 of 33 positions shown; findings below may reference images not displayed]

FINDINGS: Retained secretions in the trachea.  Minor dependent
atelectasis in the lung apices.  No superior mediastinal
lymphadenopathy.  Thyroid, oropharynx, nasopharynx, parapharyngeal
spaces, retropharyngeal space, sublingual space, submandibular
glands and parotid glands are within normal limits.  Asymmetry of
the left vallecula and piriform sinus is noted without discrete
mass.  No cervical lymphadenopathy.  Cervical spine degenerative
changes. Visualized paranasal sinuses and mastoids are clear.  Mild
upper thoracic spondylolisthesis. No acute osseous abnormality
identified.  Visualized orbit soft tissues are within normal
limits.

Vascular Findings: Moderate to severe arch atherosclerosis.  Bovine
arch configuration.  No common carotid artery origin stenosis.  No
significant subclavian artery origin stenosis.

Tortuous right common carotid artery.  Mild right carotid
bifurcation and proximal right ICA atherosclerosis.  No cervical
right ICA stenosis.  Tortuosity is noted.

Right vertebral artery origin is normal.  Tortuosity of the right
vertebral artery throughout the neck which remains patent without
stenosis to the skull base.

Minimal atherosclerosis at the left carotid bifurcation.  Mild left
ICA bulb atherosclerosis.  Tortuous left ICA with a kinked
ICA stenosis.

Left vertebral artery origin is within normal limits.  Tortuous
left vertebral artery throughout the neck is patent to the skull
base without stenosis.

 Review of the MIP images confirms the above findings.
IMPRESSION: 1.  Tortuosity of the cervical ICA resulting in a kinked appearance
on the left.  No significant atherosclerotic carotid stenosis in
the neck.
2.  Tortuous vertebral artery without significant stenosis.
3.  Intracranial findings are below.
4.  Retained secretions in the trachea.  Asymmetric appearance of
the left vallecula and piriform sinus may also reflect retained
secretions.

CTA HEAD
FINDINGS: Remote osseous changes of left frontotemporal
craniotomy. No acute osseous abnormality identified.  Visualized
scalp soft tissues are within normal limits.  Streak artifact from
left ICA terminus region aneurysm clip.  Partially empty sella
configuration.  Left anterior temporal and frontal lobe chronic
encephalomalacia. Small area of cortical encephalomalacia in the
posterior left operculum (series 2 image 15).  No ventriculomegaly.
No midline shift, mass effect, or evidence of mass lesion.  Small
chronic lacunar infarct in the left cerebellar hemisphere. No
evidence of cortically based acute infarction identified.  No acute
intracranial hemorrhage identified.  No abnormal enhancement
identified.

Vascular Findings: Major intracranial venous structures are
enhancing.

Patent distal vertebral arteries.  Patent bilateral PICA vessels.
Vertebrobasilar junction is within normal limits.  Mild basilar
artery irregularity and no basilar stenosis.  Superior cerebellar
artery origins are within normal limits.  Left greater than right
fetal type PCA origins.  Bilateral PCA branches are within normal
limits.

Right ICA siphon is patent without atherosclerosis.  Right
ophthalmic and posterior communicating artery origins are within
normal limits.  Nondominant right ACA A1 segment.  Right ICA
terminus otherwise is normal.  Normal right MCA origin.  Right MCA
branches are within normal limits.

The left ICA siphon is patent and asymmetrically larger than the
right.  There is mild left ICA calcified atherosclerosis.  There is
an inferiorly directed saccular aneurysm arising from the proximal
supraclinoid segment measuring 2 x 2 x 3 mm (sagittal image 91, 1
mm axial image 105).  This is proximal to the aneurysm clip.  Its
office at the left ophthalmic artery origin which is within normal
limits.
The streak artifact from the aneurysm clip partially obscures the
left posterior communicating artery origin.  That artery remains
patent.  No aneurysm enhancement at the level of the clip is
identified.

The left ICA terminus otherwise is normal.  Left MCA and ACA
origins are normal.  The left ACA A1 segment is dominant.  Normal
anterior communicating artery.  ACA branches are within normal
limits; small infundibulum at a left lenticulostriate branch origin
is noted.  Left MCA branches are normal except for a decrease in
left MCA anterior division flow compared to the right.

 Review of the MIP images confirms the above findings.
IMPRESSION: 1.  Small 2 x 2 x 3 mm saccular left ICA aneurysm arises off the
tip to the left ophthalmic artery origin and is directed
inferiorly.  This is compatible with a small left superior
hypophoseal aneurysm.
2.  No distal left ICA aneurysm recurrence detected by CTA status
post aneurysm clipping.
3.  No intracranial artery stenosis.  Mild ICA siphon and basilar
artery atherosclerosis.
4.  Chronic small medium size vessel ischemia.

Study discussed by telephone with Dr. Kojuharova on 12/04/2011 at
8428 hours.

## 2013-05-04 ENCOUNTER — Ambulatory Visit: Payer: Medicare Other

## 2013-05-04 MED ORDER — CYANOCOBALAMIN 1000 MCG/ML IJ SOLN
1000.0000 ug | Freq: Once | INTRAMUSCULAR | Status: AC
  Administered 2013-05-04: 1000 ug via INTRAMUSCULAR

## 2013-05-12 ENCOUNTER — Encounter: Payer: Self-pay | Admitting: Internal Medicine

## 2013-05-31 ENCOUNTER — Ambulatory Visit (INDEPENDENT_AMBULATORY_CARE_PROVIDER_SITE_OTHER): Payer: Medicare Other | Admitting: Neurology

## 2013-05-31 ENCOUNTER — Encounter: Payer: Self-pay | Admitting: Neurology

## 2013-05-31 VITALS — BP 128/78 | HR 84 | Temp 97.4°F | Resp 20 | Wt 176.0 lb

## 2013-05-31 DIAGNOSIS — R259 Unspecified abnormal involuntary movements: Secondary | ICD-10-CM

## 2013-05-31 DIAGNOSIS — E538 Deficiency of other specified B group vitamins: Secondary | ICD-10-CM

## 2013-05-31 DIAGNOSIS — R251 Tremor, unspecified: Secondary | ICD-10-CM

## 2013-05-31 DIAGNOSIS — F0280 Dementia in other diseases classified elsewhere without behavioral disturbance: Secondary | ICD-10-CM

## 2013-05-31 MED ORDER — PRIMIDONE 50 MG PO TABS: 50.0000 mg | ORAL_TABLET | Freq: Two times a day (BID) | ORAL | Status: DC

## 2013-05-31 MED ORDER — DONEPEZIL HCL 5 MG PO TABS: 5.0000 mg | ORAL_TABLET | Freq: Every day | ORAL | Status: DC

## 2013-05-31 MED ORDER — CYANOCOBALAMIN 1000 MCG/ML IJ SOLN
1000.0000 ug | Freq: Once | INTRAMUSCULAR | Status: AC
  Administered 2013-05-31: 1000 ug via INTRAMUSCULAR

## 2013-05-31 NOTE — Progress Notes (Signed)
Erika Rivera was seen today in the movement disorders clinic for neurologic f/u for tremor and memory loss.   This patient is accompanied in the office by her child who supplements the history.   I weaned her off the L-dopa because I did not think that she was parkinsonian.  She has noticed no difference off of the medication compared to when she was on the medication.  She continues to have tremor that is most bothersome when she goes to pour something or when she eats.  She remains on propranolol 160 mg daily.  She was started on primidone last visit.  She is having no SE but she doesn't think it has helped.  05/31/13 update:    The patient is accompanied by her daughter who supplements the history.  Her memory is about the same.  Sometimes, her daughter states that she will revert back to "her childhood." She is not exercising.  She sits and watches TV all day and may do some "puzzles."  She will sometimes with water on. Her daughter does the cooking and driving.  Her daughter does state that she has trouble walking long distances and would like to see if they can get a handicap sticker.  The patient herself does not drive.  Last visit, her primidone was increased for tremor.  Her tremor has been markedly improved.  She does have a history of B12 deficiency and has been getting B12 injections faithfully.  There is no fam hx of tremor.   PREVIOUS MEDICATIONS: Sinemet  ALLERGIES:   Allergies  Allergen Reactions  . Diphenhydramine Hcl     unsure  . Penicillins Rash    CURRENT MEDICATIONS:  Current Outpatient Prescriptions on File Prior to Visit  Medication Sig Dispense Refill  . amLODipine (NORVASC) 10 MG tablet Take 1 tablet (10 mg total) by mouth daily.  30 tablet  6  . BENICAR 40 MG tablet TAKE 1 TABLET BY MOUTH EVERY DAY  90 tablet  3  . Calcium Carbonate-Vitamin D (CALCIUM + D PO) Take by mouth.      . Cholecalciferol (VITAMIN D3) 5000 UNITS TABS Take 5,000 Units by mouth daily.   30 tablet    . KLOR-CON M10 10 MEQ tablet TAKE 1 TABLET BY MOUTH ONLY ON MON, WED, AND FRI.  30 tablet  4  . levothyroxine (SYNTHROID, LEVOTHROID) 150 MCG tablet TAKE 1 TABLET (150 MCG TOTAL) BY MOUTH DAILY.  30 tablet  5  . primidone (MYSOLINE) 50 MG tablet Take 1 tablet (50 mg total) by mouth 2 (two) times daily.  60 tablet  4  . propranolol ER (INDERAL LA) 160 MG SR capsule Take 1 capsule (160 mg total) by mouth daily.  30 capsule  6  . simvastatin (ZOCOR) 20 MG tablet 1 BY MOUTH AT BEDTIME  30 tablet  5   No current facility-administered medications on file prior to visit.    PAST MEDICAL HISTORY:   Past Medical History  Diagnosis Date  . ARTHRITIS, HAND   . ARTHRITIS, LEFT SHOULDER   . HEARING LOSS   . SMOKER   . CEREBROVASCULAR ACCIDENT, HX OF   . HYPERTENSION   . HYPERLIPIDEMIA   . HYPOTHYROIDISM   . OVERACTIVE BLADDER   . Cerebral aneurysm     s/p clipping  . CAD (coronary artery disease)     LHC 12/2011: RCA 75% (neg FFR), EF 55-65% - med Tx  . Polymorphic ventricular tachycardia     s/p St Jude  AICD (dual chamber) 12/2011;  Echo: mild LVH, EF 50-555, grade 1 diast dysfxn, trivial AI.   Marland Kitchen Torsade de pointes     PVC dependent  . Syncope     2/2 VTach  . Bradycardia     A paced by AICD  . Essential tremor   . Osteopenia 11/18/2012    10/14/12 DEXA @ [facility]: -2.2  . Allergy   . Arthritis   . Bronchitis   . Stroke     23 years ago    PAST SURGICAL HISTORY:   Past Surgical History  Procedure Laterality Date  . Cardiac defibrillator placement  12/07/11    SJM ICD implanted for recurrent Torsades with syncope  . Aneursym clipping      cerebral    SOCIAL HISTORY:   History   Social History  . Marital Status: Widowed    Spouse Name: N/A    Number of Children: N/A  . Years of Education: N/A   Occupational History  . Not on file.   Social History Main Topics  . Smoking status: Former Smoker -- 0.60 packs/day    Quit date: 11/28/2011  . Smokeless tobacco: Never  Used     Comment: Widowed, lives with dtr. retired from [employer] and [employer])  . Alcohol Use: No  . Drug Use: No  . Sexual Activity: Not on file   Other Topics Concern  . Not on file   Social History Narrative  . No narrative on file    FAMILY HISTORY:   Family Status  Relation Status Death Age  . Father Deceased     renal failure  . Mother Deceased     unknown cause of death  . Sister Alive     unknown medical health  . Brother Alive     healthy  . Brother Deceased     2, one at birth w/ hydrocephalous  . Child Deceased     CA  . Child Alive     alive and well    ROS:  A complete 10 system review of systems was obtained and was unremarkable apart from what is mentioned above.  PHYSICAL EXAMINATION:    VITALS:   Filed Vitals:   05/31/13 1116  BP: 128/78  Pulse: 84  Temp: 97.4 F (36.3 C)  TempSrc: Oral  Resp: 20  Weight: 176 lb (79.833 kg)    GEN:  The patient appears stated age and is in NAD. HEENT:  Normocephalic, atraumatic.  The mucous membranes are moist. The superficial temporal arteries are without ropiness or tenderness. CV:  RRR Lungs:  CTAB Neck/HEME:  There are no carotid bruits bilaterally.  Neurological examination:  Orientation: A MoCA was done previously and the patient scored a 15/30.  She named the month properly, but stated that it was the 24th (it is the 27th) and did not know the year.  She scores 4/4 on clock drawing. Cranial nerves: There is good facial symmetry. Pupils are equal round and reactive to light bilaterally.  Fundoscopic exam is attempted but the disc margins are not well visualized bilaterally. Extraocular muscles are intact. The visual fields are full to confrontational testing. The speech is fluent and clear. Soft palate rises symmetrically and there is no tongue deviation. Hearing is decreased to conversational tone. Sensation: Sensation is intact to light and pinprick throughout (facial, trunk, extremities).  Vibration is intact at the bilateral ankle. There is no extinction with double simultaneous stimulation. There is no sensory dermatomal level  identified. Motor: Strength is 5/5 in the bilateral upper and lower extremities.   Shoulder shrug is equal and symmetric.  There is no pronator drift. Deep tendon reflexes: Deep tendon reflexes are 1/4 at the bilateral biceps, triceps, brachioradialis, and absent at the bilateral patella and achilles. Plantar responses are downgoing bilaterally.  Movement examination: Tone: There is normal tone in the bilateral upper extremities.  The tone in the lower extremities is normal.  Abnormal movements: There is virtually no tremor today.  She has minimal trouble with writing due to tremulousness. Coordination:  There is no decremation with RAM's in the hands or feet, tested with alternating supination/pronation, finger taps, hand opening and closing and heel and toe taps. Gait and Station: The patient has minimal difficulty arising out of a deep-seated chair without the use of the hands. The patient's stride length is normal and she has good arm swing.  There is mild postural instability.   LABS:   Lab Results  Component Value Date   VITAMINB12 288 07/22/2012      ASSESSMENT:   1.  Tremor.  I see no evidence of idiopathic PD or any of the parkinsonian syndromes.  Tremor may be due to prior brain injury from aneurysm and resulting encephalomalacia.  She is off of carbidopa/levodopa without changes from previous. 2.  Memory loss.  I suspect that this represents a dementia of the Alzheimers type but it could be vascular in nature. 3.  Mild B12 deficiency. 4.  Hx of cerebral aneurysm greater than 20 years ago.  PLAN:   1.  We discussed the diagnosis as well as pathophysiology of the disease.  We discussed treatment options as well as prognostic indicators.  Patient education was provided. 2.  she is doing well with the primidone, 50 mg twice per day .    Risks, benefits, side effects and alternative therapies were discussed.  The opportunity to ask questions was given and they were answered to the best of my ability.  The patient expressed understanding and willingness to follow the outlined treatment protocols. 3. Her next  Monthly B12 injection was given in the office today.  She will get one B12 injection weekly for the next month and then we will go to monthly. 4.  we will initiate Aricept, 5 mg daily. 5. A handicap sticker was given for the daughters car as it is not safe for the pts daughter to drop her off and then go park as the pt needs closer supervision. 6.  F/u in 6 months, sooner should new issues arise.

## 2013-05-31 NOTE — Patient Instructions (Addendum)
Schedule a B12 appointment for one month.  Follow up with Dr. Arbutus Leas in six months.

## 2013-06-01 ENCOUNTER — Encounter: Payer: Self-pay | Admitting: Internal Medicine

## 2013-06-19 ENCOUNTER — Ambulatory Visit (INDEPENDENT_AMBULATORY_CARE_PROVIDER_SITE_OTHER): Payer: Medicare Other | Admitting: *Deleted

## 2013-06-19 DIAGNOSIS — I472 Ventricular tachycardia: Secondary | ICD-10-CM

## 2013-06-19 DIAGNOSIS — I4729 Other ventricular tachycardia: Secondary | ICD-10-CM

## 2013-06-19 DIAGNOSIS — Z95810 Presence of automatic (implantable) cardiac defibrillator: Secondary | ICD-10-CM

## 2013-06-20 LAB — REMOTE ICD DEVICE
AL IMPEDENCE ICD: 360 Ohm
BAMS-0003: 70 {beats}/min
DEV-0020ICD: NEGATIVE
DEVICE MODEL ICD: 7016049
RV LEAD AMPLITUDE: 11.8 mv
RV LEAD IMPEDENCE ICD: 560 Ohm
TZON-0003SLOWVT: 350 ms
TZON-0004SLOWVT: 16
TZON-0010SLOWVT: 40 ms

## 2013-06-22 ENCOUNTER — Other Ambulatory Visit: Payer: Self-pay | Admitting: Cardiovascular Disease

## 2013-06-30 ENCOUNTER — Ambulatory Visit: Payer: Medicare Other

## 2013-06-30 ENCOUNTER — Other Ambulatory Visit (INDEPENDENT_AMBULATORY_CARE_PROVIDER_SITE_OTHER): Payer: Medicare Other

## 2013-06-30 ENCOUNTER — Encounter: Payer: Self-pay | Admitting: *Deleted

## 2013-06-30 ENCOUNTER — Other Ambulatory Visit: Payer: Medicare Other

## 2013-06-30 DIAGNOSIS — D518 Other vitamin B12 deficiency anemias: Secondary | ICD-10-CM

## 2013-06-30 DIAGNOSIS — E538 Deficiency of other specified B group vitamins: Secondary | ICD-10-CM

## 2013-06-30 MED ORDER — CYANOCOBALAMIN 1000 MCG/ML IJ SOLN
1000.0000 ug | Freq: Once | INTRAMUSCULAR | Status: AC
  Administered 2013-06-30: 1000 ug via INTRAMUSCULAR

## 2013-07-08 ENCOUNTER — Encounter: Payer: Self-pay | Admitting: Internal Medicine

## 2013-08-04 ENCOUNTER — Ambulatory Visit (INDEPENDENT_AMBULATORY_CARE_PROVIDER_SITE_OTHER): Payer: Medicare Other

## 2013-08-04 DIAGNOSIS — E538 Deficiency of other specified B group vitamins: Secondary | ICD-10-CM

## 2013-08-04 MED ORDER — CYANOCOBALAMIN 1000 MCG/ML IJ SOLN
1000.0000 ug | Freq: Once | INTRAMUSCULAR | Status: AC
  Administered 2013-08-04: 1000 ug via INTRAMUSCULAR

## 2013-08-11 ENCOUNTER — Ambulatory Visit (AMBULATORY_SURGERY_CENTER): Payer: Self-pay

## 2013-08-11 ENCOUNTER — Encounter: Payer: Self-pay | Admitting: Internal Medicine

## 2013-08-11 VITALS — Ht 65.0 in | Wt 176.0 lb

## 2013-08-11 DIAGNOSIS — Z860100 Personal history of colon polyps, unspecified: Secondary | ICD-10-CM

## 2013-08-11 MED ORDER — MOVIPREP 100 G PO SOLR: 1.0000 | Freq: Once | ORAL | Status: DC

## 2013-08-15 NOTE — Progress Notes (Signed)
Can't close this encounter

## 2013-08-23 ENCOUNTER — Other Ambulatory Visit: Payer: Self-pay

## 2013-08-23 ENCOUNTER — Other Ambulatory Visit: Payer: Self-pay | Admitting: *Deleted

## 2013-08-23 MED ORDER — PROPRANOLOL HCL ER 160 MG PO CP24: 160.0000 mg | ORAL_CAPSULE | Freq: Every day | ORAL | Status: DC

## 2013-08-23 MED ORDER — LEVOTHYROXINE SODIUM 150 MCG PO TABS: ORAL_TABLET | ORAL | Status: DC

## 2013-08-23 MED ORDER — DONEPEZIL HCL 5 MG PO TABS: 5.0000 mg | ORAL_TABLET | Freq: Every day | ORAL | Status: DC

## 2013-08-23 MED ORDER — POTASSIUM CHLORIDE CRYS ER 10 MEQ PO TBCR: EXTENDED_RELEASE_TABLET | ORAL | Status: DC

## 2013-08-24 ENCOUNTER — Other Ambulatory Visit: Payer: Self-pay

## 2013-08-24 ENCOUNTER — Ambulatory Visit (AMBULATORY_SURGERY_CENTER): Payer: Medicare Other | Admitting: Internal Medicine

## 2013-08-24 ENCOUNTER — Encounter: Payer: Self-pay | Admitting: Internal Medicine

## 2013-08-24 VITALS — BP 135/75 | HR 70 | Temp 97.1°F | Resp 13 | Ht 65.0 in | Wt 176.0 lb

## 2013-08-24 DIAGNOSIS — Z860100 Personal history of colon polyps, unspecified: Secondary | ICD-10-CM

## 2013-08-24 MED ORDER — SODIUM CHLORIDE 0.9 % IV SOLN: 500.0000 mL | INTRAVENOUS | Status: DC

## 2013-08-24 NOTE — Progress Notes (Signed)
Patient did not experience any of the following events: a burn prior to discharge; a fall within the facility; wrong site/side/patient/procedure/implant event; or a hospital transfer or hospital admission upon discharge from the facility. (G8907) Patient did not have preoperative order for IV antibiotic SSI prophylaxis. (G8918)  

## 2013-08-24 NOTE — Op Note (Addendum)
[facility] [address] [place], [address]   COLONOSCOPY PROCEDURE REPORT  PATIENT: Erika Rivera, Erika Rivera  MR#: [id number] BIRTHDATE: 1936-08-10 , 77  yrs. old GENDER: Female ENDOSCOPIST: Beverley Fiedler, MD PROCEDURE DATE:  08/24/2013 PROCEDURE:   Colonoscopy, surveillance First Screening Colonoscopy - Avg.  risk and is 50 yrs.  old or older - No.  Prior Negative Screening - Now for repeat screening. N/A  History of Adenoma - Now for follow-up colonoscopy & has been > or = to 3 yrs.  No.  It has been less than 3 yrs since last colonoscopy.  Medical reason.  Polyps Removed Today? No.  Recommend repeat exam, <10 yrs? Yes.  High risk (family or personal hx). ASA CLASS:   Class III INDICATIONS:Patient's personal history of adenomatous colon polyps, large adenoma removed piecemeal at IC valve 01/05/2013. MEDICATIONS: MAC sedation, administered by CRNA and propofol (Diprivan) 150mg  IV  DESCRIPTION OF PROCEDURE:   After the risks benefits and alternatives of the procedure were thoroughly explained, informed consent was obtained.  A digital rectal exam revealed no rectal mass.   The LB [id number]  endoscope was introduced through the anus and advanced to the terminal ileum which was intubated for a short distance. No adverse events experienced.   The quality of the prep was good, using MoviPrep  The instrument was then slowly withdrawn as the colon was fully examined.   COLON FINDINGS: The mucosa appeared normal in the terminal ileum. A normal appearing cecum, ileocecal valve, and appendiceal orifice were identified.  There was no evidence of residual polyp tissue at the site of previous large polypectomy (IC valve). The ascending, hepatic flexure, transverse, splenic flexure, descending, sigmoid colon and rectum appeared unremarkable.  No polyps or cancers were seen.  Retroflexed views revealed no abnormalities. The time to cecum=2 minutes 13 seconds.  Withdrawal  time=10 minutes 39 seconds. The scope was withdrawn and the procedure completed.  COMPLICATIONS: There were no complications.  ENDOSCOPIC IMPRESSION: 1.   Normal mucosa in the terminal ileum 2.   Normal colon; no evidence of residual polyp/adenomatous tissue at IC valve.  RECOMMENDATIONS: Repeat Colonoscopy in 3 years given history of large adenomatous polyp.   eSigned:  Beverley Fiedler, MD 08/24/2013 9:12 AM Revised: 08/24/2013 9:12 AM  cc: The Patient and Newt Lukes, MD

## 2013-08-24 NOTE — Progress Notes (Signed)
A/ox3 pleased with MAC, report to [employer]

## 2013-08-24 NOTE — Patient Instructions (Signed)
Normal colon exam today!! Repeat  Colonoscopy in 3 years. Resume current medications.  Call us with any questions or concerns. Thank you!  YOU HAD AN ENDOSCOPIC PROCEDURE TODAY AT THE [facility]: Refer to the procedure report that was given to you for any specific questions about what was found during the examination.  If the procedure report does not answer your questions, please call your gastroenterologist to clarify.  If you requested that your care partner not be given the details of your procedure findings, then the procedure report has been included in a sealed envelope for you to review at your convenience later.  YOU SHOULD EXPECT: Some feelings of bloating in the abdomen. Passage of more gas than usual.  Walking can help get rid of the air that was put into your GI tract during the procedure and reduce the bloating. If you had a lower endoscopy (such as a colonoscopy or flexible sigmoidoscopy) you may notice spotting of blood in your stool or on the toilet paper. If you underwent a bowel prep for your procedure, then you may not have a normal bowel movement for a few days.  DIET: Your first meal following the procedure should be a light meal and then it is ok to progress to your normal diet.  A half-sandwich or bowl of soup is an example of a good first meal.  Heavy or fried foods are harder to digest and may make you feel nauseous or bloated.  Likewise meals heavy in dairy and vegetables can cause extra gas to form and this can also increase the bloating.  Drink plenty of fluids but you should avoid alcoholic beverages for 24 hours.  ACTIVITY: Your care partner should take you home directly after the procedure.  You should plan to take it easy, moving slowly for the rest of the day.  You can resume normal activity the day after the procedure however you should NOT DRIVE or use heavy machinery for 24 hours (because of the sedation medicines used during the test).    SYMPTOMS TO  REPORT IMMEDIATELY: A gastroenterologist can be reached at any hour.  During normal business hours, 8:30 AM to 5:00 PM Monday through Friday, call [phone].  After hours and on weekends, please call the GI answering service at [phone] who will take a message and have the physician on call contact you.   Following lower endoscopy (colonoscopy or flexible sigmoidoscopy):  Excessive amounts of blood in the stool  Significant tenderness or worsening of abdominal pains  Swelling of the abdomen that is new, acute  Fever of 100F or higher  Following upper endoscopy (EGD)  Vomiting of blood or coffee ground material  New chest pain or pain under the shoulder blades  Painful or persistently difficult swallowing  New shortness of breath  Fever of 100F or higher  Black, tarry-looking stools  FOLLOW UP: If any biopsies were taken you will be contacted by phone or by letter within the next 1-3 weeks.  Call your gastroenterologist if you have not heard about the biopsies in 3 weeks.  Our staff will call the home number listed on your records the next business day following your procedure to check on you and address any questions or concerns that you may have at that time regarding the information given to you following your procedure. This is a courtesy call and so if there is no answer at the home number and we have not heard from you through the  emergency physician on call, we will assume that you have returned to your regular daily activities without incident.  SIGNATURES/CONFIDENTIALITY: You and/or your care partner have signed paperwork which will be entered into your electronic medical record.  These signatures attest to the fact that that the information above on your After Visit Summary has been reviewed and is understood.  Full responsibility of the confidentiality of this discharge information lies with you and/or your care-partner.

## 2013-08-25 ENCOUNTER — Telehealth: Payer: Self-pay

## 2013-08-25 ENCOUNTER — Other Ambulatory Visit: Payer: Self-pay

## 2013-08-25 ENCOUNTER — Other Ambulatory Visit: Payer: Self-pay | Admitting: *Deleted

## 2013-08-25 MED ORDER — SIMVASTATIN 20 MG PO TABS: ORAL_TABLET | ORAL | Status: DC

## 2013-08-25 MED ORDER — AMLODIPINE BESYLATE 10 MG PO TABS: ORAL_TABLET | ORAL | Status: DC

## 2013-08-25 NOTE — Telephone Encounter (Signed)
Left message on answering machine. 

## 2013-08-29 ENCOUNTER — Ambulatory Visit (INDEPENDENT_AMBULATORY_CARE_PROVIDER_SITE_OTHER): Payer: Medicare Other

## 2013-08-29 DIAGNOSIS — E538 Deficiency of other specified B group vitamins: Secondary | ICD-10-CM

## 2013-08-29 DIAGNOSIS — D518 Other vitamin B12 deficiency anemias: Secondary | ICD-10-CM

## 2013-08-29 MED ORDER — CYANOCOBALAMIN 1000 MCG/ML IJ SOLN
1000.0000 ug | Freq: Once | INTRAMUSCULAR | Status: AC
  Administered 2013-08-29: 1000 ug via INTRAMUSCULAR

## 2013-08-30 ENCOUNTER — Ambulatory Visit: Payer: Medicare Other

## 2013-09-06 ENCOUNTER — Ambulatory Visit: Payer: Medicare Other

## 2013-09-18 ENCOUNTER — Ambulatory Visit (INDEPENDENT_AMBULATORY_CARE_PROVIDER_SITE_OTHER): Payer: Medicare Other | Admitting: *Deleted

## 2013-09-18 ENCOUNTER — Encounter: Payer: Self-pay | Admitting: Internal Medicine

## 2013-09-18 DIAGNOSIS — I472 Ventricular tachycardia: Secondary | ICD-10-CM

## 2013-09-18 LAB — MDC_IDC_ENUM_SESS_TYPE_REMOTE
Battery Remaining Longevity: 64 mo
Battery Remaining Percentage: 75 %
Brady Statistic AP VS Percent: 57 %
Brady Statistic AS VP Percent: 1 %
Brady Statistic AS VS Percent: 2.4 %
Brady Statistic RA Percent Paced: 91 %
HighPow Impedance: 75 Ohm
HighPow Impedance: 75 Ohm
Implantable Pulse Generator Serial Number: 7016049
Lead Channel Impedance Value: 350 Ohm
Lead Channel Pacing Threshold Amplitude: 0.75 V
Lead Channel Pacing Threshold Pulse Width: 0.5 ms
Lead Channel Sensing Intrinsic Amplitude: 1.8 mV
Lead Channel Sensing Intrinsic Amplitude: 11.8 mV
Lead Channel Setting Pacing Amplitude: 2 V
Lead Channel Setting Pacing Pulse Width: 0.5 ms
Lead Channel Setting Sensing Sensitivity: 0.5 mV
Zone Setting Detection Interval: 320 ms

## 2013-09-22 ENCOUNTER — Ambulatory Visit (INDEPENDENT_AMBULATORY_CARE_PROVIDER_SITE_OTHER): Payer: Medicare Other | Admitting: Internal Medicine

## 2013-09-22 ENCOUNTER — Other Ambulatory Visit: Payer: Self-pay | Admitting: Internal Medicine

## 2013-09-22 ENCOUNTER — Other Ambulatory Visit (INDEPENDENT_AMBULATORY_CARE_PROVIDER_SITE_OTHER): Payer: Medicare Other

## 2013-09-22 ENCOUNTER — Encounter: Payer: Self-pay | Admitting: Internal Medicine

## 2013-09-22 VITALS — BP 120/72 | HR 76 | Temp 98.6°F | Wt 168.0 lb

## 2013-09-22 DIAGNOSIS — E785 Hyperlipidemia, unspecified: Secondary | ICD-10-CM

## 2013-09-22 DIAGNOSIS — E039 Hypothyroidism, unspecified: Secondary | ICD-10-CM

## 2013-09-22 DIAGNOSIS — Z0000 Encounter for general adult medical examination without abnormal findings: Secondary | ICD-10-CM

## 2013-09-22 DIAGNOSIS — R809 Proteinuria, unspecified: Secondary | ICD-10-CM

## 2013-09-22 DIAGNOSIS — Z23 Encounter for immunization: Secondary | ICD-10-CM

## 2013-09-22 DIAGNOSIS — I10 Essential (primary) hypertension: Secondary | ICD-10-CM

## 2013-09-22 DIAGNOSIS — N183 Chronic kidney disease, stage 3 unspecified: Secondary | ICD-10-CM

## 2013-09-22 LAB — CBC WITH DIFFERENTIAL/PLATELET
Basophils Absolute: 0 10*3/uL (ref 0.0–0.1)
Basophils Relative: 0.5 % (ref 0.0–3.0)
Eosinophils Absolute: 0.3 10*3/uL (ref 0.0–0.7)
Eosinophils Relative: 4 % (ref 0.0–5.0)
HCT: 32.2 % — ABNORMAL LOW (ref 36.0–46.0)
Hemoglobin: 10.6 g/dL — ABNORMAL LOW (ref 12.0–15.0)
Lymphocytes Relative: 34 % (ref 12.0–46.0)
Lymphs Abs: 2.2 10*3/uL (ref 0.7–4.0)
MCHC: 32.7 g/dL (ref 30.0–36.0)
MCV: 90.8 fl (ref 78.0–100.0)
Monocytes Absolute: 0.6 10*3/uL (ref 0.1–1.0)
Neutro Abs: 3.4 10*3/uL (ref 1.4–7.7)
RBC: 3.55 Mil/uL — ABNORMAL LOW (ref 3.87–5.11)
WBC: 6.6 10*3/uL (ref 4.5–10.5)

## 2013-09-22 LAB — BASIC METABOLIC PANEL
BUN: 17 mg/dL (ref 6–23)
CO2: 27 mEq/L (ref 19–32)
Chloride: 109 mEq/L (ref 96–112)
Creatinine, Ser: 1.6 mg/dL — ABNORMAL HIGH (ref 0.4–1.2)
Potassium: 4.2 mEq/L (ref 3.5–5.1)
Sodium: 141 mEq/L (ref 135–145)

## 2013-09-22 LAB — TSH: TSH: 0.05 u[IU]/mL — ABNORMAL LOW (ref 0.35–5.50)

## 2013-09-22 LAB — URINALYSIS, ROUTINE W REFLEX MICROSCOPIC
Bilirubin Urine: NEGATIVE
Ketones, ur: NEGATIVE
Total Protein, Urine: 100 — AB
Urine Glucose: NEGATIVE
Urobilinogen, UA: 0.2 (ref 0.0–1.0)

## 2013-09-22 LAB — HEPATIC FUNCTION PANEL
Albumin: 3.7 g/dL (ref 3.5–5.2)
Alkaline Phosphatase: 66 U/L (ref 39–117)
Total Protein: 7.4 g/dL (ref 6.0–8.3)

## 2013-09-22 LAB — LIPID PANEL
Cholesterol: 126 mg/dL (ref 0–200)
HDL: 53.1 mg/dL (ref >39.00)
Triglycerides: 122 mg/dL (ref 0.0–149.0)
VLDL: 24.4 mg/dL (ref 0.0–40.0)

## 2013-09-22 LAB — MICROALBUMIN / CREATININE URINE RATIO
Creatinine,U: 144.7 mg/dL
Microalb Creat Ratio: 44.6 mg/g — ABNORMAL HIGH (ref 0.0–30.0)
Microalb, Ur: 64.5 mg/dL — ABNORMAL HIGH (ref 0.0–1.9)

## 2013-09-22 MED ORDER — TRIAMCINOLONE ACETONIDE 0.1 % EX CREA: 1.0000 "application " | TOPICAL_CREAM | Freq: Two times a day (BID) | CUTANEOUS | Status: DC

## 2013-09-22 NOTE — Assessment & Plan Note (Signed)
Hx CVA - prior Noncompliance with statin summer 2012 Improved compliance since - Check annually The current medical regimen is effective;  continue present plan and medications. 

## 2013-09-22 NOTE — Progress Notes (Signed)
Subjective:    Patient ID: Erika Rivera, female    DOB: [birth year], 77 y.o.   MRN: [id number]  HPI  Here for medicare wellness  Diet: heart healthy  Physical activity: sedentary Depression/mood screen: negative Hearing: wears hearing aides Visual acuity: grossly normal, performs annual eye exam  ADLs: capable Fall risk: none Home safety: good Cognitive evaluation: intact to orientation, naming, recall and repetition EOL planning: adv directives, full code/ I agree  I have personally reviewed and have noted 1. The patient's medical and social history 2. Their use of alcohol, tobacco or illicit drugs 3. Their current medications and supplements 4. The patient's functional ability including ADL's, fall risks, home safety risks and hearing or visual impairment. 5. Diet and physical activities 6. Evidence for depression or mood disorders   Also reviewed chronic medical issues  Hx syncope events: Seen in ER 10/02/11 for syncope, standing on porch and "went out", hit head on back of wall -  no seizure or chest pain, momentary loss of consciousness  - dx UTI on Er review 2nd syncope event 10/20/11 at home upon getting out of bed and going to bathroom - "head got fuzzy and i saw myself going down" - dtr witnessed both events - no incontinence or confusion 2 more events 11/2011 prompting return to ER> dx brady and PMVT S/p LHC, EP, echo> ICD placed 12/2011 - follows with EP for same  dyslipidemia - on statin since fall 2010 -the patient reports compliance with medication(s) as prescribed. Denies adverse side effects.  hypothyroid - no longer working with endo on same - hx variable compliance -denies adverse side effects related to current therapy.   arthiritis - reports compliance with ongoing medical treatment and no changes in medication dose or frequency.  denies adverse side effects related to current therapy.   hypertension - reports compliance with ongoing medical treatment  and no changes in medication dose or frequency. denies adverse side effects related to current therapy. no chest pain, edema or shortness of breath    Essential tremor - working with neuro - started primidone 09/2012-the patient reports compliance with medication(s) as prescribed. Denies adverse side effects.   Past Medical History  Diagnosis Date  . ARTHRITIS, HAND   . ARTHRITIS, LEFT SHOULDER   . HEARING LOSS   . SMOKER   . CEREBROVASCULAR ACCIDENT, HX OF   . HYPERTENSION   . HYPERLIPIDEMIA   . HYPOTHYROIDISM   . OVERACTIVE BLADDER   . Cerebral aneurysm     s/p clipping  . CAD (coronary artery disease)     LHC 12/2011: RCA 75% (neg FFR), EF 55-65% - med Tx  . Polymorphic ventricular tachycardia     s/p St Jude AICD (dual chamber) 12/2011;  Echo: mild LVH, EF 50-555, grade 1 diast dysfxn, trivial AI.   Marland Kitchen Torsade de pointes     PVC dependent  . Syncope     2/2 VTach  . Bradycardia     A paced by AICD  . Essential tremor   . Osteopenia 11/18/2012    10/14/12 DEXA @ [facility]: -2.2  . Allergy   . Arthritis    Family History  Problem Relation Age of Onset  . Gout Father   . Arthritis Other   . Hypertension Other   . Kidney disease Other   . Heart disease Other   . Thyroid disease Other   . Colon polyps Neg Hx   . Esophageal cancer Neg Hx   . Rectal cancer  Neg Hx   . Stomach cancer Neg Hx    History  Substance Use Topics  . Smoking status: Former Smoker -- 0.60 packs/day    Quit date: 11/28/2011  . Smokeless tobacco: Never Used     Comment: Widowed, lives with dtr. retired from [employer] (new[employer])  . Alcohol Use: No    Review of Systems  Constitutional: Negative for fever and unexpected weight change.  Respiratory: Negative for cough, shortness of breath and wheezing.   Cardiovascular: Negative for chest pain and palpitations.  Neurological: Negative for dizziness, facial asymmetry, speech difficulty, weakness, light-headedness and headaches.  Hematological:  Does not bruise/bleed easily.  Psychiatric/Behavioral: Negative for behavioral problems and dysphoric mood. The patient is not nervous/anxious.   All other systems reviewed and are negative.       Objective:   Physical Exam BP 120/72  Pulse 76  Temp(Src) 98.6 F (37 C) (Oral)  Wt 168 lb (76.204 kg)  SpO2 96% Wt Readings from Last 3 Encounters:  09/22/13 168 lb (76.204 kg)  08/24/13 176 lb (79.833 kg)  08/11/13 176 lb (79.833 kg)   Constitutional: She appears well-developed and well-nourished. No distress. dtr Cherly Hensen) at side - chronically HOH with hearing aides in today Neck: Normal range of motion. Neck supple. No JVD present. No thyromegaly present.  Cardiovascular: Normal rate, regular rhythm and normal heart sounds.  No murmur heard. No BLE edema. device in L anterior wall without swelling, bruising or erythema around pocket.  Pulmonary/Chest: Effort normal and breath sounds normal. No respiratory distress. She has no wheezes.  Neuro: AAOx3, CN2-12 symmetrically intact - speech fluent, follows 3 step commands and MAE well - gait normal, unaided - nearly undetectable tremor in R hand Skin: extremely dry, scaly skin bilateral lower extremities, most notable medial side left ankle Psychiatric: She has a normal mood and affect. Her behavior is normal. Judgment and thought content normal.    Lab Results  Component Value Date   WBC 5.8 12/07/2011   HGB 11.8* 12/07/2011   HCT 36.9 12/07/2011   PLT 247 12/07/2011   CHOL 120 09/22/2012   TRIG 71.0 09/22/2012   HDL 40.80 09/22/2012   LDLDIRECT 138.6 04/23/2011   ALT 10 04/23/2011   AST 25 04/23/2011   NA 138 03/31/2013   K 4.5 03/31/2013   CL 106 03/31/2013   CREATININE 1.3* 03/31/2013   BUN 15 03/31/2013   CO2 24 03/31/2013   TSH 0.04* 03/31/2013   INR 1.03 12/07/2011   HGBA1C 5.1 09/22/2012        Assessment & Plan:   CPX/AWV/v70.0 - Today patient counseled on age appropriate routine health concerns for screening and prevention,  each reviewed and up to date or declined. Immunizations reviewed and up to date or declined. Labs ordered and reviewed. Risk factors for depression reviewed and negative. Hearing function and visual acuity are intact. ADLs screened and addressed as needed. Functional ability and level of safety reviewed and appropriate. Education, counseling and referrals performed based on assessed risks today. Patient provided with a copy of personalized plan for preventive services.  Also see problem list. Medications and labs reviewed today.

## 2013-09-22 NOTE — Patient Instructions (Addendum)
It was good to see you today.  We have reviewed your prior records including labs and tests today  Health Maintenance reviewed - all recommended immunizations and age-appropriate screenings are up-to-date. Your annual flu shot was given and/or updated today.  Test(s) ordered today. Your results will be released to MyChart (or called to you) after review, usually within 72hours after test completion. If any changes need to be made, you will be notified at that same time.  Medications reviewed and updated, no changes recommended at this time.  Please schedule followup in 6 months, call sooner if problems.  Health Maintenance, Female A healthy lifestyle and preventative care can promote health and wellness.  Maintain regular health, dental, and eye exams.  Eat a healthy diet. Foods like vegetables, fruits, whole grains, low-fat dairy products, and lean protein foods contain the nutrients you need without too many calories. Decrease your intake of foods high in solid fats, added sugars, and salt. Get information about a proper diet from your caregiver, if necessary.  Regular physical exercise is one of the most important things you can do for your health. Most adults should get at least 150 minutes of moderate-intensity exercise (any activity that increases your heart rate and causes you to sweat) each week. In addition, most adults need muscle-strengthening exercises on 2 or more days a week.   Maintain a healthy weight. The body mass index (BMI) is a screening tool to identify possible weight problems. It provides an estimate of body fat based on height and weight. Your caregiver can help determine your BMI, and can help you achieve or maintain a healthy weight. For adults 20 years and older:  A BMI below 18.5 is considered underweight.  A BMI of 18.5 to 24.9 is normal.  A BMI of 25 to 29.9 is considered overweight.  A BMI of 30 and above is considered obese.  Maintain normal blood  lipids and cholesterol by exercising and minimizing your intake of saturated fat. Eat a balanced diet with plenty of fruits and vegetables. Blood tests for lipids and cholesterol should begin at age 75 and be repeated every 5 years. If your lipid or cholesterol levels are high, you are over 50, or you are a high risk for heart disease, you may need your cholesterol levels checked more frequently.Ongoing high lipid and cholesterol levels should be treated with medicines if diet and exercise are not effective.  If you smoke, find out from your caregiver how to quit. If you do not use tobacco, do not start.  Lung cancer screening is recommended for adults aged 81 80 years who are at high risk for developing lung cancer because of a history of smoking. Yearly low-dose computed tomography (CT) is recommended for people who have at least a 30-pack-year history of smoking and are a current smoker or have quit within the past 15 years. A pack year of smoking is smoking an average of 1 pack of cigarettes a day for 1 year (for example: 1 pack a day for 30 years or 2 packs a day for 15 years). Yearly screening should continue until the smoker has stopped smoking for at least 15 years. Yearly screening should also be stopped for people who develop a health problem that would prevent them from having lung cancer treatment.  If you are pregnant, do not drink alcohol. If you are breastfeeding, be very cautious about drinking alcohol. If you are not pregnant and choose to drink alcohol, do not exceed 1 drink  per day. One drink is considered to be 12 ounces (355 mL) of beer, 5 ounces (148 mL) of wine, or 1.5 ounces (44 mL) of liquor.  Avoid use of street drugs. Do not share needles with anyone. Ask for help if you need support or instructions about stopping the use of drugs.  High blood pressure causes heart disease and increases the risk of stroke. Blood pressure should be checked at least every 1 to 2 years. Ongoing  high blood pressure should be treated with medicines, if weight loss and exercise are not effective.  If you are 73 to 75 years old, ask your caregiver if you should take aspirin to prevent strokes.  Diabetes screening involves taking a blood sample to check your fasting blood sugar level. This should be done once every 3 years, after age 63, if you are within normal weight and without risk factors for diabetes. Testing should be considered at a younger age or be carried out more frequently if you are overweight and have at least 1 risk factor for diabetes.  Breast cancer screening is essential preventative care for women. You should practice "breast self-awareness." This means understanding the normal appearance and feel of your breasts and may include breast self-examination. Any changes detected, no matter how small, should be reported to a caregiver. Women in their 77s and 30s should have a clinical breast exam (CBE) by a caregiver as part of a regular health exam every 1 to 3 years. After age 68, women should have a CBE every year. Starting at age 79, women should consider having a mammogram (breast X-ray) every year. Women who have a family history of breast cancer should talk to their caregiver about genetic screening. Women at a high risk of breast cancer should talk to their caregiver about having an MRI and a mammogram every year.  Breast cancer gene (BRCA)-related cancer risk assessment is recommended for women who have family members with BRCA-related cancers. BRCA-related cancers include breast, ovarian, tubal, and peritoneal cancers. Having family members with these cancers may be associated with an increased risk for harmful changes (mutations) in the breast cancer genes BRCA1 and BRCA2. Results of the assessment will determine the need for genetic counseling and BRCA1 and BRCA2 testing.  The Pap test is a screening test for cervical cancer. Women should have a Pap test starting at age 59.  Between ages 57 and 40, Pap tests should be repeated every 2 years. Beginning at age 41, you should have a Pap test every 3 years as long as the past 3 Pap tests have been normal. If you had a hysterectomy for a problem that was not cancer or a condition that could lead to cancer, then you no longer need Pap tests. If you are between ages 68 and 24, and you have had normal Pap tests going back 10 years, you no longer need Pap tests. If you have had past treatment for cervical cancer or a condition that could lead to cancer, you need Pap tests and screening for cancer for at least 20 years after your treatment. If Pap tests have been discontinued, risk factors (such as a new sexual partner) need to be reassessed to determine if screening should be resumed. Some women have medical problems that increase the chance of getting cervical cancer. In these cases, your caregiver may recommend more frequent screening and Pap tests.  The human papillomavirus (HPV) test is an additional test that may be used for cervical cancer screening. The  HPV test looks for the virus that can cause the cell changes on the cervix. The cells collected during the Pap test can be tested for HPV. The HPV test could be used to screen women aged 80 years and older, and should be used in women of any age who have unclear Pap test results. After the age of 5, women should have HPV testing at the same frequency as a Pap test.  Colorectal cancer can be detected and often prevented. Most routine colorectal cancer screening begins at the age of 37 and continues through age 39. However, your caregiver may recommend screening at an earlier age if you have risk factors for colon cancer. On a yearly basis, your caregiver may provide home test kits to check for hidden blood in the stool. Use of a small camera at the end of a tube, to directly examine the colon (sigmoidoscopy or colonoscopy), can detect the earliest forms of colorectal cancer. Talk to  your caregiver about this at age 50, when routine screening begins. Direct examination of the colon should be repeated every 5 to 10 years through age 37, unless early forms of pre-cancerous polyps or small growths are found.  Hepatitis C blood testing is recommended for all people born from [birth year] through 1963 and any individual with known risks for hepatitis C.  Practice safe sex. Use condoms and avoid high-risk sexual practices to reduce the spread of sexually transmitted infections (STIs). Sexually active women aged 87 and younger should be checked for Chlamydia, which is a common sexually transmitted infection. Older women with new or multiple partners should also be tested for Chlamydia. Testing for other STIs is recommended if you are sexually active and at increased risk.  Osteoporosis is a disease in which the bones lose minerals and strength with aging. This can result in serious bone fractures. The risk of osteoporosis can be identified using a bone density scan. Women ages 82 and over and women at risk for fractures or osteoporosis should discuss screening with their caregivers. Ask your caregiver whether you should be taking a calcium supplement or vitamin D to reduce the rate of osteoporosis.  Menopause can be associated with physical symptoms and risks. Hormone replacement therapy is available to decrease symptoms and risks. You should talk to your caregiver about whether hormone replacement therapy is right for you.  Use sunscreen. Apply sunscreen liberally and repeatedly throughout the day. You should seek shade when your shadow is shorter than you. Protect yourself by wearing long sleeves, pants, a wide-brimmed hat, and sunglasses year round, whenever you are outdoors.  Notify your caregiver of new moles or changes in moles, especially if there is a change in shape or color. Also notify your caregiver if a mole is larger than the size of a pencil eraser.  Stay current with your  immunizations. Document Released: 04/06/2011 Document Revised: 01/16/2013 Document Reviewed: 04/06/2011 [facility] Patient Information 2014 [place], [place]. Hypertension Hypertension is another name for high blood pressure. High blood pressure may mean that your heart needs to work harder to pump blood. Blood pressure consists of two numbers, which includes a higher number over a lower number (example: 110/72). HOME CARE   Make lifestyle changes as told by your doctor. This may include weight loss and exercise.  Take your blood pressure medicine every day.  Limit how much salt you use.  Stop smoking if you smoke.  Do not use drugs.  Talk to your doctor if you are using decongestants or birth  control pills. These medicines might make blood pressure higher.  Females should not drink more than 1 alcoholic drink per day. Males should not drink more than 2 alcoholic drinks per day.  See your doctor as told. GET HELP RIGHT AWAY IF:   You have a blood pressure reading with a top number of 180 or higher.  You get a very bad headache.  You get blurred or changing vision.  You feel confused.  You feel weak, numb, or faint.  You get chest or belly (abdominal) pain.  You throw up (vomit).  You cannot breathe very well. MAKE SURE YOU:   Understand these instructions.  Will watch your condition.  Will get help right away if you are not doing well or get worse. Document Released: 03/09/2008 Document Revised: 12/14/2011 Document Reviewed: 03/09/2008 [facility] Patient Information 2014 [place], [place].

## 2013-09-22 NOTE — Assessment & Plan Note (Signed)
BP Readings from Last 3 Encounters:  09/22/13 120/72  08/24/13 135/75  05/31/13 128/78   Added Bbloc to ARB/hct 04/2011 for BP control and RUE tremor symptoms  Amlodipine added by cards 03/2012 Encouraged compliance -no changes at this time Check Cr and microalb - ?progressive mild CKD

## 2013-09-22 NOTE — Progress Notes (Signed)
Pre-visit discussion using our clinic review tool. No additional management support is needed unless otherwise documented below in the visit note.  

## 2013-09-22 NOTE — Assessment & Plan Note (Signed)
hx noncompliance with meds summer 2012 Started working with endo for same fall 2012 - reports now taking meds and stabilized 12/2011 Check today - adjust if needed  Lab Results  Component Value Date   TSH 0.04* 03/31/2013

## 2013-09-23 DIAGNOSIS — R809 Proteinuria, unspecified: Secondary | ICD-10-CM | POA: Insufficient documentation

## 2013-09-23 DIAGNOSIS — I120 Hypertensive chronic kidney disease with stage 5 chronic kidney disease or end stage renal disease: Secondary | ICD-10-CM | POA: Insufficient documentation

## 2013-09-23 MED ORDER — LEVOTHYROXINE SODIUM 125 MCG PO TABS: 125.0000 ug | ORAL_TABLET | Freq: Every day | ORAL | Status: DC

## 2013-09-23 NOTE — Addendum Note (Signed)
Addended by: Rene Paci A on: 09/23/2013 09:16 AM   Modules accepted: Orders

## 2013-09-25 ENCOUNTER — Other Ambulatory Visit: Payer: Self-pay | Admitting: *Deleted

## 2013-09-25 MED ORDER — SIMVASTATIN 20 MG PO TABS: ORAL_TABLET | ORAL | Status: DC

## 2013-09-25 MED ORDER — PRIMIDONE 50 MG PO TABS: 50.0000 mg | ORAL_TABLET | Freq: Two times a day (BID) | ORAL | Status: DC

## 2013-09-25 MED ORDER — PROPRANOLOL HCL ER 160 MG PO CP24: 160.0000 mg | ORAL_CAPSULE | Freq: Every day | ORAL | Status: DC

## 2013-09-25 MED ORDER — LEVOTHYROXINE SODIUM 125 MCG PO TABS: 125.0000 ug | ORAL_TABLET | Freq: Every day | ORAL | Status: DC

## 2013-09-25 NOTE — Telephone Encounter (Signed)
Notified pt with lab results. Wanting 90 day sent on her propranolol, primidone and simvastatin...Raechel Chute

## 2013-10-06 ENCOUNTER — Ambulatory Visit (INDEPENDENT_AMBULATORY_CARE_PROVIDER_SITE_OTHER): Payer: Medicare Other

## 2013-10-06 DIAGNOSIS — E538 Deficiency of other specified B group vitamins: Secondary | ICD-10-CM

## 2013-10-06 DIAGNOSIS — D518 Other vitamin B12 deficiency anemias: Secondary | ICD-10-CM

## 2013-10-06 MED ORDER — CYANOCOBALAMIN 1000 MCG/ML IJ SOLN
1000.0000 ug | Freq: Once | INTRAMUSCULAR | Status: AC
  Administered 2013-10-06: 1000 ug via INTRAMUSCULAR

## 2013-10-12 ENCOUNTER — Encounter: Payer: Self-pay | Admitting: *Deleted

## 2013-10-20 ENCOUNTER — Telehealth: Payer: Self-pay | Admitting: Internal Medicine

## 2013-10-20 ENCOUNTER — Telehealth: Payer: Self-pay | Admitting: *Deleted

## 2013-10-20 NOTE — Telephone Encounter (Signed)
Patient Information:  Caller Name: [name]  Phone: [phone]  Patient: Erika Rivera, Erika Rivera  Gender: Female  DOB: Feb 22, 1936  Age: 78 Years  PCP: Gwendolyn Grant (Adults only)  Office Follow Up:  Does the office need to follow up with this patient?: Yes  Instructions For The Office: Please follow up with daughter regarding cough syrup - please see note below.   Symptoms  Reason For Call & Symptoms: 10/18/13 cough with yellow mucous, runny nose.   10/20/13 cough continues, runny nose, afebrile.  Eating/drinking well, urinating normally.  Using Halls cough drops.  Daughter is calling requesting what cough syrups pt can take with her medications.   Compared Dextromethorphan with pt's list of medications - there is a contraindication with Mysoline due to increased drowisiness/sedation.   Please discuss with Dr Asa Lente regarding if Dextromethorphan (Delsym) would be ok for pt or another recommendation for a cough syrup.  Reviewed Health History In EMR: Yes  Reviewed Medications In EMR: Yes  Reviewed Allergies In EMR: Yes  Reviewed Surgeries / Procedures: Yes  Date of Onset of Symptoms: 10/18/2013  Guideline(s) Used:  Cough  Disposition Per Guideline:   Home Care  Reason For Disposition Reached:   Cough with cold symptoms (e.g., runny nose, postnasal drip, throat clearing)  Advice Given:  Reassurance  Coughing is the way that our lungs remove irritants and mucus. It helps protect our lungs from getting pneumonia.  Cough Medicines:  OTC Cough Drops: Cough drops can help a lot, especially for mild coughs. They reduce coughing by soothing your irritated throat and removing that tickle sensation in the back of the throat. Cough drops also have the advantage of portability - you can carry them with you.  Home Remedy - Hard Candy: Hard candy works just as well as medicine-flavored OTC cough drops. Diabetics should use sugar-free candy.  Home Remedy - Honey: This old home remedy has been  shown to help decrease coughing at night. The adult dosage is 2 teaspoons (10 ml) at bedtime. Honey should not be given to infants under one year of age.  Coughing Spasms:  Drink warm fluids. Inhale warm mist (Reason: both relax the airway and loosen up the phlegm).  Suck on cough drops or hard candy to coat the irritated throat.  Prevent Dehydration:  Drink adequate liquids.  This will help soothe an irritated or dry throat and loosen up the phlegm.  Expected Course:   Viral bronchitis (chest cold) causes a cough that lasts 1 to 3 weeks. Sometimes you may cough up lots of phlegm (sputum, mucus). The mucus can normally be white, gray, yellow, or green.  Call Back If:  Difficulty breathing  Cough lasts more than 3 weeks  You become worse.  Patient Will Follow Care Advice:  YES

## 2013-10-20 NOTE — Telephone Encounter (Signed)
Daughter phoned for MD's recommendations from previous phone call.  Read to her notes from Erika Rivera 10/20/13 at 1051. Daughter verbalized understanding.

## 2013-10-27 ENCOUNTER — Encounter: Payer: Self-pay | Admitting: Internal Medicine

## 2013-11-10 ENCOUNTER — Ambulatory Visit (INDEPENDENT_AMBULATORY_CARE_PROVIDER_SITE_OTHER): Payer: Medicare Other | Admitting: Neurology

## 2013-11-10 ENCOUNTER — Encounter: Payer: Self-pay | Admitting: Neurology

## 2013-11-10 ENCOUNTER — Ambulatory Visit: Payer: Medicare Other

## 2013-11-10 ENCOUNTER — Ambulatory Visit: Payer: Medicare Other | Admitting: Neurology

## 2013-11-10 VITALS — BP 120/76 | HR 76 | Resp 16 | Ht 64.0 in | Wt 165.3 lb

## 2013-11-10 DIAGNOSIS — G309 Alzheimer's disease, unspecified: Principal | ICD-10-CM

## 2013-11-10 DIAGNOSIS — D518 Other vitamin B12 deficiency anemias: Secondary | ICD-10-CM

## 2013-11-10 DIAGNOSIS — F0280 Dementia in other diseases classified elsewhere without behavioral disturbance: Secondary | ICD-10-CM

## 2013-11-10 DIAGNOSIS — E538 Deficiency of other specified B group vitamins: Secondary | ICD-10-CM

## 2013-11-10 DIAGNOSIS — R259 Unspecified abnormal involuntary movements: Secondary | ICD-10-CM

## 2013-11-10 MED ORDER — DONEPEZIL HCL 10 MG PO TABS: 10.0000 mg | ORAL_TABLET | Freq: Every day | ORAL | Status: DC

## 2013-11-10 MED ORDER — CYANOCOBALAMIN 1000 MCG/ML IJ SOLN
1000.0000 ug | Freq: Once | INTRAMUSCULAR | Status: AC
  Administered 2013-11-10: 1000 ug via INTRAMUSCULAR

## 2013-11-10 NOTE — Progress Notes (Signed)
B12 injection given in right deltoid with no complications.

## 2013-11-10 NOTE — Patient Instructions (Addendum)
1.  Decrease primidone to one tablet daily 2.  Increase aricept (donepezil) to 10 mg daily 3. Follow up in 6 months 4. Continue monthly B12 injections

## 2013-11-10 NOTE — Progress Notes (Signed)
Erika Rivera was seen today in the movement disorders clinic for neurologic f/u for tremor and memory loss.   This patient is accompanied in the office by her child who supplements the history.   I weaned her off the L-dopa because I did not think that she was parkinsonian.  She has noticed no difference off of the medication compared to when she was on the medication.  She continues to have tremor that is most bothersome when she goes to pour something or when she eats.  She remains on propranolol 160 mg daily.  She was started on primidone last visit.  She is having no SE but she doesn't think it has helped.  05/31/13 update:    The patient is accompanied by her daughter who supplements the history.  Her memory is about the same.  Sometimes, her daughter states that she will revert back to "her childhood." She is not exercising.  She sits and watches TV all day and may do some "puzzles."  She will sometimes with water on. Her daughter does the cooking and driving.  Her daughter does state that she has trouble walking long distances and would like to see if they can get a handicap sticker.  The patient herself does not drive.  Last visit, her primidone was increased for tremor.  Her tremor has been markedly improved.  She does have a history of B12 deficiency and has been getting B12 injections faithfully.  11/10/13:  This patient is accompanied in the office by her child who supplements the history.  The patient has a history of dementia.  Memory has been about the same.  She is on Aricept, 5 mg which was started last August.  She is tolerating it well.  She continues to receive monthly B12 injections.  She continues to have some degree of tremor, but her daughter states that she rarely complains about it.  Today in the office, however, she complains that she has difficulty doing her crossword puzzles because of it.  Her daughter states that she does not notice any limitations because of the  tremor.  There is no fam hx of tremor.   PREVIOUS MEDICATIONS: Sinemet  ALLERGIES:   Allergies  Allergen Reactions  . Diphenhydramine Hcl     unsure  . Penicillins Rash    CURRENT MEDICATIONS:  Current Outpatient Prescriptions on File Prior to Visit  Medication Sig Dispense Refill  . amLODipine (NORVASC) 10 MG tablet TAKE 1 TABLET (10 MG TOTAL) BY MOUTH DAILY.  90 tablet  2  . BENICAR 40 MG tablet TAKE 1 TABLET BY MOUTH EVERY DAY  90 tablet  3  . Calcium Carbonate-Vitamin D (CALCIUM + D PO) Take by mouth.      . Cholecalciferol (VITAMIN D3) 5000 UNITS TABS Take 5,000 Units by mouth daily.  30 tablet    . levothyroxine (SYNTHROID, LEVOTHROID) 125 MCG tablet Take 1 tablet (125 mcg total) by mouth daily before breakfast.  90 tablet  1  . potassium chloride (KLOR-CON M10) 10 MEQ tablet TAKE 1 TABLET BY MOUTH ONLY ON MON, WED, AND FRI.  90 tablet  2  . primidone (MYSOLINE) 50 MG tablet Take 1 tablet (50 mg total) by mouth 2 (two) times daily.  180 tablet  1  . propranolol ER (INDERAL LA) 160 MG SR capsule Take 1 capsule (160 mg total) by mouth daily.  90 capsule  1  . simvastatin (ZOCOR) 20 MG tablet 1 BY MOUTH AT BEDTIME  90  tablet  1  . triamcinolone cream (KENALOG) 0.1 % Apply 1 application topically 2 (two) times daily.  45 g  0   No current facility-administered medications on file prior to visit.    PAST MEDICAL HISTORY:   Past Medical History  Diagnosis Date  . ARTHRITIS, HAND   . ARTHRITIS, LEFT SHOULDER   . HEARING LOSS   . SMOKER   . CEREBROVASCULAR ACCIDENT, HX OF   . HYPERTENSION   . HYPERLIPIDEMIA   . HYPOTHYROIDISM   . OVERACTIVE BLADDER   . Cerebral aneurysm     s/p clipping  . CAD (coronary artery disease)     LHC 12/2011: RCA 75% (neg FFR), EF 55-65% - med Tx  . Polymorphic ventricular tachycardia     s/p St Jude AICD (dual chamber) 12/2011;  Echo: mild LVH, EF 50-555, grade 1 diast dysfxn, trivial AI.   Marland Kitchen. Torsade de pointes     PVC dependent  . Syncope      2/2 VTach  . Bradycardia     A paced by AICD  . Essential tremor   . Osteopenia 11/18/2012    10/14/12 DEXA @ [facility]: -2.2  . Allergy   . Arthritis     PAST SURGICAL HISTORY:   Past Surgical History  Procedure Laterality Date  . Cardiac defibrillator placement  12/07/11    SJM ICD implanted for recurrent Torsades with syncope  . Aneursym clipping      cerebral    SOCIAL HISTORY:   History   Social History  . Marital Status: Widowed    Spouse Name: N/A    Number of Children: N/A  . Years of Education: N/A   Occupational History  . Not on file.   Social History Main Topics  . Smoking status: Former Smoker -- 0.60 packs/day    Quit date: 11/28/2011  . Smokeless tobacco: Never Used     Comment: Widowed, lives with dtr. retired from [employer])  . Alcohol Use: No  . Drug Use: No  . Sexual Activity: Not on file   Other Topics Concern  . Not on file   Social History Narrative  . No narrative on file    FAMILY HISTORY:   Family Status  Relation Status Death Age  . Father Deceased     renal failure  . Mother Deceased     unknown cause of death  . Sister Alive     unknown medical health  . Brother Alive     healthy  . Brother Deceased     2, one at birth w/ hydrocephalous  . Child Deceased     CA  . Child Alive     alive and well    ROS:  A complete 10 system review of systems was obtained and was unremarkable apart from what is mentioned above.  PHYSICAL EXAMINATION:    VITALS:   Filed Vitals:   11/10/13 1247  BP: 120/76  Pulse: 76  Resp: 16  Height: 5\' 4"  (1.626 m)  Weight: 165 lb 5 oz (74.985 kg)    GEN:  The patient appears stated age and is in NAD. HEENT:  Normocephalic, atraumatic.  The mucous membranes are moist. The superficial temporal arteries are without ropiness or tenderness. CV:  RRR Lungs:  CTAB Neck/HEME:  There are no carotid bruits bilaterally.  Neurological examination:  Orientation: A MoCA was done  11/10/13 and the pt scored 13/30. Cranial nerves: There is good facial symmetry. Pupils  are equal round and reactive to light bilaterally.  Fundoscopic exam is attempted but the disc margins are not well visualized bilaterally. Extraocular muscles are intact. The visual fields are full to confrontational testing. The speech is fluent and clear. Soft palate rises symmetrically and there is no tongue deviation. Hearing is decreased to conversational tone. Sensation: Sensation is intact to light and pinprick throughout (facial, trunk, extremities). Vibration is intact at the bilateral ankle. There is no extinction with double simultaneous stimulation. There is no sensory dermatomal level identified. Motor: Strength is 5/5 in the bilateral upper and lower extremities.   Shoulder shrug is equal and symmetric.  There is no pronator drift. Deep tendon reflexes: Deep tendon reflexes are 1/4 at the bilateral biceps, triceps, brachioradialis, and absent at the bilateral patella and achilles. Plantar responses are downgoing bilaterally.  Movement examination: Tone: There is normal tone in the bilateral upper extremities.  The tone in the lower extremities is normal.  Abnormal movements: There is virtually no tremor today.  She has slight tremor on the R with posture and with intention. Coordination:  There is no decremation with RAM's in the hands or feet, tested with alternating supination/pronation, finger taps, hand opening and closing and heel and toe taps. Gait and Station: The patient has minimal difficulty arising out of a deep-seated chair without the use of the hands. The patient's stride length is normal and she has good arm swing.  There is mild postural instability.   LABS:   Lab Results  Component Value Date   VITAMINB12 288 07/22/2012      ASSESSMENT:   1.  Tremor.  I see no evidence of idiopathic PD or any of the parkinsonian syndromes.  Tremor may be due to prior brain injury from aneurysm  and resulting encephalomalacia.  She is off of carbidopa/levodopa without changes from previous. 2.  Memory loss.  I suspect that this represents a dementia of the Alzheimers type but it could be vascular in nature. 3.  Mild B12 deficiency. 4.  Hx of cerebral aneurysm greater than 20 years ago.  PLAN:   1.  We discussed the diagnosis as well as pathophysiology of the disease.  We discussed treatment options as well as prognostic indicators.  Patient education was provided. 2.  Because of memory, I am going to decrease primidone to once daily and hopefully eventually can get her off of it all together.    Risks, benefits, side effects and alternative therapies were discussed.  The opportunity to ask questions was given and they were answered to the best of my ability.  The patient expressed understanding and willingness to follow the outlined treatment protocols. 3. Her next  Monthly B12 injection was given in the office today.  She will get one B12 injection weekly for the next month and then we will go to monthly. 4.  we will increase aricept to 10 mg daily.  Risks, benefits, side effects and alternative therapies were discussed.  The opportunity to ask questions was given and they were answered to the best of my ability.  The patient expressed understanding and willingness to follow the outlined treatment protocols. 5. Return in about 6 months (around 05/10/2014).

## 2013-12-01 ENCOUNTER — Ambulatory Visit: Payer: Medicare Other | Admitting: Neurology

## 2013-12-08 ENCOUNTER — Other Ambulatory Visit (INDEPENDENT_AMBULATORY_CARE_PROVIDER_SITE_OTHER): Payer: Medicare Other | Admitting: *Deleted

## 2013-12-08 ENCOUNTER — Ambulatory Visit (INDEPENDENT_AMBULATORY_CARE_PROVIDER_SITE_OTHER): Payer: Medicare Other | Admitting: Neurology

## 2013-12-08 DIAGNOSIS — E538 Deficiency of other specified B group vitamins: Secondary | ICD-10-CM

## 2013-12-08 MED ORDER — CYANOCOBALAMIN 1000 MCG/ML IJ SOLN
1000.0000 ug | Freq: Once | INTRAMUSCULAR | Status: AC
  Administered 2013-12-08: 1000 ug via INTRAMUSCULAR

## 2013-12-08 NOTE — Progress Notes (Signed)
Here for B12 injection, completed as nursing visit only.

## 2013-12-13 ENCOUNTER — Other Ambulatory Visit: Payer: Self-pay | Admitting: Nephrology

## 2013-12-13 DIAGNOSIS — R809 Proteinuria, unspecified: Secondary | ICD-10-CM

## 2013-12-22 ENCOUNTER — Ambulatory Visit
Admission: RE | Admit: 2013-12-22 | Discharge: 2013-12-22 | Disposition: A | Discharge status: Home / Self Care | Payer: Medicare Other | Source: Ambulatory Visit | Attending: Nephrology | Admitting: Nephrology

## 2013-12-22 DIAGNOSIS — R809 Proteinuria, unspecified: Secondary | ICD-10-CM

## 2013-12-27 ENCOUNTER — Ambulatory Visit (INDEPENDENT_AMBULATORY_CARE_PROVIDER_SITE_OTHER): Payer: Medicare Other | Admitting: Internal Medicine

## 2013-12-27 ENCOUNTER — Encounter: Payer: Self-pay | Admitting: Internal Medicine

## 2013-12-27 VITALS — BP 140/74 | HR 71 | Ht 64.0 in | Wt 162.0 lb

## 2013-12-27 DIAGNOSIS — N183 Chronic kidney disease, stage 3 unspecified: Secondary | ICD-10-CM

## 2013-12-27 DIAGNOSIS — R001 Bradycardia, unspecified: Secondary | ICD-10-CM | POA: Insufficient documentation

## 2013-12-27 DIAGNOSIS — I498 Other specified cardiac arrhythmias: Secondary | ICD-10-CM

## 2013-12-27 DIAGNOSIS — I472 Ventricular tachycardia: Secondary | ICD-10-CM

## 2013-12-27 DIAGNOSIS — R55 Syncope and collapse: Secondary | ICD-10-CM

## 2013-12-27 DIAGNOSIS — I4729 Other ventricular tachycardia: Secondary | ICD-10-CM

## 2013-12-27 DIAGNOSIS — I10 Essential (primary) hypertension: Secondary | ICD-10-CM

## 2013-12-27 LAB — MDC_IDC_ENUM_SESS_TYPE_INCLINIC
Battery Remaining Longevity: 5
Battery Remaining Percentage: 71 %
Brady Statistic RA Percent Paced: 89 %
HighPow Impedance: 72 Ohm
Implantable Pulse Generator Model: 2257
Implantable Pulse Generator Serial Number: 7016049
Lead Channel Impedance Value: 360 Ohm
Lead Channel Pacing Threshold Pulse Width: 0.4 ms
Lead Channel Sensing Intrinsic Amplitude: 1.5 mV
Lead Channel Setting Pacing Amplitude: 2.5 V
Lead Channel Setting Pacing Pulse Width: 0.5 ms
Lead Channel Setting Sensing Sensitivity: 0.5 mV
MDC IDC MSMT LEADCHNL RA PACING THRESHOLD AMPLITUDE: 0.75 V
MDC IDC MSMT LEADCHNL RA PACING THRESHOLD PULSEWIDTH: 0.4 ms
MDC IDC MSMT LEADCHNL RV IMPEDANCE VALUE: 540 Ohm
MDC IDC MSMT LEADCHNL RV PACING THRESHOLD AMPLITUDE: 0.75 V
MDC IDC MSMT LEADCHNL RV SENSING INTR AMPL: 11.8 mV
MDC IDC SET LEADCHNL RA PACING AMPLITUDE: 2 V
MDC IDC SET ZONE DETECTION INTERVAL: 350 ms
MDC IDC STAT BRADY RV PERCENT PACED: 39 %
Zone Setting Detection Interval: 320 ms

## 2013-12-27 NOTE — Patient Instructions (Addendum)
Remote monitoring is used to monitor your  ICD from home. This monitoring reduces the number of office visits required to check your device to one time per year. It allows [place] to keep an eye on the functioning of your device to ensure it is working properly. You are scheduled for a device check from home on 04-02-2014. You may send your transmission at any time that day. If you have a wireless device, the transmission will be sent automatically. After your physician reviews your transmission, you will receive a postcard with your next transmission date.  Your physician recommends that you schedule a follow-up appointment in: [place] DR.ALLRED and 6 months with Murlean Hark, PA.

## 2013-12-27 NOTE — Progress Notes (Signed)
PCP: Gwendolyn Grant, MD  Erika Rivera is a 78 y.o. female who presents today for routine electrophysiology followup.  Since her last visit, the patient reports doing very well.  Today, she denies symptoms of palpitations, chest pain, shortness of breath,  lower extremity edema, dizziness, presyncope, syncope, or ICD shocks.  The patient is otherwise without complaint today.   Past Medical History  Diagnosis Date  . ARTHRITIS, HAND   . ARTHRITIS, LEFT SHOULDER   . HEARING LOSS   . SMOKER   . CEREBROVASCULAR ACCIDENT, HX OF   . HYPERTENSION   . HYPERLIPIDEMIA   . HYPOTHYROIDISM   . OVERACTIVE BLADDER   . Cerebral aneurysm     s/p clipping  . Polymorphic ventricular tachycardia     s/p St Jude AICD (dual chamber) 12/2011;  Echo: mild LVH, EF 50-555, grade 1 diast dysfxn, trivial AI.   Marland Kitchen Torsade de pointes     PVC dependent  . Syncope     2/2 VTach  . Bradycardia     A paced by AICD  . Essential tremor   . Osteopenia 11/18/2012    10/14/12 DEXA @ [facility]: -2.2  . Allergy   . Arthritis   . CAD (coronary artery disease)     LHC 12/2011: RCA 75% (neg FFR), EF 55-65% - med Tx  . Dementia   . Vitamin B 12 deficiency   . Hypothyroidism   . Chronic kidney disease, stage III (moderate)   . High blood pressure with chronic kidney disease   . Stroke     With rupture of intra-cranial aneurysm  . Polymorphic ventricular tachycardia     s/p ICD placement  . Proteinuria   . Lupus     Pt reports "lupus of skin"   Past Surgical History  Procedure Laterality Date  . Cardiac defibrillator placement  12/07/11    SJM ICD implanted for recurrent Torsades with syncope  . Aneursym clipping      cerebral    Current Outpatient Prescriptions  Medication Sig Dispense Refill  . amLODipine (NORVASC) 10 MG tablet TAKE 1 TABLET (10 MG TOTAL) BY MOUTH DAILY.  90 tablet  2  . BENICAR 40 MG tablet TAKE 1 TABLET BY MOUTH EVERY DAY  90 tablet  3  . Calcium Carbonate-Vitamin D (CALCIUM + D PO) Take 1  capsule by mouth daily.       . Cholecalciferol (VITAMIN D3) 5000 UNITS TABS Take 5,000 Units by mouth daily.  30 tablet    . donepezil (ARICEPT) 10 MG tablet Take 1 tablet (10 mg total) by mouth at bedtime.  90 tablet  3  . levothyroxine (SYNTHROID, LEVOTHROID) 125 MCG tablet Take 1 tablet (125 mcg total) by mouth daily before breakfast.  90 tablet  1  . potassium chloride (KLOR-CON M10) 10 MEQ tablet TAKE 1 TABLET BY MOUTH ONLY ON MON, WED, AND FRI.  90 tablet  2  . primidone (MYSOLINE) 50 MG tablet Take 50 mg by mouth daily.      . propranolol ER (INDERAL LA) 160 MG SR capsule Take 1 capsule (160 mg total) by mouth daily.  90 capsule  1  . simvastatin (ZOCOR) 20 MG tablet TAKE 1 TABLET BY MOUTH AT BEDTIME      . triamcinolone cream (KENALOG) 0.1 % Apply 1 application topically 2 (two) times daily.  45 g  0   No current facility-administered medications for this visit.    Physical Exam: Filed Vitals:   12/27/13 1156  BP: 140/74  Pulse: 71  Height: 5\' 4"  (1.626 m)  Weight: 162 lb (73.483 kg)    GEN- The patient is elderly appearing, alert and oriented x 3 today.   Head- normocephalic, atraumatic Eyes-  Sclera clear, conjunctiva pink Ears- hearing intact Oropharynx- clear Lungs- Clear to ausculation bilaterally, normal work of breathing Chest- ICD pocket is well healed Heart- Regular rate and rhythm, no murmurs, rubs or gallops, PMI not laterally displaced GI- soft, NT, ND, + BS Extremities- no clubbing, cyanosis, or edema  ICD interrogation- reviewed in detail today,  See PACEART report  Assessment and Plan:  1. Polymorphic VT (PVC initiated) Normal ICD function See Pace Art report Continue propranolol long term  2. Sinus bradycardia Rate response adjusted today to promote increased heart rate with activity Continue propranolol due to to #1 above  3. HTN Stable No change required today  4. CRI Dr Serita Grit note is reviewed  5. Syncope Resolved with treatment of #1  and #2 above  merlin Return to see Jerene Pitch in 6 months I will see in a year

## 2014-01-01 ENCOUNTER — Ambulatory Visit: Payer: Medicare Other | Admitting: Internal Medicine

## 2014-01-05 ENCOUNTER — Ambulatory Visit: Payer: Medicare Other

## 2014-01-08 ENCOUNTER — Ambulatory Visit (INDEPENDENT_AMBULATORY_CARE_PROVIDER_SITE_OTHER): Payer: Medicare Other | Admitting: Neurology

## 2014-01-08 DIAGNOSIS — E538 Deficiency of other specified B group vitamins: Secondary | ICD-10-CM

## 2014-01-08 MED ORDER — CYANOCOBALAMIN 1000 MCG/ML IJ SOLN
1000.0000 ug | Freq: Once | INTRAMUSCULAR | Status: AC
  Administered 2014-01-08: 1000 ug via INTRAMUSCULAR

## 2014-01-08 NOTE — Progress Notes (Signed)
Here for B12 and injection completed via RN visit alone

## 2014-01-08 NOTE — Progress Notes (Signed)
Patient in for her monthly b-12 injection. Patient will return in about 1 month.

## 2014-01-09 ENCOUNTER — Ambulatory Visit (INDEPENDENT_AMBULATORY_CARE_PROVIDER_SITE_OTHER): Payer: Medicare Other | Admitting: Internal Medicine

## 2014-01-09 ENCOUNTER — Other Ambulatory Visit (INDEPENDENT_AMBULATORY_CARE_PROVIDER_SITE_OTHER): Payer: Medicare Other

## 2014-01-09 ENCOUNTER — Encounter: Payer: Self-pay | Admitting: Internal Medicine

## 2014-01-09 VITALS — BP 132/78 | HR 74 | Temp 97.8°F | Wt 161.6 lb

## 2014-01-09 DIAGNOSIS — R198 Other specified symptoms and signs involving the digestive system and abdomen: Secondary | ICD-10-CM

## 2014-01-09 DIAGNOSIS — R194 Change in bowel habit: Secondary | ICD-10-CM

## 2014-01-09 DIAGNOSIS — H9190 Unspecified hearing loss, unspecified ear: Secondary | ICD-10-CM

## 2014-01-09 DIAGNOSIS — E039 Hypothyroidism, unspecified: Secondary | ICD-10-CM

## 2014-01-09 LAB — CBC WITH DIFFERENTIAL/PLATELET
BASOS ABS: 0 10*3/uL (ref 0.0–0.1)
BASOS PCT: 0.3 % (ref 0.0–3.0)
Eosinophils Absolute: 0.2 10*3/uL (ref 0.0–0.7)
Eosinophils Relative: 2.9 % (ref 0.0–5.0)
HCT: 31.3 % — ABNORMAL LOW (ref 36.0–46.0)
Hemoglobin: 10.4 g/dL — ABNORMAL LOW (ref 12.0–15.0)
LYMPHS PCT: 34.4 % (ref 12.0–46.0)
Lymphs Abs: 2.3 10*3/uL (ref 0.7–4.0)
MCHC: 33.2 g/dL (ref 30.0–36.0)
MCV: 90.3 fl (ref 78.0–100.0)
MONO ABS: 0.6 10*3/uL (ref 0.1–1.0)
Monocytes Relative: 9.7 % (ref 3.0–12.0)
NEUTROS PCT: 52.7 % (ref 43.0–77.0)
Neutro Abs: 3.5 10*3/uL (ref 1.4–7.7)
PLATELETS: 284 10*3/uL (ref 150.0–400.0)
RBC: 3.46 Mil/uL — AB (ref 3.87–5.11)
RDW: 15.9 % — AB (ref 11.5–14.6)
WBC: 6.6 10*3/uL (ref 4.5–10.5)

## 2014-01-09 LAB — HEPATIC FUNCTION PANEL
ALK PHOS: 61 U/L (ref 39–117)
ALT: 10 U/L (ref 0–35)
AST: 17 U/L (ref 0–37)
Albumin: 3.4 g/dL — ABNORMAL LOW (ref 3.5–5.2)
BILIRUBIN DIRECT: 0.1 mg/dL (ref 0.0–0.3)
BILIRUBIN TOTAL: 0.6 mg/dL (ref 0.3–1.2)
TOTAL PROTEIN: 7.6 g/dL (ref 6.0–8.3)

## 2014-01-09 LAB — BASIC METABOLIC PANEL
BUN: 16 mg/dL (ref 6–23)
CALCIUM: 10 mg/dL (ref 8.4–10.5)
CO2: 25 mEq/L (ref 19–32)
CREATININE: 1.5 mg/dL — AB (ref 0.4–1.2)
Chloride: 109 mEq/L (ref 96–112)
GFR: 42.16 mL/min — ABNORMAL LOW (ref >60.00)
GLUCOSE: 82 mg/dL (ref 70–99)
Potassium: 4.3 mEq/L (ref 3.5–5.1)
Sodium: 139 mEq/L (ref 135–145)

## 2014-01-09 LAB — TSH: TSH: 0.02 u[IU]/mL — ABNORMAL LOW (ref 0.35–5.50)

## 2014-01-09 MED ORDER — LEVOTHYROXINE SODIUM 112 MCG PO TABS: 112.0000 ug | ORAL_TABLET | Freq: Every day | ORAL | Status: DC

## 2014-01-09 NOTE — Progress Notes (Signed)
Subjective:    Patient ID: Erika Rivera, female    DOB: 03-06-1936, 78 y.o.   MRN: [id number]  Diarrhea  This is a chronic problem. The current episode started more than 1 month ago (?Jan 2015). The problem has been waxing and waning. Diarrhea characteristics: soft to watery. The patient states that diarrhea does not awaken her from sleep. Associated symptoms include increased flatus. Pertinent negatives include no abdominal pain, bloating, chills, coughing, fever, headaches, myalgias, vomiting or weight loss. Nothing aggravates the symptoms. There are no known risk factors. She has tried nothing for the symptoms. There is no history of inflammatory bowel disease, irritable bowel syndrome, malabsorption, a recent abdominal surgery or short gut syndrome.   also reviewed chronic medical issues and interval medical events  Past Medical History  Diagnosis Date  . ARTHRITIS, HAND   . ARTHRITIS, LEFT SHOULDER   . HEARING LOSS   . SMOKER   . CEREBROVASCULAR ACCIDENT, HX OF   . HYPERTENSION   . HYPERLIPIDEMIA   . HYPOTHYROIDISM   . OVERACTIVE BLADDER   . Cerebral aneurysm     s/p clipping  . Polymorphic ventricular tachycardia     s/p St Jude AICD (dual chamber) 12/2011;  Echo: mild LVH, EF 50-555, grade 1 diast dysfxn, trivial AI.   Marland Kitchen Torsade de pointes     PVC dependent  . Syncope     2/2 VTach  . Bradycardia     A paced by AICD  . Essential tremor   . Osteopenia 11/18/2012    10/14/12 DEXA @ [facility]: -2.2  . Allergy   . Arthritis   . CAD (coronary artery disease)     LHC 12/2011: RCA 75% (neg FFR), EF 55-65% - med Tx  . Dementia   . Vitamin B 12 deficiency   . Hypothyroidism   . Chronic kidney disease, stage III (moderate)   . High blood pressure with chronic kidney disease   . Stroke     With rupture of intra-cranial aneurysm  . Polymorphic ventricular tachycardia     s/p ICD placement  . Proteinuria   . Lupus     Pt reports "lupus of skin"    Review of Systems    Constitutional: Negative for fever, chills and weight loss.  Respiratory: Negative for cough.   Gastrointestinal: Positive for diarrhea and flatus. Negative for vomiting, abdominal pain and bloating.  Musculoskeletal: Negative for myalgias.  Neurological: Negative for headaches.       Objective:   Physical Exam  BP 132/78  Pulse 74  Temp(Src) 97.8 F (36.6 C) (Oral)  Wt 161 lb 9.6 oz (73.301 kg)  SpO2 97% Wt Readings from Last 3 Encounters:  01/09/14 161 lb 9.6 oz (73.301 kg)  12/27/13 162 lb (73.483 kg)  11/10/13 165 lb 5 oz (74.985 kg)   Constitutional: She appears well-developed and well-nourished. No distress. 2 daughters at side Neck: Normal range of motion. Neck supple. No JVD present. No thyromegaly present.  Cardiovascular: Normal rate, regular rhythm and normal heart sounds.  No murmur heard. No BLE edema. Pulmonary/Chest: Effort normal and breath sounds normal. No respiratory distress. She has no wheezes.  Abdomen: Soft, nontender, nondistended, positive bowel sounds throughout. No mass. No rebound or guarding Rectal. Normal tone, no mass. Soft yellow brown stool in vault, guaiac negative ( supervised/exam repeated by Loyola Mast, PA-S) Psychiatric: She has a normal mood and affect. Her behavior is normal. Judgment and thought content normal.   Lab Results  Component  Value Date   WBC 6.6 09/22/2013   HGB 10.6* 09/22/2013   HCT 32.2* 09/22/2013   PLT 289.0 09/22/2013   GLUCOSE 74 09/22/2013   CHOL 126 09/22/2013   TRIG 122.0 09/22/2013   HDL 53.10 09/22/2013   LDLDIRECT 138.6 04/23/2011   LDLCALC 49 09/22/2013   ALT 11 09/22/2013   AST 17 09/22/2013   NA 141 09/22/2013   K 4.2 09/22/2013   CL 109 09/22/2013   CREATININE 1.6* 09/22/2013   BUN 17 09/22/2013   CO2 27 09/22/2013   TSH 0.05* 09/22/2013   INR 1.03 12/07/2011   HGBA1C 5.1 09/22/2012   MICROALBUR 64.5* 09/22/2013    US Renal  12/22/2013   CLINICAL DATA:  Chronic kidney disease, stage III.   Proteinuria.  EXAM: RENAL/URINARY TRACT ULTRASOUND COMPLETE  COMPARISON:  None.  FINDINGS: Right Kidney:  Length: 9.8 cm. Renal parenchyma is isoechoic to liver parenchyma suggesting renal medical disease. There are multiple cysts, the largest being 3.1 cm in diameter in the midportion of the kidney. No hydronephrosis.  Left Kidney:  Length: 9.8 cm. Slight increased echogenicity of the renal parenchyma consistent with renal medical disease. Multiple cysts with the largest being 3.7 cm in diameter in the upper pole. No hydronephrosis.  Bladder:  Appears normal for degree of bladder distention. Bilateral ureteral jets are identified.  IMPRESSION: 1. Slight increased echogenicity of the renal parenchyma bilaterally consistent with renal medical disease. 2. Bilateral renal cysts. 3. No hydronephrosis.   Electronically Signed   By: Rozetta Nunnery M.D.   On: 12/22/2013 12:58   Colonoscopy 08/16/2013 reviewed -no polyp or mass, followup in 3 years recommended given history of large adenomatous polyp    Assessment & Plan:    Change in bowels, alternating loose stool with regular bowel movement. Guaiac negative today. Abdominal exam benign, no fever or travel. Check labs. Consider change or reduction of Aricept given potential medication side effects causing same. Discussed diet and role of probiotics -weight overall stable, we'll continue to monitor

## 2014-01-09 NOTE — Assessment & Plan Note (Signed)
hx noncompliance with meds summer 2012 Started working with endo for same fall 2012 - reports now taking meds and stabilized 12/2011 Check today - adjust if needed  Lab Results  Component Value Date   TSH 0.05* 09/22/2013

## 2014-01-09 NOTE — Patient Instructions (Signed)
It was good to see you today.  We have reviewed your prior records including labs and tests today  Test(s) ordered today. Your results will be released to [facility] (or called to you) after review, usually within 72hours after test completion. If any changes need to be made, you will be notified at that same time.  Medications reviewed and updated, no changes recommended at this time. Use daily probiotic such as Align for next 2 weeks, then as needed  Please reschedule followup in 3-4 months for semiannual review, call sooner if problems.

## 2014-01-09 NOTE — Addendum Note (Signed)
Addended by: Gwendolyn Grant A on: 01/09/2014 12:50 PM   Modules accepted: Orders

## 2014-01-09 NOTE — Addendum Note (Signed)
Addended by: Gwendolyn Grant A on: 01/09/2014 10:17 AM   Modules accepted: Orders, Level of Service

## 2014-01-09 NOTE — Progress Notes (Signed)
Pre visit review using our clinic review tool, if applicable. No additional management support is needed unless otherwise documented below in the visit note. 

## 2014-01-09 NOTE — Assessment & Plan Note (Signed)
Refer for audiology follow up Erika Rivera

## 2014-01-23 ENCOUNTER — Other Ambulatory Visit: Payer: Self-pay | Admitting: Internal Medicine

## 2014-01-25 ENCOUNTER — Ambulatory Visit (INDEPENDENT_AMBULATORY_CARE_PROVIDER_SITE_OTHER): Payer: Medicare Other | Admitting: Internal Medicine

## 2014-01-25 ENCOUNTER — Encounter: Payer: Self-pay | Admitting: Internal Medicine

## 2014-01-25 ENCOUNTER — Telehealth: Payer: Self-pay | Admitting: Internal Medicine

## 2014-01-25 VITALS — BP 150/84 | Temp 98.6°F | Resp 14 | Ht 62.0 in | Wt 161.0 lb

## 2014-01-25 DIAGNOSIS — I10 Essential (primary) hypertension: Secondary | ICD-10-CM

## 2014-01-25 DIAGNOSIS — M545 Low back pain, unspecified: Secondary | ICD-10-CM

## 2014-01-25 DIAGNOSIS — G309 Alzheimer's disease, unspecified: Secondary | ICD-10-CM

## 2014-01-25 DIAGNOSIS — F0280 Dementia in other diseases classified elsewhere without behavioral disturbance: Secondary | ICD-10-CM

## 2014-01-25 MED ORDER — TRAMADOL HCL 50 MG PO TABS: 50.0000 mg | ORAL_TABLET | Freq: Three times a day (TID) | ORAL | Status: DC | PRN

## 2014-01-25 MED ORDER — TIZANIDINE HCL 4 MG PO TABS: 4.0000 mg | ORAL_TABLET | Freq: Four times a day (QID) | ORAL | Status: DC | PRN

## 2014-01-25 MED ORDER — PREDNISONE 10 MG PO TABS: ORAL_TABLET | ORAL | Status: DC

## 2014-01-25 NOTE — Assessment & Plan Note (Signed)
stable overall by history and exam, and pt to continue medical treatment as before,  to f/u any worsening symptoms or concerns 

## 2014-01-25 NOTE — Telephone Encounter (Signed)
Patient Information:  Caller Name: [name]  Phone: [phone]  Patient: Erika Rivera, Erika Rivera  Gender: Female  DOB: 1936-08-27  Age: 78 Years  PCP: Gwendolyn Grant (Adults only)  Office Follow Up:  Does the office need to follow up with this patient?: No  Instructions For The Office: N/A   Symptoms  Reason For Call & Symptoms: Pt is having lower back pain that started 1.5 weeks ago.  Is there anything OTC she can take?  Pt has not taken any meds thus far.  Reviewed Health History In EMR: Yes  Reviewed Medications In EMR: Yes  Reviewed Allergies In EMR: Yes  Reviewed Surgeries / Procedures: Yes  Date of Onset of Symptoms: 01/14/2014  Guideline(s) Used:  Back Pain  Disposition Per Guideline:   Go to ED Now (or to Office with PCP Approval)  Reason For Disposition Reached:   Sudden onset of severe back pain and age > 67  Advice Given:  Reassurance:  Twisting or heavy lifting can cause back pain.  Call Back If:  Numbness or weakness occur  Bowel/bladder problems occur  You become worse.  Patient Will Follow Care Advice:  YES  Appointment Scheduled:  01/25/2014 18:15:00 Appointment Scheduled Provider:  Cathlean Cower (Adults only)

## 2014-01-25 NOTE — Progress Notes (Signed)
Pre visit review using our clinic review tool, if applicable. No additional management support is needed unless otherwise documented below in the visit note. 

## 2014-01-25 NOTE — Assessment & Plan Note (Signed)
stable overall by history and exam, recent data reviewed with pt, and pt to continue medical treatment as before,  to f/u any worsening symptoms or concerns BP Readings from Last 3 Encounters:  01/25/14 150/84  01/09/14 132/78  12/27/13 140/74

## 2014-01-25 NOTE — Progress Notes (Signed)
Subjective:    Patient ID: Erika Rivera, female    DOB: 1935/10/15, 78 y.o.   MRN: [id number]  HPI  Here to f/u with daughter, with c/o 1.5 wks acute left LBP - no bowel or bladder change, fever, wt loss,  worsening LE pain/numbness/weakness, gait change or falls. Worse to stand up or turning when walking.   Pt denies fever, wt loss, night sweats, loss of appetite, or other constitutional symptoms  Denies urinary symptoms such as dysuria, frequency, urgency, flank pain, hematuria or n/v, fever, chills. Denies worsening reflux, abd pain, dysphagia, n/v, bowel change or blood.  Hx somewhat limited by dementia but Dementia overall stable, and not assoc with behavioral changes such as hallucinations, paranoia, or agitation.  Past Medical History  Diagnosis Date  . ARTHRITIS, HAND   . ARTHRITIS, LEFT SHOULDER   . HEARING LOSS   . SMOKER   . CEREBROVASCULAR ACCIDENT, HX OF   . HYPERTENSION   . HYPERLIPIDEMIA   . HYPOTHYROIDISM   . OVERACTIVE BLADDER   . Cerebral aneurysm     s/p clipping  . Polymorphic ventricular tachycardia     s/p St Jude AICD (dual chamber) 12/2011;  Echo: mild LVH, EF 50-555, grade 1 diast dysfxn, trivial AI.   Marland Kitchen Torsade de pointes     PVC dependent  . Syncope     2/2 VTach  . Bradycardia     A paced by AICD  . Essential tremor   . Osteopenia 11/18/2012    10/14/12 DEXA @ [facility]: -2.2  . Allergy   . Arthritis   . CAD (coronary artery disease)     LHC 12/2011: RCA 75% (neg FFR), EF 55-65% - med Tx  . Dementia   . Vitamin B 12 deficiency   . Hypothyroidism   . Chronic kidney disease, stage III (moderate)   . High blood pressure with chronic kidney disease   . Stroke     With rupture of intra-cranial aneurysm  . Polymorphic ventricular tachycardia     s/p ICD placement  . Proteinuria   . Lupus     Pt reports "lupus of skin"   Past Surgical History  Procedure Laterality Date  . Cardiac defibrillator placement  12/07/11    SJM ICD implanted for recurrent  Torsades with syncope  . Aneursym clipping      cerebral    reports that she quit smoking about 2 years ago. She has never used smokeless tobacco. She reports that she does not drink alcohol or use illicit drugs. family history includes Arthritis in her other; Gout in her father; Heart disease in her other; Hypertension in her other; Kidney disease in her father and other; Thyroid disease in her other. There is no history of Colon polyps, Esophageal cancer, Rectal cancer, or Stomach cancer. Allergies  Allergen Reactions  . Diphenhydramine Hcl Anaphylaxis    Tongue swelling  . Penicillins Rash   Current Outpatient Prescriptions on File Prior to Visit  Medication Sig Dispense Refill  . amLODipine (NORVASC) 10 MG tablet TAKE 1 TABLET (10 MG TOTAL) BY MOUTH DAILY.  90 tablet  2  . BENICAR 40 MG tablet TAKE 1 TABLET BY MOUTH EVERY DAY  90 tablet  3  . bifidobacterium infantis (ALIGN) capsule Take 1 capsule by mouth daily.  30 capsule  11  . Calcium Carbonate-Vitamin D (CALCIUM + D PO) Take 1 capsule by mouth daily.       . Cholecalciferol (VITAMIN D3) 5000 UNITS TABS Take  5,000 Units by mouth daily.  30 tablet    . donepezil (ARICEPT) 10 MG tablet Take 1 tablet (10 mg total) by mouth at bedtime.  90 tablet  3  . levothyroxine (SYNTHROID, LEVOTHROID) 112 MCG tablet Take 1 tablet (112 mcg total) by mouth daily before breakfast.  90 tablet  1  . potassium chloride (KLOR-CON M10) 10 MEQ tablet TAKE 1 TABLET BY MOUTH ONLY ON MON, WED, AND FRI.  90 tablet  2  . primidone (MYSOLINE) 50 MG tablet Take 50 mg by mouth daily.      . propranolol ER (INDERAL LA) 160 MG SR capsule Take 1 capsule (160 mg total) by mouth daily.  90 capsule  1  . simvastatin (ZOCOR) 20 MG tablet TAKE 1 TABLET BY MOUTH AT BEDTIME      . triamcinolone cream (KENALOG) 0.1 % Apply 1 application topically 2 (two) times daily.  45 g  0   No current facility-administered medications on file prior to visit.   Review of Systems   Constitutional: Negative for unusual diaphoresis or other sweats  HENT: Negative for ringing in ear Eyes: Negative for double vision or worsening visual disturbance.  Respiratory: Negative for choking and stridor.   Gastrointestinal: Negative for vomiting or other signifcant bowel change Genitourinary: Negative for hematuria or decreased urine volume.  Musculoskeletal: Negative for other MSK pain or swelling Skin: Negative for color change and worsening wound.  Neurological: Negative for tremors and numbness other than noted  Psychiatric/Behavioral: Negative for decreased concentration or agitation other than above       Objective:   Physical Exam BP 150/84  Temp(Src) 98.6 F (37 C) (Oral)  Resp 14  Ht 5\' 2"  (1.575 m)  Wt 161 lb (73.029 kg)  BMI 29.44 kg/m2 VS noted,  Constitutional: Pt appears well-developed, well-nourished.  HENT: Head: NCAT.  Right Ear: External ear normal.  Left Ear: External ear normal.  Eyes: . Pupils are equal, round, and reactive to light. Conjunctivae and EOM are normal Neck: Normal range of motion. Neck supple.  Cardiovascular: Normal rate and regular rhythm.   Pulmonary/Chest: Effort normal and breath sounds normal.  Abd:  Soft, NT, ND, + BS Neurological: Pt is alert. Not confused , motor intact 5/5 spine NT, has left lumbar paravertebral tender spasm Skin: Skin is warm. No rash Psychiatric: Pt behavior is normal. No agitation.     Assessment & Plan:

## 2014-01-25 NOTE — Patient Instructions (Signed)
Please take all new medication as prescribed - the pain medication, muscle relaxer and short course of lower dose prednisone  Please continue all other medications as before, and refills have been done if requested.  Please have the pharmacy call with any other refills you may need.  Please keep your appointments with your specialists as you may have planned

## 2014-01-25 NOTE — Assessment & Plan Note (Signed)
Mild to mod, c/w msk strain though can compeltely r/o underlying djd or DDD lumbar, for pain control, muscles relaxer, predpack asd,  to f/u any worsening symptoms or concerns

## 2014-01-29 ENCOUNTER — Other Ambulatory Visit: Payer: Self-pay | Admitting: Internal Medicine

## 2014-02-09 ENCOUNTER — Ambulatory Visit (INDEPENDENT_AMBULATORY_CARE_PROVIDER_SITE_OTHER): Payer: Medicare Other | Admitting: Neurology

## 2014-02-09 DIAGNOSIS — D518 Other vitamin B12 deficiency anemias: Secondary | ICD-10-CM

## 2014-02-09 DIAGNOSIS — E538 Deficiency of other specified B group vitamins: Secondary | ICD-10-CM

## 2014-02-09 MED ORDER — CYANOCOBALAMIN 1000 MCG/ML IJ SOLN
1000.0000 ug | Freq: Once | INTRAMUSCULAR | Status: AC
  Administered 2014-02-09: 1000 ug via INTRAMUSCULAR

## 2014-02-09 NOTE — Patient Instructions (Signed)
Vitamin b12 injection to left deltoid with no apparent complications.

## 2014-02-22 ENCOUNTER — Encounter: Payer: Self-pay | Admitting: Internal Medicine

## 2014-03-13 ENCOUNTER — Other Ambulatory Visit: Payer: Self-pay | Admitting: Internal Medicine

## 2014-03-16 ENCOUNTER — Ambulatory Visit (INDEPENDENT_AMBULATORY_CARE_PROVIDER_SITE_OTHER): Payer: Medicare Other | Admitting: Neurology

## 2014-03-16 DIAGNOSIS — E538 Deficiency of other specified B group vitamins: Secondary | ICD-10-CM

## 2014-03-16 MED ORDER — CYANOCOBALAMIN 1000 MCG/ML IJ SOLN
1000.0000 ug | Freq: Once | INTRAMUSCULAR | Status: AC
  Administered 2014-03-16: 1000 ug via INTRAMUSCULAR

## 2014-03-16 NOTE — Patient Instructions (Signed)
Follow up in one month for repeat b12 injection.

## 2014-03-16 NOTE — Progress Notes (Signed)
B12 injection given in right deltoid with no apparent complications.

## 2014-03-20 ENCOUNTER — Ambulatory Visit (INDEPENDENT_AMBULATORY_CARE_PROVIDER_SITE_OTHER): Payer: Medicare Other

## 2014-03-20 VITALS — BP 135/77 | HR 70 | Resp 16 | Ht 65.0 in | Wt 161.0 lb

## 2014-03-20 DIAGNOSIS — L400 Psoriasis vulgaris: Secondary | ICD-10-CM

## 2014-03-20 DIAGNOSIS — L408 Other psoriasis: Secondary | ICD-10-CM

## 2014-03-20 DIAGNOSIS — M79609 Pain in unspecified limb: Secondary | ICD-10-CM

## 2014-03-20 DIAGNOSIS — B351 Tinea unguium: Secondary | ICD-10-CM

## 2014-03-20 NOTE — Patient Instructions (Signed)
Onychomycosis/Fungal Toenails  WHAT IS IT? An infection that lies within the keratin of your nail plate that is caused by a fungus.  WHY ME? Fungal infections affect all ages, sexes, races, and creeds.  There may be many factors that predispose you to a fungal infection such as age, coexisting medical conditions such as diabetes, or an autoimmune disease; stress, medications, fatigue, genetics, etc.  Bottom line: fungus thrives in a warm, moist environment and your shoes offer such a location.  IS IT CONTAGIOUS? Theoretically, yes.  You do not want to share shoes, nail clippers or files with someone who has fungal toenails.  Walking around barefoot in the same room or sleeping in the same bed is unlikely to transfer the organism.  It is important to realize, however, that fungus can spread easily from one nail to the next on the same foot.  HOW DO WE TREAT THIS?  There are several ways to treat this condition.  Treatment may depend on many factors such as age, medications, pregnancy, liver and kidney conditions, etc.  It is best to ask your doctor which options are available to you.  1. No treatment.   Unlike many other medical concerns, you can live with this condition.  However for many people this can be a painful condition and may lead to ingrown toenails or a bacterial infection.  It is recommended that you keep the nails cut short to help reduce the amount of fungal nail. 2. Topical treatment.  These range from herbal remedies to prescription strength nail lacquers.  About 40-50% effective, topicals require twice daily application for approximately 9 to 12 months or until an entirely new nail has grown out.  The most effective topicals are medical grade medications available through physicians offices. 3. Oral antifungal medications.  With an 80-90% cure rate, the most common oral medication requires 3 to 4 months of therapy and stays in your system for a year as the new nail grows out.  Oral  antifungal medications do require blood work to make sure it is a safe drug for you.  A liver function panel will be performed prior to starting the medication and after the first month of treatment.  It is important to have the blood work performed to avoid any harmful side effects.  In general, this medication safe but blood work is required. 4. Laser Therapy.  This treatment is performed by applying a specialized laser to the affected nail plate.  This therapy is noninvasive, fast, and non-painful.  It is not covered by insurance and is therefore, out of pocket.  The results have been very good with a 80-95% cure rate.  [facility] is the only practice in the area to offer this therapy. 5. Permanent Nail Avulsion.  Removing the entire nail so that a new nail will not grow back.   Recommended treatment for fungus nails as follows Fungi-Nail can be obtained at any pharmacy or drugstore over-the-counter without prescription. Apply Fungi-Nail to each toenail once or twice daily for 12 months duration. Paint each nail with a Fungi-Nail similar to pain in nail polish on.  Continued Fungi-Nail application once or twice daily for 12 months as instructed discontinue if any skin irritation were to occur

## 2014-03-20 NOTE — Progress Notes (Signed)
   Subjective:    Patient ID: Erika Rivera, female    DOB: 08-Mar-1936, 78 y.o.   MRN: [id number]  HPI Comments: N debridement L 10 toenails D and O long-term C elongated, thickened, discolored toenails A enclosed shoes, dementia T last podiatric visit 2013     Review of Systems  Psychiatric/Behavioral: Positive for confusion and agitation.  All other systems reviewed and are negative.      Objective:   Physical Exam 78 year old after making female presents this time well-developed well-nourished oriented x3 does have thick hypertrophic friable nails likely not been cut in almost 2 years. Trimming gratified thick discolored brittle yellow in crumbly 1 through 5 bilateral painful and tender both on debridement and palliative nail care and include shoe wear.  Lower extremity objective findings as follows vascular status reveals pedal pulses palpable DP and PT plus one over 4 bilateral capillary refill time 3 seconds all digits epicritic sensations intact and symmetric bilateral result plantar response DTRs not listed dermatologic the skin color pigment normal hair growth absent there is a dry scaly plaque does have likely plaque texturized is below the ankles left more so than right with a dry scale consistent with psoriasis nails thick hypertrophic brittle friable incurvated and criptotic 1 through 5 bilateral tender on palpation with enclosed shoes orthopedic biomechanical exam mild flexible digital contractures noted one through 5 bilateral there is a rectus foot type otherwise a no x-rays taken at this time neurologically again unremarkable normal plantar response DTRs not listed       Assessment & Plan:  Assessment this time is onychomycosis thick brittle crumbly friable mycotic nails 1 through 5 bilateral also history dermatitis likely plaque psoriasis recommend following up with her dermatologist or primary physician she has been on a cream for this previously but has not been  using it. This tendon thick brittle crumbly friable nails 1 through 5 bilateral debrided recommended topical antifungal of based on allergy and other contraindications will use of over-the-counter Fungi-Nail which can be obtained at any pharmacy without prescription at this time apply Fungi-Nail once or twice daily D. all affected nails for 12 months duration as instructed recheck in 3 months for followup for continued palliative nail care in the future as needed next  Harriet Masson DPM

## 2014-03-23 ENCOUNTER — Encounter: Payer: Self-pay | Admitting: Internal Medicine

## 2014-03-23 ENCOUNTER — Ambulatory Visit (INDEPENDENT_AMBULATORY_CARE_PROVIDER_SITE_OTHER): Payer: Medicare Other | Admitting: Internal Medicine

## 2014-03-23 VITALS — BP 122/68 | HR 72 | Temp 98.2°F | Wt 161.8 lb

## 2014-03-23 DIAGNOSIS — E039 Hypothyroidism, unspecified: Secondary | ICD-10-CM

## 2014-03-23 DIAGNOSIS — I10 Essential (primary) hypertension: Secondary | ICD-10-CM

## 2014-03-23 DIAGNOSIS — R259 Unspecified abnormal involuntary movements: Secondary | ICD-10-CM

## 2014-03-23 DIAGNOSIS — E785 Hyperlipidemia, unspecified: Secondary | ICD-10-CM

## 2014-03-23 MED ORDER — PROPRANOLOL HCL ER 160 MG PO CP24
160.0000 mg | ORAL_CAPSULE | Freq: Every day | ORAL | Status: DC
Start: 2014-03-23 — End: 2014-09-27

## 2014-03-23 MED ORDER — LEVOTHYROXINE SODIUM 112 MCG PO TABS: 112.0000 ug | ORAL_TABLET | Freq: Every day | ORAL | Status: DC

## 2014-03-23 MED ORDER — SIMVASTATIN 20 MG PO TABS: 20.0000 mg | ORAL_TABLET | Freq: Every day | ORAL | Status: DC

## 2014-03-23 NOTE — Assessment & Plan Note (Signed)
Hx CVA - prior Noncompliance with statin summer 2012 Improved compliance since - Check annually The current medical regimen is effective;  continue present plan and medications.

## 2014-03-23 NOTE — Patient Instructions (Signed)
It was good to see you today.  We have reviewed your prior records including labs and tests today  Test(s) ordered today. Your results will be released to [facility] (or called to you) after review, usually within 72hours after test completion. If any changes need to be made, you will be notified at that same time.  Medications reviewed and updated, no changes recommended at this time. Refill on medication(s) as discussed today.  Please schedule followup in 6 months for annual exam and labs, call sooner if problems.

## 2014-03-23 NOTE — Assessment & Plan Note (Signed)
BP Readings from Last 3 Encounters:  03/23/14 122/68  03/20/14 135/77  01/25/14 150/84   Added Bbloc to ARB/hct 04/2011 for BP control and RUE tremor symptoms  Amlodipine added by cards 03/2012 Encouraged compliance -no changes at this time

## 2014-03-23 NOTE — Assessment & Plan Note (Signed)
hx noncompliance with meds summer 2012 Started working with endo for same fall 2012 -  Family reports pt now taking meds as rx'd Dose reduced 01/2014 - Check today - adjust if needed  Lab Results  Component Value Date   TSH 0.02* 01/09/2014

## 2014-03-23 NOTE — Progress Notes (Signed)
Subjective:    Patient ID: Erika Rivera, female    DOB: Mar 06, 1936, 78 y.o.   MRN: [id number]  HPI  Patient is here for follow up  Reviewed chronic medical issues and interval medical events  Past Medical History  Diagnosis Date  . ARTHRITIS, HAND   . ARTHRITIS, LEFT SHOULDER   . HEARING LOSS   . SMOKER   . CEREBROVASCULAR ACCIDENT, HX OF   . HYPERTENSION   . HYPERLIPIDEMIA   . HYPOTHYROIDISM   . OVERACTIVE BLADDER   . Cerebral aneurysm     s/p clipping  . Polymorphic ventricular tachycardia     s/p St Jude AICD (dual chamber) 12/2011;  Echo: mild LVH, EF 50-555, grade 1 diast dysfxn, trivial AI.   Marland Kitchen Torsade de pointes     PVC dependent  . Syncope     2/2 VTach  . Bradycardia     A paced by AICD  . Essential tremor   . Osteopenia 11/18/2012    10/14/12 DEXA @ [facility]: -2.2  . Allergy   . Arthritis   . CAD (coronary artery disease)     LHC 12/2011: RCA 75% (neg FFR), EF 55-65% - med Tx  . Dementia   . Vitamin B 12 deficiency   . Hypothyroidism   . Chronic kidney disease, stage III (moderate)   . High blood pressure with chronic kidney disease   . Stroke     With rupture of intra-cranial aneurysm  . Polymorphic ventricular tachycardia     s/p ICD placement  . Proteinuria   . Lupus     Pt reports "lupus of skin"    Review of Systems  Constitutional: Negative for fever and fatigue.  HENT: Positive for rhinorrhea. Negative for sinus pressure.   Respiratory: Negative for cough and shortness of breath.   Cardiovascular: Negative for chest pain and leg swelling.  Gastrointestinal: Negative for diarrhea and constipation.       Objective:   Physical Exam  BP 122/68  Pulse 72  Temp(Src) 98.2 F (36.8 C) (Oral)  Wt 161 lb 12.8 oz (73.392 kg)  SpO2 97% Wt Readings from Last 3 Encounters:  03/23/14 161 lb 12.8 oz (73.392 kg)  03/20/14 161 lb (73.029 kg)  01/25/14 161 lb (73.029 kg)   Constitutional: She appears well-developed and well-nourished. No distress.  dtrs at side Neck: Normal range of motion. Neck supple. No JVD present. No thyromegaly present.  Cardiovascular: Normal rate, regular rhythm and normal heart sounds.  No murmur heard. No BLE edema. Pulmonary/Chest: Effort normal and breath sounds normal. No respiratory distress. She has no wheezes.  Psychiatric: She has a normal mood and affect. Her behavior is normal. Judgment and thought content normal.   Lab Results  Component Value Date   WBC 6.6 01/09/2014   HGB 10.4* 01/09/2014   HCT 31.3* 01/09/2014   PLT 284.0 01/09/2014   GLUCOSE 82 01/09/2014   CHOL 126 09/22/2013   TRIG 122.0 09/22/2013   HDL 53.10 09/22/2013   LDLDIRECT 138.6 04/23/2011   LDLCALC 49 09/22/2013   ALT 10 01/09/2014   AST 17 01/09/2014   NA 139 01/09/2014   K 4.3 01/09/2014   CL 109 01/09/2014   CREATININE 1.5* 01/09/2014   BUN 16 01/09/2014   CO2 25 01/09/2014   TSH 0.02* 01/09/2014   INR 1.03 12/07/2011   HGBA1C 5.1 09/22/2012   MICROALBUR 64.5* 09/22/2013    US Renal  12/22/2013   CLINICAL DATA:  Chronic kidney disease,  stage III.  Proteinuria.  EXAM: RENAL/URINARY TRACT ULTRASOUND COMPLETE  COMPARISON:  None.  FINDINGS: Right Kidney:  Length: 9.8 cm. Renal parenchyma is isoechoic to liver parenchyma suggesting renal medical disease. There are multiple cysts, the largest being 3.1 cm in diameter in the midportion of the kidney. No hydronephrosis.  Left Kidney:  Length: 9.8 cm. Slight increased echogenicity of the renal parenchyma consistent with renal medical disease. Multiple cysts with the largest being 3.7 cm in diameter in the upper pole. No hydronephrosis.  Bladder:  Appears normal for degree of bladder distention. Bilateral ureteral jets are identified.  IMPRESSION: 1. Slight increased echogenicity of the renal parenchyma bilaterally consistent with renal medical disease. 2. Bilateral renal cysts. 3. No hydronephrosis.   Electronically Signed   By: Rozetta Nunnery M.D.   On: 12/22/2013 12:58       Assessment & Plan:   allergic  rhinitis - advised OTC antihistamine - will call if rhinorrhea unimproved  Problem List Items Addressed This Visit   HYPERLIPIDEMIA     Hx CVA - prior Noncompliance with statin summer 2012 Improved compliance since - Check annually The current medical regimen is effective;  continue present plan and medications.    Relevant Medications      propranolol (INDERAL LA) SR capsule      simvastatin (ZOCOR) tablet   HYPERTENSION      BP Readings from Last 3 Encounters:  03/23/14 122/68  03/20/14 135/77  01/25/14 150/84   Added Bbloc to ARB/hct 04/2011 for BP control and RUE tremor symptoms  Amlodipine added by cards 03/2012 Encouraged compliance -no changes at this time    Relevant Medications      propranolol (INDERAL LA) SR capsule      simvastatin (ZOCOR) tablet   HYPOTHYROIDISM - Primary      hx noncompliance with meds summer 2012 Started working with endo for same fall 2012 -  Family reports pt now taking meds as rx'd Dose reduced 01/2014 - Check today - adjust if needed  Lab Results  Component Value Date   TSH 0.02* 01/09/2014      Relevant Medications      propranolol (INDERAL LA) SR capsule      levothyroxine (SYNTHROID, LEVOTHROID) tablet   TREMOR     Chronic and progressive R hand tremor -  S/p neuro eval/tx sprng 2013 >> dx BET  Not improved with sinemet trial; now on primidone in addition to Inderal - improved The current medical regimen is effective;  continue present plan and medications.

## 2014-03-23 NOTE — Progress Notes (Signed)
Pre visit review using our clinic review tool, if applicable. No additional management support is needed unless otherwise documented below in the visit note. 

## 2014-03-23 NOTE — Assessment & Plan Note (Signed)
Chronic and progressive R hand tremor -  S/p neuro eval/tx sprng 2013 >> dx BET  Not improved with sinemet trial; now on primidone in addition to Inderal - improved The current medical regimen is effective;  continue present plan and medications.

## 2014-03-24 ENCOUNTER — Telehealth: Payer: Self-pay | Admitting: Internal Medicine

## 2014-03-24 NOTE — Telephone Encounter (Signed)
Relevant patient education mailed to patient.  

## 2014-04-02 ENCOUNTER — Ambulatory Visit (INDEPENDENT_AMBULATORY_CARE_PROVIDER_SITE_OTHER): Payer: Medicare Other | Admitting: *Deleted

## 2014-04-02 DIAGNOSIS — I472 Ventricular tachycardia: Secondary | ICD-10-CM

## 2014-04-02 DIAGNOSIS — I4729 Other ventricular tachycardia: Secondary | ICD-10-CM

## 2014-04-02 NOTE — Progress Notes (Signed)
Remote ICD transmission.   

## 2014-04-03 LAB — MDC_IDC_ENUM_SESS_TYPE_REMOTE
Battery Remaining Longevity: 59 mo
Battery Voltage: 2.96 V
Brady Statistic AS VP Percent: 1 %
Brady Statistic AS VS Percent: 1.5 %
Brady Statistic RA Percent Paced: 92 %
Brady Statistic RV Percent Paced: 44 %
Date Time Interrogation Session: 20150629060017
HIGH POWER IMPEDANCE MEASURED VALUE: 68 Ohm
HighPow Impedance: 68 Ohm
Lead Channel Impedance Value: 540 Ohm
Lead Channel Pacing Threshold Amplitude: 0.75 V
Lead Channel Pacing Threshold Amplitude: 0.75 V
Lead Channel Pacing Threshold Pulse Width: 0.5 ms
Lead Channel Sensing Intrinsic Amplitude: 11.8 mV
Lead Channel Setting Pacing Amplitude: 2 V
MDC IDC MSMT BATTERY REMAINING PERCENTAGE: 69 %
MDC IDC MSMT LEADCHNL RA IMPEDANCE VALUE: 360 Ohm
MDC IDC MSMT LEADCHNL RA PACING THRESHOLD PULSEWIDTH: 0.5 ms
MDC IDC MSMT LEADCHNL RA SENSING INTR AMPL: 1.9 mV
MDC IDC PG SERIAL: 7016049
MDC IDC SET LEADCHNL RV PACING AMPLITUDE: 2.5 V
MDC IDC SET LEADCHNL RV PACING PULSEWIDTH: 0.5 ms
MDC IDC SET LEADCHNL RV SENSING SENSITIVITY: 0.5 mV
MDC IDC STAT BRADY AP VP PERCENT: 44 %
MDC IDC STAT BRADY AP VS PERCENT: 52 %
Zone Setting Detection Interval: 320 ms
Zone Setting Detection Interval: 350 ms

## 2014-04-05 ENCOUNTER — Telehealth: Payer: Self-pay | Admitting: *Deleted

## 2014-04-05 ENCOUNTER — Telehealth: Payer: Self-pay | Admitting: Neurology

## 2014-04-05 MED ORDER — DONEPEZIL HCL 10 MG PO TABS: 10.0000 mg | ORAL_TABLET | Freq: Every day | ORAL | Status: DC

## 2014-04-05 NOTE — Telephone Encounter (Signed)
Pt's daughter called requesting a refill for DONEPEZIL 5MG   Pharmacy: [facility] in [place]  C/b [phone]

## 2014-04-05 NOTE — Telephone Encounter (Signed)
RX sent to patient's pharmacy for Donepezil 10 mg per last office note.

## 2014-04-05 NOTE — Telephone Encounter (Signed)
Daughter states md was going to rx something for mom allergies at last ov. Look back at ov no notes stating about allergies. pls advise...[name]

## 2014-04-09 MED ORDER — LORATADINE 10 MG PO TABS: 10.0000 mg | ORAL_TABLET | Freq: Every day | ORAL | Status: DC

## 2014-04-09 NOTE — Telephone Encounter (Signed)
Called pt daughter no answer LMOM with md request.../lmb

## 2014-04-09 NOTE — Telephone Encounter (Signed)
claritin 10mg  daily - erx done

## 2014-04-11 ENCOUNTER — Encounter: Payer: Self-pay | Admitting: Cardiology

## 2014-04-16 ENCOUNTER — Encounter: Payer: Self-pay | Admitting: Internal Medicine

## 2014-04-20 ENCOUNTER — Ambulatory Visit (INDEPENDENT_AMBULATORY_CARE_PROVIDER_SITE_OTHER): Payer: Medicare Other | Admitting: Neurology

## 2014-04-20 DIAGNOSIS — E538 Deficiency of other specified B group vitamins: Secondary | ICD-10-CM

## 2014-04-20 MED ORDER — CYANOCOBALAMIN 1000 MCG/ML IJ SOLN
1000.0000 ug | Freq: Once | INTRAMUSCULAR | Status: AC
  Administered 2014-04-20: 1000 ug via INTRAMUSCULAR

## 2014-04-20 NOTE — Progress Notes (Signed)
B12 injection to left deltoid with no apparent problems.

## 2014-04-21 ENCOUNTER — Other Ambulatory Visit: Payer: Self-pay | Admitting: Internal Medicine

## 2014-05-02 ENCOUNTER — Telehealth: Payer: Self-pay | Admitting: *Deleted

## 2014-05-02 ENCOUNTER — Other Ambulatory Visit: Payer: Self-pay | Admitting: Internal Medicine

## 2014-05-02 MED ORDER — FLUTICASONE PROPIONATE 50 MCG/ACT NA SUSP: 1.0000 | Freq: Every day | NASAL | Status: DC

## 2014-05-02 NOTE — Telephone Encounter (Signed)
Add Flonase nose spray -erx done

## 2014-05-02 NOTE — Telephone Encounter (Signed)
Called pt daughter (bernadette) gave md response...Erika Rivera

## 2014-05-02 NOTE — Telephone Encounter (Signed)
Left msg on triage 05/01/14 @ 4:49 requesting md to call something in for mom sinus. The over the counter claritin is not helping...[name]

## 2014-05-11 ENCOUNTER — Ambulatory Visit (INDEPENDENT_AMBULATORY_CARE_PROVIDER_SITE_OTHER): Payer: Medicare Other | Admitting: Neurology

## 2014-05-11 ENCOUNTER — Encounter: Payer: Self-pay | Admitting: Neurology

## 2014-05-11 VITALS — BP 130/70 | HR 72 | Resp 16 | Ht 64.0 in | Wt 161.0 lb

## 2014-05-11 DIAGNOSIS — F0280 Dementia in other diseases classified elsewhere without behavioral disturbance: Secondary | ICD-10-CM

## 2014-05-11 DIAGNOSIS — R259 Unspecified abnormal involuntary movements: Secondary | ICD-10-CM

## 2014-05-11 DIAGNOSIS — G309 Alzheimer's disease, unspecified: Secondary | ICD-10-CM

## 2014-05-11 NOTE — Patient Instructions (Signed)
1. Discontinue Primidone.  2. Continue monthly B12 injections. 3. Follow up in 6 months.

## 2014-05-11 NOTE — Progress Notes (Signed)
Erika Rivera was seen today in the movement disorders clinic for neurologic f/u for tremor and memory loss.   This patient is accompanied in the office by her child who supplements the history.   I weaned her off the L-dopa because I did not think that she was parkinsonian.  She has noticed no difference off of the medication compared to when she was on the medication.  She continues to have tremor that is most bothersome when she goes to pour something or when she eats.  She remains on propranolol 160 mg daily.  She was started on primidone last visit.  She is having no SE but she doesn't think it has helped.  05/31/13 update:    The patient is accompanied by her daughter who supplements the history.  Her memory is about the same.  Sometimes, her daughter states that she will revert back to "her childhood." She is not exercising.  She sits and watches TV all day and may do some "puzzles."  She will sometimes with water on. Her daughter does the cooking and driving.  Her daughter does state that she has trouble walking long distances and would like to see if they can get a handicap sticker.  The patient herself does not drive.  Last visit, her primidone was increased for tremor.  Her tremor has been markedly improved.  She does have a history of B12 deficiency and has been getting B12 injections faithfully.  11/10/13:  This patient is accompanied in the office by her child who supplements the history.  The patient has a history of dementia.  Memory has been about the same.  She is on Aricept, 5 mg which was started last August.  She is tolerating it well.  She continues to receive monthly B12 injections.  She continues to have some degree of tremor, but her daughter states that she rarely complains about it.  Today in the office, however, she complains that she has difficulty doing her crossword puzzles because of it.  Her daughter states that she does not notice any limitations because of the  tremor.  05/11/2014 update: Patient presents today for followup, accompanied by her daughter who supplements the history.  Patient has a history of Alzheimer's dementia.  Last visit, her Aricept was increased to 10 mg. Memory has been stable. I have been trying to wean her primidone for tremor. Still has tremor but is no worse with the wean. She is currently on 50 mg once per day.  She continues to receive B12 injections monthly for B12 deficiency.  There is no fam hx of tremor.   PREVIOUS MEDICATIONS: Sinemet  ALLERGIES:   Allergies  Allergen Reactions  . Diphenhydramine Hcl Anaphylaxis    Tongue swelling  . Penicillins Rash    CURRENT MEDICATIONS:  Current Outpatient Prescriptions on File Prior to Visit  Medication Sig Dispense Refill  . amLODipine (NORVASC) 10 MG tablet TAKE 1 TABLET (10 MG TOTAL) BY MOUTH DAILY.  90 tablet  2  . BENICAR 40 MG tablet TAKE 1 TABLET BY MOUTH EVERY DAY  90 tablet  3  . Calcium Carbonate-Vitamin D (CALCIUM + D PO) Take 1 capsule by mouth daily.       . Cholecalciferol (VITAMIN D3) 5000 UNITS TABS Take 5,000 Units by mouth daily.  30 tablet    . donepezil (ARICEPT) 10 MG tablet Take 1 tablet (10 mg total) by mouth at bedtime.  90 tablet  3  . levothyroxine (SYNTHROID, LEVOTHROID) 112  MCG tablet Take 1 tablet (112 mcg total) by mouth daily before breakfast.  90 tablet  3  . loratadine (CLARITIN) 10 MG tablet Take 1 tablet (10 mg total) by mouth daily.  90 tablet  3  . potassium chloride (KLOR-CON M10) 10 MEQ tablet TAKE 1 TABLET BY MOUTH ONLY ON MON, WED, AND FRI.  90 tablet  2  . primidone (MYSOLINE) 50 MG tablet TAKE 1 TABLET (50 MG TOTAL) BY MOUTH 2 (TWO) TIMES DAILY.  180 tablet  1  . propranolol ER (INDERAL LA) 160 MG SR capsule Take 1 capsule (160 mg total) by mouth daily.  90 capsule  3  . simvastatin (ZOCOR) 20 MG tablet Take 1 tablet (20 mg total) by mouth daily at 6 PM.  90 tablet  3  . simvastatin (ZOCOR) 20 MG tablet TAKE 1 TABLET AT BEDTIME  90  tablet  1  . triamcinolone cream (KENALOG) 0.1 % Apply 1 application topically 2 (two) times daily.  45 g  0   No current facility-administered medications on file prior to visit.    PAST MEDICAL HISTORY:   Past Medical History  Diagnosis Date  . ARTHRITIS, HAND   . ARTHRITIS, LEFT SHOULDER   . HEARING LOSS   . SMOKER   . CEREBROVASCULAR ACCIDENT, HX OF   . HYPERTENSION   . HYPERLIPIDEMIA   . HYPOTHYROIDISM   . OVERACTIVE BLADDER   . Cerebral aneurysm     s/p clipping  . Polymorphic ventricular tachycardia     s/p St Jude AICD (dual chamber) 12/2011;  Echo: mild LVH, EF 50-555, grade 1 diast dysfxn, trivial AI.   Marland Kitchen Torsade de pointes     PVC dependent  . Syncope     2/2 VTach  . Bradycardia     A paced by AICD  . Essential tremor   . Osteopenia 11/18/2012    10/14/12 DEXA @ [facility]: -2.2  . Allergy   . Arthritis   . CAD (coronary artery disease)     LHC 12/2011: RCA 75% (neg FFR), EF 55-65% - med Tx  . Dementia   . Vitamin B 12 deficiency   . Hypothyroidism   . Chronic kidney disease, stage III (moderate)   . High blood pressure with chronic kidney disease   . Stroke     With rupture of intra-cranial aneurysm  . Polymorphic ventricular tachycardia     s/p ICD placement  . Proteinuria   . Lupus     Pt reports "lupus of skin"    PAST SURGICAL HISTORY:   Past Surgical History  Procedure Laterality Date  . Cardiac defibrillator placement  12/07/11    SJM ICD implanted for recurrent Torsades with syncope  . Aneursym clipping      cerebral    SOCIAL HISTORY:   History   Social History  . Marital Status: Widowed    Spouse Name: N/A    Number of Children: N/A  . Years of Education: N/A   Occupational History  . Not on file.   Social History Main Topics  . Smoking status: Former Smoker -- 0.60 packs/day    Quit date: 11/28/2011  . 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 Alcohol Use: No   . Drug Use: No  . Sexual Activity: Not on file   Other Topics Concern  . Not on file   Social History Narrative  . No  narrative on file    FAMILY HISTORY:   Family Status  Relation Status Death Age  . Father Deceased     renal failure  . Mother Deceased     unknown cause of death  . Sister Alive     unknown medical health  . Brother Alive     healthy  . Brother Deceased     2, one at birth w/ hydrocephalous  . Child Deceased     CA  . Child Alive     alive and well    ROS:  A complete 10 system review of systems was obtained and was unremarkable apart from what is mentioned above.  PHYSICAL EXAMINATION:    VITALS:   Filed Vitals:   05/11/14 1015  BP: 130/70  Pulse: 72  Resp: 16  Height: 5\' 4"  (1.626 m)  Weight: 161 lb (73.029 kg)    GEN:  The patient appears stated age and is in NAD. HEENT:  Normocephalic, atraumatic.  The mucous membranes are moist. The superficial temporal arteries are without ropiness or tenderness. CV:  RRR Lungs:  CTAB Neck/HEME:  There are no carotid bruits bilaterally.  Neurological examination:  Orientation: A MoCA was done 11/10/13 and the pt scored 13/30. Cranial nerves: There is good facial symmetry. Pupils are equal round and reactive to light bilaterally.  Fundoscopic exam is attempted but the disc margins are not well visualized bilaterally. Extraocular muscles are intact. The visual fields are full to confrontational testing. The speech is fluent and clear. Soft palate rises symmetrically and there is no tongue deviation. Hearing is decreased to conversational tone. Sensation: Sensation is intact to light touch throughout. Motor: Strength is 5/5 in the bilateral upper and lower extremities.   Shoulder shrug is equal and symmetric.  There is no pronator drift.   Movement examination: Tone: There is normal tone in the bilateral upper extremities.  The tone in the lower extremities is normal.  Abnormal movements: There is tremor  on the R with the outstretched hand and mild tremor on the R only with archimedes spirals. Coordination:  There is no decremation with RAM's in the hands or feet, tested with alternating supination/pronation, finger taps, hand opening and closing and heel and toe taps. Gait and Station: The patient has minimal difficulty arising out of a deep-seated chair without the use of the hands. Gait is steady.  LABS:   Lab Results  Component Value Date   VITAMINB12 288 07/22/2012      ASSESSMENT:   1.  Tremor.  I see no evidence of idiopathic PD or any of the parkinsonian syndromes.  Tremor may be due to prior brain injury from aneurysm and resulting encephalomalacia.  She is off of carbidopa/levodopa without changes from previous. 2.  Memory loss.  I suspect that this represents a dementia of the Alzheimers type but it could be vascular in nature. 3.  Mild B12 deficiency. 4.  Hx of cerebral aneurysm greater than 20 years ago.  PLAN:   1.  We discussed the diagnosis as well as pathophysiology of the disease.  We discussed treatment options as well as prognostic indicators.  Patient education was provided. 2.  Because of memory, I am going to go ahead and stop the primidone.  Tremor has been mild and not been worsening with weaning of the primidone.   3. Her next  Monthly B12 injection is due mid month 4.  continue aricept to 10 mg daily.  Risks, benefits, side effects and  alternative therapies were discussed.  The opportunity to ask questions was given and they were answered to the best of my ability.  The patient expressed understanding and willingness to follow the outlined treatment protocols. 5. Return in about 6 months (around 11/11/2014).

## 2014-05-25 ENCOUNTER — Ambulatory Visit (INDEPENDENT_AMBULATORY_CARE_PROVIDER_SITE_OTHER): Payer: Medicare Other | Admitting: Neurology

## 2014-05-25 ENCOUNTER — Ambulatory Visit: Payer: Medicare Other

## 2014-05-25 DIAGNOSIS — E538 Deficiency of other specified B group vitamins: Secondary | ICD-10-CM

## 2014-05-25 MED ORDER — CYANOCOBALAMIN 1000 MCG/ML IJ SOLN
1000.0000 ug | Freq: Once | INTRAMUSCULAR | Status: AC
Start: 2014-05-25 — End: 2014-05-25
  Administered 2014-05-25: 1000 ug via INTRAMUSCULAR

## 2014-05-25 NOTE — Progress Notes (Signed)
B12 injection to left deltoid with no apparent complications.   

## 2014-06-01 ENCOUNTER — Other Ambulatory Visit: Payer: Self-pay | Admitting: Internal Medicine

## 2014-06-02 ENCOUNTER — Other Ambulatory Visit: Payer: Self-pay | Admitting: Neurology

## 2014-06-04 NOTE — Telephone Encounter (Signed)
Aricept refill requested. Per last office note- patient to remain on medication. Refill approved and sent to patient's pharmacy.   

## 2014-06-05 ENCOUNTER — Ambulatory Visit (INDEPENDENT_AMBULATORY_CARE_PROVIDER_SITE_OTHER): Payer: Medicare Other | Admitting: Internal Medicine

## 2014-06-05 ENCOUNTER — Encounter: Payer: Self-pay | Admitting: Internal Medicine

## 2014-06-05 VITALS — BP 122/60 | HR 70 | Temp 98.2°F | Wt 161.1 lb

## 2014-06-05 DIAGNOSIS — J208 Acute bronchitis due to other specified organisms: Secondary | ICD-10-CM

## 2014-06-05 DIAGNOSIS — J209 Acute bronchitis, unspecified: Secondary | ICD-10-CM

## 2014-06-05 MED ORDER — LORATADINE 10 MG PO TABS: 10.0000 mg | ORAL_TABLET | Freq: Every day | ORAL | Status: DC

## 2014-06-05 MED ORDER — AZITHROMYCIN 250 MG PO TABS: ORAL_TABLET | ORAL | Status: DC

## 2014-06-05 NOTE — Progress Notes (Signed)
Pre visit review using our clinic review tool, if applicable. No additional management support is needed unless otherwise documented below in the visit note. 

## 2014-06-05 NOTE — Progress Notes (Signed)
   Subjective:    Patient ID: Erika Rivera, female    DOB: 09-27-1936, 78 y.o.   MRN: [id number]  HPI   She describes a cough since 06/03/14. This was in the context of rhinitis with head and chest congestion. The cough is intermittently productive of yellow sputum  Hall's cough drops have  been of minimal benefit  She is an ex-smoker since 2013.  She has no significant symptoms or signs of rhinosinusitis.    Review of Systems Frontal headache, facial pain , nasal purulence, dental pain, sore throat , otic pain or otic discharge denied. No fever , chills or sweats.     Objective:   Physical Exam   Pertinent positive physical findings included: She has significant alopecia; she has multiple senile keratotic lesions over the crown. Bilateral ptosis is present She is wearing hearing of the left; right aid not worn Dental hygiene is extremely poor with multiple missing teeth Breath sounds are decreased without abnormal breath sounds or increased work of breathing.  General appearance:somewhat chronically ill; no acute distress is present.  No  lymphadenopathy about the head, neck, or axilla noted.  Eyes: No conjunctival inflammation or lid edema is present. There is no scleral icterus. Nose:  External nasal examination shows no deformity or inflammation. Nasal mucosa are dry without lesions or exudates. No septal dislocation or deviation.No obstruction to airflow.  Oral exam:  lips and gums are healthy appearing.There is no oropharyngeal erythema or exudate noted.  Neck:  No deformities, thyromegaly, masses, or tenderness noted.    Heart:  Normal rate and regular rhythm. S1 and S2 normal without gallop, murmur, click, rub or other extra sounds.  Extremities:  No cyanosis, edema, or clubbing  noted  Skin: Warm & dry w/o jaundice or tenting.         Assessment & Plan:  #1 acute bronchitis w/o bronchospasm  Plan: See orders and recommendations

## 2014-06-05 NOTE — Patient Instructions (Addendum)
Robitussin-DM should be effective for cough control; Robitussin D should be avoided as it can raise blood pressure and cause cardiac irregularities. Carry room temperature water and sip liberally after coughing.

## 2014-06-15 ENCOUNTER — Other Ambulatory Visit (HOSPITAL_COMMUNITY): Payer: Self-pay | Admitting: Nephrology

## 2014-06-15 DIAGNOSIS — N183 Chronic kidney disease, stage 3 unspecified: Secondary | ICD-10-CM

## 2014-06-15 DIAGNOSIS — R809 Proteinuria, unspecified: Secondary | ICD-10-CM

## 2014-06-19 ENCOUNTER — Other Ambulatory Visit: Payer: Self-pay | Admitting: Radiology

## 2014-06-20 ENCOUNTER — Encounter: Payer: Self-pay | Admitting: Internal Medicine

## 2014-06-20 ENCOUNTER — Ambulatory Visit (INDEPENDENT_AMBULATORY_CARE_PROVIDER_SITE_OTHER): Payer: Medicare Other | Admitting: Internal Medicine

## 2014-06-20 VITALS — BP 132/68 | HR 72 | Ht 65.0 in | Wt 162.0 lb

## 2014-06-20 DIAGNOSIS — I498 Other specified cardiac arrhythmias: Secondary | ICD-10-CM

## 2014-06-20 DIAGNOSIS — R001 Bradycardia, unspecified: Secondary | ICD-10-CM

## 2014-06-20 DIAGNOSIS — I4729 Other ventricular tachycardia: Secondary | ICD-10-CM

## 2014-06-20 DIAGNOSIS — I10 Essential (primary) hypertension: Secondary | ICD-10-CM

## 2014-06-20 DIAGNOSIS — I472 Ventricular tachycardia: Secondary | ICD-10-CM

## 2014-06-20 NOTE — Patient Instructions (Signed)
Remote monitoring is used to monitor your ICD from home. This monitoring reduces the number of office visits required to check your device to one time per year. It allows [place] to keep an eye on the functioning of your device to ensure it is working properly. You are scheduled for a device check from home on 09-20-2014. You may send your transmission at any time that day. If you have a wireless device, the transmission will be sent automatically. After your physician reviews your transmission, you will receive a postcard with your next transmission date.  Your physician recommends that you schedule a follow-up appointment in: 12 months with Dr.Allred

## 2014-06-20 NOTE — Progress Notes (Signed)
PCP: Gwendolyn Grant, MD  Erika Rivera is a 78 y.o. female who presents today for routine electrophysiology followup.  Since her last visit, the patient reports doing very well.  She has had more proteinuria and has a kidney biopsy scheduled for tomorrow.  Today, she denies symptoms of palpitations, chest pain, shortness of breath,  lower extremity edema, dizziness, presyncope, syncope, or ICD shocks.  The patient is otherwise without complaint today.   Past Medical History  Diagnosis Date  . ARTHRITIS, HAND   . ARTHRITIS, LEFT SHOULDER   . HEARING LOSS   . SMOKER   . CEREBROVASCULAR ACCIDENT, HX OF   . HYPERTENSION   . HYPERLIPIDEMIA   . HYPOTHYROIDISM   . OVERACTIVE BLADDER   . Cerebral aneurysm     s/p clipping  . Polymorphic ventricular tachycardia     s/p St Jude AICD (dual chamber) 12/2011;  Echo: mild LVH, EF 50-555, grade 1 diast dysfxn, trivial AI.   Marland Kitchen Torsade de pointes     PVC dependent  . Syncope     2/2 VTach  . Bradycardia     A paced by AICD  . Essential tremor   . Osteopenia 11/18/2012    10/14/12 DEXA @ [facility]: -2.2  . Allergy   . Arthritis   . CAD (coronary artery disease)     LHC 12/2011: RCA 75% (neg FFR), EF 55-65% - med Tx  . Dementia   . Vitamin B 12 deficiency   . Hypothyroidism   . Chronic kidney disease, stage III (moderate)   . High blood pressure with chronic kidney disease   . Stroke     With rupture of intra-cranial aneurysm  . Polymorphic ventricular tachycardia     s/p ICD placement  . Proteinuria   . Lupus     Pt reports "lupus of skin"   Past Surgical History  Procedure Laterality Date  . Cardiac defibrillator placement  12/07/11    SJM ICD implanted for recurrent Torsades with syncope  . Aneursym clipping      cerebral   ROS- all systems are reviewed and negative except as per HPI above  Current Outpatient Prescriptions  Medication Sig Dispense Refill  . amLODipine (NORVASC) 10 MG tablet TAKE 1 TABLET (10 MG TOTAL) BY MOUTH DAILY.   90 tablet  0  . BENICAR 40 MG tablet TAKE 1 TABLET BY MOUTH EVERY DAY  90 tablet  3  . Calcium Carbonate-Vitamin D (CALCIUM + D PO) Take 1 capsule by mouth daily.       . Cholecalciferol (VITAMIN D3) 5000 UNITS TABS Take 5,000 Units by mouth daily.  30 tablet    . donepezil (ARICEPT) 5 MG tablet TAKE 1 TABLET (5 MG TOTAL) BY MOUTH AT BEDTIME.  30 tablet  5  . levothyroxine (SYNTHROID, LEVOTHROID) 112 MCG tablet Take 1 tablet (112 mcg total) by mouth daily before breakfast.  90 tablet  3  . loratadine (CLARITIN) 10 MG tablet Take 1 tablet (10 mg total) by mouth daily.  30 tablet  2  . potassium chloride (KLOR-CON M10) 10 MEQ tablet TAKE 1 TABLET BY MOUTH ONLY ON MON, WED, AND FRI.  90 tablet  2  . propranolol ER (INDERAL LA) 160 MG SR capsule Take 1 capsule (160 mg total) by mouth daily.  90 capsule  3  . simvastatin (ZOCOR) 20 MG tablet Take 1 tablet (20 mg total) by mouth daily at 6 PM.  90 tablet  3  . triamcinolone cream (  KENALOG) 0.1 % Apply 1 application topically daily.       No current facility-administered medications for this visit.    Physical Exam: Filed Vitals:   06/20/14 1548  BP: 132/68  Pulse: 72  Height: 5\' 5"  (1.651 m)  Weight: 162 lb (73.483 kg)    GEN- The patient is elderly appearing, alert and oriented x 3 today.   Head- normocephalic, atraumatic Eyes-  Sclera clear, conjunctiva pink Ears- hearing intact Oropharynx- clear Lungs- Clear to ausculation bilaterally, normal work of breathing Chest- ICD pocket is well healed Heart- Regular rate and rhythm, no murmurs, rubs or gallops, PMI not laterally displaced GI- soft, NT, ND, + BS Extremities- no clubbing, cyanosis, or edema  ICD interrogation- reviewed in detail today,  See PACEART report  Assessment and Plan:  1. Polymorphic VT (PVC initiated) Normal ICD function See Pace Art report Continue propranolol long term  2. Sinus bradycardia Rate response adjusted today to promote increased heart rate with  activity Continue propranolol due to to #1 above  3. HTN Stable No change required today  4. CRI Dr Posey Pronto is following  5. Syncope Resolved with treatment of #1 and #2 above  merlin Return to see me in a year

## 2014-06-21 ENCOUNTER — Ambulatory Visit (HOSPITAL_COMMUNITY)
Admission: RE | Admit: 2014-06-21 | Discharge: 2014-06-21 | Disposition: A | Discharge status: Home / Self Care | Payer: Medicare Other | Source: Ambulatory Visit | Attending: Nephrology | Admitting: Nephrology

## 2014-06-21 DIAGNOSIS — R809 Proteinuria, unspecified: Secondary | ICD-10-CM | POA: Insufficient documentation

## 2014-06-21 DIAGNOSIS — N183 Chronic kidney disease, stage 3 unspecified: Secondary | ICD-10-CM | POA: Diagnosis not present

## 2014-06-21 LAB — CBC
HEMATOCRIT: 29.8 % — AB (ref 36.0–46.0)
Hemoglobin: 9.6 g/dL — ABNORMAL LOW (ref 12.0–15.0)
MCH: 29.3 pg (ref 26.0–34.0)
MCHC: 32.2 g/dL (ref 30.0–36.0)
MCV: 90.9 fL (ref 78.0–100.0)
PLATELETS: 246 10*3/uL (ref 150–400)
RBC: 3.28 MIL/uL — AB (ref 3.87–5.11)
RDW: 16.3 % — ABNORMAL HIGH (ref 11.5–15.5)
WBC: 6.4 10*3/uL (ref 4.0–10.5)

## 2014-06-21 LAB — PROTIME-INR
INR: 0.99 (ref 0.00–1.49)
Prothrombin Time: 13.1 seconds (ref 11.6–15.2)

## 2014-06-21 LAB — APTT: aPTT: 35 seconds (ref 24–37)

## 2014-06-21 MED ORDER — MIDAZOLAM HCL 2 MG/2ML IJ SOLN
INTRAMUSCULAR | Status: AC | PRN
  Administered 2014-06-21: 1 mg via INTRAVENOUS

## 2014-06-21 MED ORDER — LIDOCAINE HCL (PF) 1 % IJ SOLN
INTRAMUSCULAR | Status: AC
  Filled 2014-06-21: qty 5

## 2014-06-21 MED ORDER — MIDAZOLAM HCL 2 MG/2ML IJ SOLN
INTRAMUSCULAR | Status: AC
  Filled 2014-06-21: qty 4

## 2014-06-21 MED ORDER — FENTANYL CITRATE 0.05 MG/ML IJ SOLN
INTRAMUSCULAR | Status: AC
  Filled 2014-06-21: qty 2

## 2014-06-21 MED ORDER — SODIUM CHLORIDE 0.9 % IV SOLN
INTRAVENOUS | Status: AC | PRN
  Administered 2014-06-21: 10 mL/h via INTRAVENOUS

## 2014-06-21 MED ORDER — SODIUM CHLORIDE 0.9 % IV SOLN: INTRAVENOUS | Status: DC

## 2014-06-21 MED ORDER — FENTANYL CITRATE 0.05 MG/ML IJ SOLN
INTRAMUSCULAR | Status: AC | PRN
  Administered 2014-06-21: 50 ug via INTRAVENOUS

## 2014-06-21 NOTE — H&P (Signed)
Chief Complaint: Chronic kidney disease stage 3 New proteinuria  Referring Physician(s): Patel,Jay K.  History of Present Illness: Erika Rivera is a 78 y.o. female  Pt with hx CKD3 New proteinuria Now scheduled for random renal biopsy Complicated medical hx of HTN; CVA; HLD; CAD/Vtach- defibrillator  Past Medical History  Diagnosis Date  . ARTHRITIS, HAND   . ARTHRITIS, LEFT SHOULDER   . HEARING LOSS   . SMOKER   . CEREBROVASCULAR ACCIDENT, HX OF   . HYPERTENSION   . HYPERLIPIDEMIA   . HYPOTHYROIDISM   . OVERACTIVE BLADDER   . Cerebral aneurysm     s/p clipping  . Polymorphic ventricular tachycardia     s/p St Jude AICD (dual chamber) 12/2011;  Echo: mild LVH, EF 50-555, grade 1 diast dysfxn, trivial AI.   Marland Kitchen Torsade de pointes     PVC dependent  . Syncope     2/2 VTach  . Bradycardia     A paced by AICD  . Essential tremor   . Osteopenia 11/18/2012    10/14/12 DEXA @ [facility]: -2.2  . Allergy   . Arthritis   . CAD (coronary artery disease)     LHC 12/2011: RCA 75% (neg FFR), EF 55-65% - med Tx  . Dementia   . Vitamin B 12 deficiency   . Hypothyroidism   . Chronic kidney disease, stage III (moderate)   . High blood pressure with chronic kidney disease   . Stroke     With rupture of intra-cranial aneurysm  . Polymorphic ventricular tachycardia     s/p ICD placement  . Proteinuria   . Lupus     Pt reports "lupus of skin"    Past Surgical History  Procedure Laterality Date  . Cardiac defibrillator placement  12/07/11    SJM ICD implanted for recurrent Torsades with syncope  . Aneursym clipping      cerebral    Allergies: Diphenhydramine hcl and Penicillins  Medications: Prior to Admission medications   Medication Sig Start Date End Date Taking? Authorizing Provider  amLODipine (NORVASC) 10 MG tablet TAKE 1 TABLET (10 MG TOTAL) BY MOUTH DAILY. 06/04/14  Yes Thompson Grayer, MD  BENICAR 40 MG tablet TAKE 1 TABLET BY MOUTH EVERY DAY 01/23/14  Yes Thompson Grayer, MD  Calcium Carbonate-Vitamin D (CALCIUM + D PO) Take 1 capsule by mouth daily.  12/12/12  Yes Historical Provider, MD  Cholecalciferol (VITAMIN D3) 5000 UNITS TABS Take 5,000 Units by mouth daily. 09/22/12  Yes Rowe Clack, MD  donepezil (ARICEPT) 5 MG tablet TAKE 1 TABLET (5 MG TOTAL) BY MOUTH AT BEDTIME. 06/04/14  Yes Rebecca S Tat, DO  levothyroxine (SYNTHROID, LEVOTHROID) 112 MCG tablet Take 1 tablet (112 mcg total) by mouth daily before breakfast. 03/23/14  Yes Rowe Clack, MD  loratadine (CLARITIN) 10 MG tablet Take 1 tablet (10 mg total) by mouth daily. 06/05/14  Yes Rowe Clack, MD  potassium chloride (KLOR-CON M10) 10 MEQ tablet TAKE 1 TABLET BY MOUTH ONLY ON MON, WED, AND FRI. 08/23/13  Yes Thompson Grayer, MD  propranolol ER (INDERAL LA) 160 MG SR capsule Take 1 capsule (160 mg total) by mouth daily. 03/23/14  Yes Rowe Clack, MD  simvastatin (ZOCOR) 20 MG tablet Take 1 tablet (20 mg total) by mouth daily at 6 PM. 03/23/14  Yes Rowe Clack, MD  triamcinolone cream (KENALOG) 0.1 % Apply 1 application topically daily. 09/22/13  Yes Rowe Clack, MD  Family History  Problem Relation Age of Onset  . Gout Father   . Kidney disease Father     kidney failure  . Arthritis Other   . Hypertension Other   . Kidney disease Other   . Heart disease Other   . Thyroid disease Other   . Colon polyps Neg Hx   . Esophageal cancer Neg Hx   . Rectal cancer Neg Hx   . Stomach cancer Neg Hx     History   Social History  . Marital Status: Widowed    Spouse Name: N/A    Number of Children: N/A  . Years of Education: N/A   Social History Main Topics  . Smoking status: Former Smoker -- 0.60 packs/day    Quit date: 11/28/2011  . 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 Alcohol Use: No  . Drug Use: No  . Sexual Activity: Not on file   Other Topics Concern  . Not on file    Social History Narrative  . No narrative on file     Review of Systems: A 12 point ROS discussed and pertinent positives are indicated in the HPI above.  All other systems are negative.  Review of Systems  Constitutional: Positive for fatigue. Negative for activity change and unexpected weight change.  Respiratory: Negative for apnea, chest tightness and shortness of breath.   Cardiovascular: Negative for chest pain.  Gastrointestinal: Negative for nausea and abdominal pain.  Genitourinary: Negative for difficulty urinating.  Musculoskeletal: Negative for back pain.  Psychiatric/Behavioral: Negative for confusion.    Vital Signs: BP 141/56  Pulse 70  Temp(Src) 97.7 F (36.5 C) (Oral)  Resp 20  Ht 5\' 5"  (1.651 m)  Wt 73.483 kg (162 lb)  BMI 26.96 kg/m2  SpO2 100%  Physical Exam  Constitutional: She is oriented to person, place, and time. She appears well-developed.  Cardiovascular: Normal rate and regular rhythm.   No murmur heard. Pulmonary/Chest: Effort normal and breath sounds normal. She has no wheezes.  Abdominal: Soft. Bowel sounds are normal. She exhibits no distension. There is no tenderness.  Musculoskeletal: Normal range of motion.  Neurological: She is alert and oriented to person, place, and time.  Skin: Skin is warm and dry.  Psychiatric: She has a normal mood and affect. Her behavior is normal. Judgment and thought content normal.    Imaging: No results found.  Labs: Lab Results  Component Value Date   WBC 6.4 06/21/2014   HCT 29.8* 06/21/2014   MCV 90.9 06/21/2014   PLT 246 06/21/2014   NA 139 01/09/2014   K 4.3 01/09/2014   CL 109 01/09/2014   CO2 25 01/09/2014   GLUCOSE 82 01/09/2014   BUN 16 01/09/2014   CREATININE 1.5* 01/09/2014   CALCIUM 10.0 01/09/2014   PROT 7.6 01/09/2014   ALBUMIN 3.4* 01/09/2014   AST 17 01/09/2014   ALT 10 01/09/2014   ALKPHOS 61 01/09/2014   BILITOT 0.6 01/09/2014   GFRNONAA 37* 12/08/2011   GFRAA 43* 12/08/2011   INR 0.99 06/21/2014     Assessment and Plan:  CKD stage 3- new proteinuria Scheduled for random renal biopsy Pt and family aware of procedure benefits and risks and agreeable to proceed Consent signed andin chart  Thank you for this interesting consult.  I greatly enjoyed meeting Erika Rivera and look forward to participating in their care.    Lesleigh Noe  Enslie Sahota PAC-IR   I spent a total of 20 minutes face to face in clinical consultation, greater than 50% of which was counseling/coordinating care for random renal bx

## 2014-06-21 NOTE — Procedures (Signed)
Procedure: US guided core biopsy of right kidney Medical Renal disease No complication No significant blood loss 2 x 16G core biopsy Signed,  Johny Shears, DO

## 2014-06-21 NOTE — Discharge Instructions (Signed)
Kidney Biopsy, Care After °Refer to this sheet in the next few weeks. These instructions provide you with information on caring for yourself after your procedure. Your health care provider may also give you more specific instructions. Your treatment has been planned according to current medical practices, but problems sometimes occur. Call your health care provider if you have any problems or questions after your procedure.  °WHAT TO EXPECT AFTER THE PROCEDURE  °· You may notice blood in the urine for the first 24 hours after the biopsy. °· You may feel some pain at the biopsy site for 1-2 weeks after the biopsy. °HOME CARE INSTRUCTIONS °· Do not lift anything heavier than 10 lb (4.5 kg) for 2 weeks. °· Do not take any non-steroidal anti-inflammatory drugs (NSAIDs) or any blood thinners for a week after the biopsy unless instructed to do so by your health care provider. °· Only take medicines for pain, fever, or discomfort as directed by your health care provider. °SEEK MEDICAL CARE IF: °· You have bloody urine more than 24 hours after the biopsy.   °· You develop a fever.   °· You cannot urinate.   °· You have increasing pain at the biopsy site.   °SEEK IMMEDIATE MEDICAL CARE IF: °You feel faint or dizzy.  °Document Released: 05/24/2013 Document Reviewed: 05/24/2013 °[employer]® Patient Information ©2015 [employer]. This information is not intended to replace advice given to you by your health care provider. Make sure you discuss any questions you have with your health care provider. ° °

## 2014-06-21 NOTE — Sedation Documentation (Signed)
Patient denies pain and is resting comfortably.  

## 2014-06-22 ENCOUNTER — Ambulatory Visit (INDEPENDENT_AMBULATORY_CARE_PROVIDER_SITE_OTHER): Payer: Medicare Other

## 2014-06-22 DIAGNOSIS — M79676 Pain in unspecified toe(s): Secondary | ICD-10-CM

## 2014-06-22 DIAGNOSIS — B351 Tinea unguium: Secondary | ICD-10-CM

## 2014-06-22 DIAGNOSIS — M79609 Pain in unspecified limb: Secondary | ICD-10-CM

## 2014-06-22 LAB — MDC_IDC_ENUM_SESS_TYPE_INCLINIC
Battery Remaining Longevity: 56.4 mo
Brady Statistic RA Percent Paced: 94 %
Brady Statistic RV Percent Paced: 44 %
HIGH POWER IMPEDANCE MEASURED VALUE: 67.5 Ohm
Implantable Pulse Generator Serial Number: 7016049
Lead Channel Impedance Value: 337.5 Ohm
Lead Channel Impedance Value: 487.5 Ohm
Lead Channel Pacing Threshold Amplitude: 0.75 V
Lead Channel Pacing Threshold Amplitude: 0.75 V
Lead Channel Pacing Threshold Amplitude: 0.75 V
Lead Channel Pacing Threshold Pulse Width: 0.5 ms
Lead Channel Pacing Threshold Pulse Width: 0.5 ms
Lead Channel Sensing Intrinsic Amplitude: 1.5 mV
Lead Channel Setting Pacing Amplitude: 2 V
Lead Channel Setting Pacing Pulse Width: 0.5 ms
Lead Channel Setting Sensing Sensitivity: 0.5 mV
MDC IDC MSMT LEADCHNL RA PACING THRESHOLD PULSEWIDTH: 0.5 ms
MDC IDC MSMT LEADCHNL RV PACING THRESHOLD AMPLITUDE: 0.75 V
MDC IDC MSMT LEADCHNL RV PACING THRESHOLD PULSEWIDTH: 0.5 ms
MDC IDC MSMT LEADCHNL RV SENSING INTR AMPL: 11.8 mV
MDC IDC PG MODEL: 2257
MDC IDC SESS DTM: 20150916195359
MDC IDC SET LEADCHNL RV PACING AMPLITUDE: 2.5 V
Zone Setting Detection Interval: 320 ms
Zone Setting Detection Interval: 350 ms

## 2014-06-22 NOTE — Patient Instructions (Signed)

## 2014-06-22 NOTE — Progress Notes (Signed)
   Subjective:    Patient ID: Lenor Coffin, female    DOB: Apr 16, 1936, 78 y.o.   MRN: [id number]  HPI Comments: "He said to come back in 3 months, so here I am. Maybe do something with the nails, but there ain't much there."     Review of Systems no new findings or systemic changes noted     Objective:   Physical Exam Lower extremity findings unchanged neurovascular status is intact pedal pulses palpable DP and PT plus one over 4 bilateral capillary refill timed 3-4 seconds. Decreased sensation slightly although intact sensation all toes normal plantar response DTRs not listed dermatologic the skin color pigment normal hair growth absent nails dry brittle crumbly dystrophic and friable with glucose incurvation consistent with onychomycosis patient also has plaque type psoriasis which may be affecting nails the skin dry skin although no open wounds or ulcers no secondary infection is noted this time nails painful tender both on palpation and debridement on debridement this time both hallux nails treated with lumicain and Neosporin with a tendency to get ingrown to severe cryptosis is of nail       Assessment & Plan:  Assessment this time is onychomycosis of nails painful mycotic nails 1 through 5 bilateral debridement at this time return for future palliative care and as-needed basis suggest 3-4 month followup  Harriet Masson DPM

## 2014-06-29 ENCOUNTER — Encounter: Payer: Self-pay | Admitting: Neurology

## 2014-06-29 ENCOUNTER — Ambulatory Visit (INDEPENDENT_AMBULATORY_CARE_PROVIDER_SITE_OTHER): Payer: Medicare Other | Admitting: Neurology

## 2014-06-29 DIAGNOSIS — E538 Deficiency of other specified B group vitamins: Secondary | ICD-10-CM

## 2014-06-29 MED ORDER — CYANOCOBALAMIN 1000 MCG/ML IJ SOLN
1000.0000 ug | Freq: Once | INTRAMUSCULAR | Status: AC
Start: 2014-06-29 — End: 2014-06-29
  Administered 2014-06-29: 1000 ug via INTRAMUSCULAR

## 2014-06-29 NOTE — Progress Notes (Signed)
Pt here for nurse injection only

## 2014-06-29 NOTE — Progress Notes (Signed)
Patient in today for month b-12 injection to right arm. Patient will return in one month for next injection

## 2014-07-06 ENCOUNTER — Encounter (HOSPITAL_COMMUNITY): Payer: Self-pay

## 2014-07-09 ENCOUNTER — Telehealth: Payer: Self-pay

## 2014-07-09 MED ORDER — LEVOTHYROXINE SODIUM 112 MCG PO TABS: 112.0000 ug | ORAL_TABLET | Freq: Every day | ORAL | Status: DC

## 2014-07-09 NOTE — Telephone Encounter (Signed)
Refill request for levothyroxine 112 mcg tab, 1 tab po daily before breakfast, qty 90 w/ 1 rf, sent into pharmacy

## 2014-08-03 ENCOUNTER — Ambulatory Visit (INDEPENDENT_AMBULATORY_CARE_PROVIDER_SITE_OTHER): Payer: Medicare Other | Admitting: *Deleted

## 2014-08-03 DIAGNOSIS — E538 Deficiency of other specified B group vitamins: Secondary | ICD-10-CM

## 2014-08-03 MED ORDER — CYANOCOBALAMIN 1000 MCG/ML IJ SOLN
1000.0000 ug | Freq: Once | INTRAMUSCULAR | Status: AC
  Administered 2014-08-03: 1000 ug via INTRAMUSCULAR

## 2014-08-24 ENCOUNTER — Ambulatory Visit (INDEPENDENT_AMBULATORY_CARE_PROVIDER_SITE_OTHER): Payer: Medicare Other | Admitting: Neurology

## 2014-08-24 DIAGNOSIS — E538 Deficiency of other specified B group vitamins: Secondary | ICD-10-CM

## 2014-08-24 MED ORDER — CYANOCOBALAMIN 1000 MCG/ML IJ SOLN
1000.0000 ug | Freq: Once | INTRAMUSCULAR | Status: AC
  Administered 2014-08-24: 1000 ug via INTRAMUSCULAR

## 2014-08-24 NOTE — Progress Notes (Signed)
B12 injection to right deltoid with no apparent complications.   

## 2014-08-29 ENCOUNTER — Other Ambulatory Visit: Payer: Self-pay | Admitting: Internal Medicine

## 2014-09-03 ENCOUNTER — Other Ambulatory Visit: Payer: Self-pay | Admitting: Internal Medicine

## 2014-09-13 ENCOUNTER — Encounter (HOSPITAL_COMMUNITY): Payer: Self-pay | Admitting: Cardiovascular Disease

## 2014-09-20 ENCOUNTER — Ambulatory Visit (INDEPENDENT_AMBULATORY_CARE_PROVIDER_SITE_OTHER): Payer: Medicare Other | Admitting: *Deleted

## 2014-09-20 ENCOUNTER — Encounter: Payer: Self-pay | Admitting: Internal Medicine

## 2014-09-20 DIAGNOSIS — I472 Ventricular tachycardia: Secondary | ICD-10-CM

## 2014-09-20 DIAGNOSIS — I4729 Other ventricular tachycardia: Secondary | ICD-10-CM

## 2014-09-20 LAB — MDC_IDC_ENUM_SESS_TYPE_REMOTE
Battery Remaining Longevity: 4.3
Battery Remaining Percentage: 63 %
Brady Statistic RA Percent Paced: 97 %
HighPow Impedance: 65 Ohm
Lead Channel Impedance Value: 450 Ohm
Lead Channel Sensing Intrinsic Amplitude: 11.8 mV
Lead Channel Setting Pacing Amplitude: 2 V
Lead Channel Setting Pacing Amplitude: 2.5 V
Lead Channel Setting Sensing Sensitivity: 0.5 mV
MDC IDC MSMT LEADCHNL RA IMPEDANCE VALUE: 340 Ohm
MDC IDC MSMT LEADCHNL RA SENSING INTR AMPL: 1.4 mV
MDC IDC PG MODEL: 2257
MDC IDC PG SERIAL: 7016049
MDC IDC SET LEADCHNL RV PACING PULSEWIDTH: 0.5 ms
MDC IDC SET ZONE DETECTION INTERVAL: 350 ms
MDC IDC STAT BRADY RV PERCENT PACED: 53 %
Zone Setting Detection Interval: 320 ms

## 2014-09-20 NOTE — Progress Notes (Signed)
Remote ICD transmission.   

## 2014-09-21 ENCOUNTER — Ambulatory Visit (INDEPENDENT_AMBULATORY_CARE_PROVIDER_SITE_OTHER): Payer: Medicare Other | Admitting: Neurology

## 2014-09-21 DIAGNOSIS — E538 Deficiency of other specified B group vitamins: Secondary | ICD-10-CM

## 2014-09-21 MED ORDER — CYANOCOBALAMIN 1000 MCG/ML IJ SOLN
1000.0000 ug | Freq: Once | INTRAMUSCULAR | Status: AC
  Administered 2014-09-21: 1000 ug via INTRAMUSCULAR

## 2014-09-21 NOTE — Progress Notes (Signed)
B12 injection to left deltoid with no apparent complications.   

## 2014-09-24 ENCOUNTER — Encounter: Payer: Self-pay | Admitting: Cardiology

## 2014-09-25 ENCOUNTER — Other Ambulatory Visit: Payer: Medicare Other

## 2014-09-26 ENCOUNTER — Ambulatory Visit: Payer: Medicare Other | Admitting: Internal Medicine

## 2014-09-27 ENCOUNTER — Other Ambulatory Visit (INDEPENDENT_AMBULATORY_CARE_PROVIDER_SITE_OTHER): Payer: Medicare Other

## 2014-09-27 ENCOUNTER — Ambulatory Visit (INDEPENDENT_AMBULATORY_CARE_PROVIDER_SITE_OTHER): Payer: Medicare Other | Admitting: Family

## 2014-09-27 ENCOUNTER — Encounter: Payer: Self-pay | Admitting: Family

## 2014-09-27 ENCOUNTER — Telehealth: Payer: Self-pay | Admitting: Family

## 2014-09-27 VITALS — BP 144/78 | HR 71 | Temp 97.5°F | Resp 18 | Ht 64.5 in | Wt 160.2 lb

## 2014-09-27 DIAGNOSIS — N189 Chronic kidney disease, unspecified: Secondary | ICD-10-CM

## 2014-09-27 DIAGNOSIS — E785 Hyperlipidemia, unspecified: Secondary | ICD-10-CM

## 2014-09-27 DIAGNOSIS — Z23 Encounter for immunization: Secondary | ICD-10-CM

## 2014-09-27 DIAGNOSIS — I10 Essential (primary) hypertension: Secondary | ICD-10-CM

## 2014-09-27 DIAGNOSIS — E039 Hypothyroidism, unspecified: Secondary | ICD-10-CM

## 2014-09-27 DIAGNOSIS — Z0000 Encounter for general adult medical examination without abnormal findings: Secondary | ICD-10-CM | POA: Insufficient documentation

## 2014-09-27 LAB — BASIC METABOLIC PANEL WITH GFR
BUN: 18 mg/dL (ref 6–23)
CO2: 22 meq/L (ref 19–32)
Calcium: 9.4 mg/dL (ref 8.4–10.5)
Chloride: 112 meq/L (ref 96–112)
Creatinine, Ser: 2.3 mg/dL — ABNORMAL HIGH (ref 0.4–1.2)
GFR: 26.22 mL/min — ABNORMAL LOW
Glucose, Bld: 78 mg/dL (ref 70–99)
Potassium: 4.2 meq/L (ref 3.5–5.1)
Sodium: 141 meq/L (ref 135–145)

## 2014-09-27 LAB — LIPID PANEL
Cholesterol: 180 mg/dL (ref 0–200)
HDL: 76.9 mg/dL
LDL Cholesterol: 82 mg/dL (ref 0–99)
NonHDL: 103.1
Total CHOL/HDL Ratio: 2
Triglycerides: 104 mg/dL (ref 0.0–149.0)
VLDL: 20.8 mg/dL (ref 0.0–40.0)

## 2014-09-27 LAB — CBC
HCT: 30.5 % — ABNORMAL LOW (ref 36.0–46.0)
Hemoglobin: 9.9 g/dL — ABNORMAL LOW (ref 12.0–15.0)
MCHC: 32.4 g/dL (ref 30.0–36.0)
MCV: 89.4 fl (ref 78.0–100.0)
Platelets: 269 K/uL (ref 150.0–400.0)
RBC: 3.41 Mil/uL — ABNORMAL LOW (ref 3.87–5.11)
RDW: 17.5 % — ABNORMAL HIGH (ref 11.5–15.5)
WBC: 6.2 K/uL (ref 4.0–10.5)

## 2014-09-27 LAB — TSH: TSH: 0.22 u[IU]/mL — ABNORMAL LOW (ref 0.35–4.50)

## 2014-09-27 MED ORDER — LEVOTHYROXINE SODIUM 100 MCG PO TABS: 100.0000 ug | ORAL_TABLET | Freq: Every day | ORAL | Status: DC

## 2014-09-27 NOTE — Assessment & Plan Note (Signed)
1) Anticipatory Guidance: Discussed importance of wearing a seatbelt while driving and not texting while driving; changing batteries in smoke detector at least once annually; wearing suntan lotion when outside; eating a balanced and moderate diet; getting physical activity at least 30 minutes per day.  2) Immunizations / Screenings / Labs:  Patient received Prevnar vaccination today. Declined flu shot and shingles vaccination. All other immunizations are up-to-date per recommendations. All screenings are up-to-date per recommendations. btain CBC, BMET, Lipid profile and TSH.   Overall well exam. Patient does have difficulties hearing and wears hearing aids for that. She is being followed by an audiologist. Continue current healthy lifestyle choices. Encourage activity to at least 30 minutes most days of the week. Follow up prevention exam in 1 year or sooner pending blood work.

## 2014-09-27 NOTE — Assessment & Plan Note (Signed)
Medical, surgical, family, and social histories were reviewed and updated as needed. Allergies and medications were reviewed and validated with the patient. Reviewed problem list and updated provider list. Evaluated activities of daily living, hearing, and depression screens. Plan of care discussed with patient and health information provided.

## 2014-09-27 NOTE — Telephone Encounter (Signed)
Please call patient and inform her that her thyroid hormone level was still low indicating that we need to decrease her levothyroxine.  The other abnormal value was her kidney function has decreased, although this can possibly be related to dehydration. I like her to follow up with labs in approximately 2 weeks and sure she drinks plenty of fluids during that time. There is no appointment that is necessary labs will be in the computer to recheck her kidney function. All of the other lab work is within the normal expected ranges for her.

## 2014-09-27 NOTE — Telephone Encounter (Signed)
Called pt and let her daughter know that her thyroid level was low and that her mom needed to decrease thyroid medication. She is also aware to come back and recheck kidney function in a couple of weeks.

## 2014-09-27 NOTE — Progress Notes (Signed)
Subjective:   Patient ID: Erika Rivera, female    DOB: Oct 30, 1935, 78 y.o.   MRN: [id number]   Chief Complaint  Patient presents with  . CPE    Not fasting     Erika Rivera is a 77 y.o. female who presents for Medicare Annual/Subsequent preventive examination.   PREVENTATIVE SCREENING-COUNSELING & MANAGEMENT  Problem List 1)  Hypertension - Currently maintained on benicar, propanolol, and amolodipine.   BP Readings from Last 3 Encounters:  09/27/14 144/78  06/21/14 139/60  06/20/14 132/68    2) Hypothyroidism - Currently maintained on levothyroxine 112 mcg.   Lab Results  Component Value Date   TSH 0.02* 01/09/2014   3) Memory - Currenlty maintain on donepazil   4) Hyperlipidemia - Currrently maintained on simvastatin   Allergies  Allergen Reactions  . Diphenhydramine Hcl Anaphylaxis    Tongue swelling  . Penicillins Rash   Current Outpatient Prescriptions on File Prior to Visit  Medication Sig Dispense Refill  . amLODipine (NORVASC) 10 MG tablet TAKE 1 TABLET (10 MG TOTAL) BY MOUTH DAILY. 90 tablet 1  . BENICAR 40 MG tablet TAKE 1 TABLET BY MOUTH EVERY DAY 90 tablet 3  . Calcium Carbonate-Vitamin D (CALCIUM + D PO) Take 1 capsule by mouth daily.     . Cholecalciferol (VITAMIN D3) 5000 UNITS TABS Take 5,000 Units by mouth daily. 30 tablet   . donepezil (ARICEPT) 5 MG tablet TAKE 1 TABLET (5 MG TOTAL) BY MOUTH AT BEDTIME. 30 tablet 5  . levothyroxine (SYNTHROID, LEVOTHROID) 112 MCG tablet Take 1 tablet (112 mcg total) by mouth daily before breakfast. 90 tablet 3  . potassium chloride (KLOR-CON M10) 10 MEQ tablet TAKE 1 TABLET BY MOUTH ONLY ON MON, WED, AND FRI. 90 tablet 2  . propranolol ER (INDERAL LA) 160 MG SR capsule TAKE 1 CAPSULE (160 MG TOTAL) BY MOUTH DAILY. 30 capsule 6  . simvastatin (ZOCOR) 20 MG tablet Take 1 tablet (20 mg total) by mouth daily at 6 PM. 90 tablet 3  . triamcinolone cream (KENALOG) 0.1 % Apply 1 application topically daily.      No current facility-administered medications on file prior to visit.     Screening Tests Health Maintenance  Topic Date Due  . ZOSTAVAX  05/19/1998  . INFLUENZA VACCINE  05/05/2014  . COLONOSCOPY  08/24/2016  . TETANUS/TDAP  10/23/2017  . DEXA SCAN  Completed  . PNEUMOCOCCAL POLYSACCHARIDE VACCINE AGE 73 AND OVER  Completed    Immunizations Immunization History  Administered Date(s) Administered  . Influenza Split 07/29/2011  . Influenza,inj,Quad PF,36+ Mos 09/22/2013  . Pneumococcal Polysaccharide-23 04/23/2011  Prevnar; declined flu and shingles shot    PAST HISTORY  Medical History Past Medical History  Diagnosis Date  . ARTHRITIS, HAND   . ARTHRITIS, LEFT SHOULDER   . HEARING LOSS   . SMOKER   . CEREBROVASCULAR ACCIDENT, HX OF   . HYPERTENSION   . HYPERLIPIDEMIA   . HYPOTHYROIDISM   . OVERACTIVE BLADDER   . Cerebral aneurysm     s/p clipping  . Polymorphic ventricular tachycardia     s/p [employer] Jude AICD (dual chamber) 12/2011;  Echo: mild LVH, EF 50-555, grade 1 diast dysfxn, trivial AI.   Marland Kitchen Torsade de pointes     PVC dependent  . Syncope     2/2 VTach  . Bradycardia     A paced by AICD  . Essential tremor   . Osteopenia 11/18/2012    10/14/12  DEXA @ [facility]: -2.2  . Allergy   . Arthritis   . CAD (coronary artery disease)     LHC 12/2011: RCA 75% (neg FFR), EF 55-65% - med Tx  . Dementia   . Vitamin B 12 deficiency   . Hypothyroidism   . Chronic kidney disease, stage III (moderate)   . High blood pressure with chronic kidney disease   . Stroke     With rupture of intra-cranial aneurysm  . Polymorphic ventricular tachycardia     s/p ICD placement  . Proteinuria   . Lupus     Pt reports "lupus of skin"    Family History Family History  Problem Relation Age of Onset  . Gout Father   . Kidney disease Father     kidney failure  . Arthritis Other   . Hypertension Other   . Kidney disease Other   . Heart disease Other   . Thyroid disease Other   .  Colon polyps Neg Hx   . Esophageal cancer Neg Hx   . Rectal cancer Neg Hx   . Stomach cancer Neg Hx     Social History History  Substance Use Topics  . Smoking status: Former Smoker -- 0.60 packs/day    Quit date: 11/28/2011  . 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 Alcohol Use: No     RISK FACTORS  Tobacco History  Smoking status  . Former Smoker -- 0.60 packs/day  . Quit date: 11/28/2011  Smokeless tobacco  . Never Used    Comment: Widowed, lives with dtr. retired from [employer] (new[employer])   Diet -  Exercise -   Cardiac risk factors: advanced age (older than 63 for men, 65 for women), dyslipidemia and hypertension.  Depression Screen  Q1: Over the past two weeks, have you felt down, depressed or hopeless? No  Q2: Over the past two weeks, have you felt little interest or pleasure in doing things? No  Have you lost interest or pleasure in daily life? No  Do you often feel hopeless? No  Do you cry easily over simple problems? No  Activities of Daily Living In your present state of health, do you have any difficulty performing the following activities?:  Driving? No Managing money?  No Feeding yourself? No Getting from bed to chair? No Climbing a flight of stairs? No Preparing food and eating?: No Bathing or showering? Yes - assisted by family  Getting dressed: No Getting to the toilet? No Using the toilet: No Moving around from place to place: No In the past year have you fallen or had a near fall?:No   Home Safety Has smoke detector and wears seat belts. No firearms. No excess sun exposure. Are there smokers in your home (other than you)?  No  Hearing Difficulties: Yes - wears hearing aids Do you often ask people to speak up or repeat themselves? Yes Do you experience ringing or noises in your ears? No  Do you have difficulty understanding soft or whispered voices?  Yes  Do you feel that you have a problem with memory? Occasionally  Do you often misplace items? No  Do you feel safe at home?  Yes  Cognitive Testing  Alert? Yes   Normal Appearance? Yes  Oriented to person? Yes  Place? Yes   Time? Yes  Recall of three objects?  Yes  Can perform simple calculations? Yes  Displays appropriate judgment? Yes  Can read the correct time from a watch face? Yes   Advanced Directives have been discussed with the patient? yes Current Physicians/Providers and Suppliers  1. Dr. Rayann Heman - Cardiology 2. Dr. Posey Pronto - Nephrology  3. Dr. Randa Evens - Podiatry 4. Dr. Carles Collet - Neurlogy 5. Dr. Romeo Rabon - Audiology  Indicate any recent Medical Services you may have received from other than [facility] providers in the past year (date may be approximate).  All answers were reviewed with the patient and necessary referrals were made:  Mauricio Po, [place]   09/27/2014    Review of Systems Constitutional: Denies fever, chills, fatigue, or significant weight gain/loss. HENT: Head: Denies headache or neck pain Ears: Denies changes in hearing, ringing in ears, earache, drainage Nose: Denies discharge, stuffiness, itching, nosebleed, sinus pain Throat: Denies sore throat, hoarseness, dry mouth, sores, thrush Eyes: Denies loss/changes in vision, pain, redness, blurry/double vision, flashing lights Cardiovascular: Denies chest pain/discomfort, tightness, palpitations, shortness of breath with activity, difficulty lying down, swelling, sudden awakening with shortness of breath Respiratory: Denies shortness of breath, cough, sputum production, wheezing Gastrointestinal: Denies dysphasia, heartburn, change in appetite, nausea, change in bowel habits, rectal bleeding, constipation, diarrhea, yellow skin or eyes Genitourinary: Denies frequency, urgency, burning/pain, blood in urine, incontinence, change in urinary strength. Musculoskeletal: Denies muscle/joint pain, stiffness, back pain,  redness or swelling of joints, trauma Skin: Denies rashes, lumps, itching, dryness, color changes, or hair/nail changes Neurological: Denies dizziness, fainting, seizures, weakness, numbness, tingling, tremor Psychiatric - Denies nervousness, stress, depression or memory loss Endocrine: Denies heat or cold intolerance, sweating, frequent urination, excessive thirst, changes in appetite Hematologic: Denies ease of bruising or bleeding   Objective:     BP 144/78 mmHg  Pulse 71  Temp(Src) 97.5 F (36.4 C) (Oral)  Resp 18  Ht 5' 4.5" (1.638 m)  Wt 160 lb 3.2 oz (72.666 kg)  BMI 27.08 kg/m2  SpO2 97%  Physical Exam  Constitutional: She is oriented to person, place, and time. No distress.  HENT:  Right Ear: External ear normal.  Left Ear: External ear normal.  Nose: Nose normal.  Mouth/Throat: Oropharynx is clear and moist. Abnormal dentition.  Eyes: Conjunctivae and EOM are normal. Pupils are equal, round, and reactive to light.  Arcus senilis present noted bilateral eyes  Neck: Normal range of motion. Neck supple. No JVD present. No tracheal deviation present. No thyromegaly present.  Cardiovascular: Normal rate, regular rhythm, normal heart sounds and intact distal pulses.   Respiratory: Effort normal and breath sounds normal. No respiratory distress. She has no wheezes. She has no rales. She exhibits no tenderness.  GI: Soft. Bowel sounds are normal. She exhibits no distension and no mass. There is no tenderness. There is no rebound and no guarding.  Musculoskeletal: Normal range of motion.  Lymphadenopathy:    She has no cervical adenopathy.  Neurological: She is alert and oriented to person, place, and time. She has normal reflexes. No cranial nerve deficit. Coordination normal.  Skin: Skin is warm and dry.  Psychiatric: She has a normal mood and affect. Her behavior is normal. Judgment and thought content normal.       Assessment:      See Plan Below      Plan:      During the course of the visit the patient was educated and counseled about appropriate screening and preventive services including:    Pneumococcal vaccine   Diet review for nutrition referral? Yes ____  Not Indicated  _X___   Patient Instructions (the written plan) was given to the patient.  Medicare Attestation I have personally reviewed: The patient's medical and social history Their use of alcohol, tobacco or illicit drugs Their current medications and supplements The patient's functional ability including ADLs,fall risks, home safety risks, cognitive, and hearing and visual impairment Diet and physical activities Evidence for depression or mood disorders  The patient's weight, height, BMI,  have been recorded in the chart.  I have made referrals, counseling, and provided education to the patient based on review of the above and I have provided the patient with a written personalized care plan for preventive services.     Mauricio Po, FNP   09/27/2014

## 2014-09-27 NOTE — Progress Notes (Signed)
Pre visit review using our clinic review tool, if applicable. No additional management support is needed unless otherwise documented below in the visit note. 

## 2014-09-27 NOTE — Patient Instructions (Signed)
Thank you for choosing [facility].  Summary/Instructions:  Please stop by the lab on the basement level of the building for your blood work. Your results will be released to MyChart (or called to you) after review, usually within 72hours after test completion. If any changes need to be made, you will be notified at that same time.  Health Maintenance Adopting a healthy lifestyle and getting preventive care can go a long way to promote health and wellness. Talk with your health care provider about what schedule of regular examinations is right for you. This is a good chance for you to check in with your provider about disease prevention and staying healthy. In between checkups, there are plenty of things you can do on your own. Experts have done a lot of research about which lifestyle changes and preventive measures are most likely to keep you healthy. Ask your health care provider for more information. WEIGHT AND DIET  Eat a healthy diet  Be sure to include plenty of vegetables, fruits, low-fat dairy products, and lean protein.  Do not eat a lot of foods high in solid fats, added sugars, or salt.  Get regular exercise. This is one of the most important things you can do for your health.  Most adults should exercise for at least 150 minutes each week. The exercise should increase your heart rate and make you sweat (moderate-intensity exercise).  Most adults should also do strengthening exercises at least twice a week. This is in addition to the moderate-intensity exercise.  Maintain a healthy weight  Body mass index (BMI) is a measurement that can be used to identify possible weight problems. It estimates body fat based on height and weight. Your health care provider can help determine your BMI and help you achieve or maintain a healthy weight.  For females 20 years of age and older:   A BMI below 18.5 is considered underweight.  A BMI of 18.5 to 24.9 is normal.  A BMI of 25  to 29.9 is considered overweight.  A BMI of 30 and above is considered obese.  Watch levels of cholesterol and blood lipids  You should start having your blood tested for lipids and cholesterol at 76 years of age, then have this test every 5 years.  You may need to have your cholesterol levels checked more often if:  Your lipid or cholesterol levels are high.  You are older than 76 years of age.  You are at high risk for heart disease.  CANCER SCREENING   Lung Cancer  Lung cancer screening is recommended for adults 55-80 years old who are at high risk for lung cancer because of a history of smoking.  A yearly low-dose CT scan of the lungs is recommended for people who:  Currently smoke.  Have quit within the past 15 years.  Have at least a 30-pack-year history of smoking. A pack year is smoking an average of one pack of cigarettes a day for 1 year.  Yearly screening should continue until it has been 15 years since you quit.  Yearly screening should stop if you develop a health problem that would prevent you from having lung cancer treatment.  Breast Cancer  Practice breast self-awareness. This means understanding how your breasts normally appear and feel.  It also means doing regular breast self-exams. Let your health care provider know about any changes, no matter how small.  If you are in your 20s or 30s, you should have a clinical breast exam (  CBE) by a health care provider every 1-3 years as part of a regular health exam.  If you are 40 or older, have a CBE every year. Also consider having a breast X-ray (mammogram) every year.  If you have a family history of breast cancer, talk to your health care provider about genetic screening.  If you are at high risk for breast cancer, talk to your health care provider about having an MRI and a mammogram every year.  Breast cancer gene (BRCA) assessment is recommended for women who have family members with BRCA-related  cancers. BRCA-related cancers include:  Breast.  Ovarian.  Tubal.  Peritoneal cancers.  Results of the assessment will determine the need for genetic counseling and BRCA1 and BRCA2 testing. Cervical Cancer Routine pelvic examinations to screen for cervical cancer are no longer recommended for nonpregnant women who are considered low risk for cancer of the pelvic organs (ovaries, uterus, and vagina) and who do not have symptoms. A pelvic examination may be necessary if you have symptoms including those associated with pelvic infections. Ask your health care provider if a screening pelvic exam is right for you.   The Pap test is the screening test for cervical cancer for women who are considered at risk.  If you had a hysterectomy for a problem that was not cancer or a condition that could lead to cancer, then you no longer need Pap tests.  If you are older than 65 years, and you have had normal Pap tests for the past 10 years, you no longer need to have Pap tests.  If you have had past treatment for cervical cancer or a condition that could lead to cancer, you need Pap tests and screening for cancer for at least 20 years after your treatment.  If you no longer get a Pap test, assess your risk factors if they change (such as having a new sexual partner). This can affect whether you should start being screened again.  Some women have medical problems that increase their chance of getting cervical cancer. If this is the case for you, your health care provider may recommend more frequent screening and Pap tests.  The human papillomavirus (HPV) test is another test that may be used for cervical cancer screening. The HPV test looks for the virus that can cause cell changes in the cervix. The cells collected during the Pap test can be tested for HPV.  The HPV test can be used to screen women 30 years of age and older. Getting tested for HPV can extend the interval between normal Pap tests from  three to five years.  An HPV test also should be used to screen women of any age who have unclear Pap test results.  After 76 years of age, women should have HPV testing as often as Pap tests.  Colorectal Cancer  This type of cancer can be detected and often prevented.  Routine colorectal cancer screening usually begins at 76 years of age and continues through 75 years of age.  Your health care provider may recommend screening at an earlier age if you have risk factors for colon cancer.  Your health care provider may also recommend using home test kits to check for hidden blood in the stool.  A small camera at the end of a tube can be used to examine your colon directly (sigmoidoscopy or colonoscopy). This is done to check for the earliest forms of colorectal cancer.  Routine screening usually begins at age 50.    Direct examination of the colon should be repeated every 5-10 years through 75 years of age. However, you may need to be screened more often if early forms of precancerous polyps or small growths are found. Skin Cancer  Check your skin from head to toe regularly.  Tell your health care provider about any new moles or changes in moles, especially if there is a change in a mole's shape or color.  Also tell your health care provider if you have a mole that is larger than the size of a pencil eraser.  Always use sunscreen. Apply sunscreen liberally and repeatedly throughout the day.  Protect yourself by wearing long sleeves, pants, a wide-brimmed hat, and sunglasses whenever you are outside. HEART DISEASE, DIABETES, AND HIGH BLOOD PRESSURE   Have your blood pressure checked at least every 1-2 years. High blood pressure causes heart disease and increases the risk of stroke.  If you are between 55 years and 79 years old, ask your health care provider if you should take aspirin to prevent strokes.  Have regular diabetes screenings. This involves taking a blood sample to check  your fasting blood sugar level.  If you are at a normal weight and have a low risk for diabetes, have this test once every three years after 76 years of age.  If you are overweight and have a high risk for diabetes, consider being tested at a younger age or more often. PREVENTING INFECTION  Hepatitis B  If you have a higher risk for hepatitis B, you should be screened for this virus. You are considered at high risk for hepatitis B if:  You were born in a country where hepatitis B is common. Ask your health care provider which countries are considered high risk.  Your parents were born in a high-risk country, and you have not been immunized against hepatitis B (hepatitis B vaccine).  You have HIV or AIDS.  You use needles to inject street drugs.  You live with someone who has hepatitis B.  You have had sex with someone who has hepatitis B.  You get hemodialysis treatment.  You take certain medicines for conditions, including cancer, organ transplantation, and autoimmune conditions. Hepatitis C  Blood testing is recommended for:  Everyone born from 1945 through 1965.  Anyone with known risk factors for hepatitis C. Sexually transmitted infections (STIs)  You should be screened for sexually transmitted infections (STIs) including gonorrhea and chlamydia if:  You are sexually active and are younger than 76 years of age.  You are older than 76 years of age and your health care provider tells you that you are at risk for this type of infection.  Your sexual activity has changed since you were last screened and you are at an increased risk for chlamydia or gonorrhea. Ask your health care provider if you are at risk.  If you do not have HIV, but are at risk, it may be recommended that you take a prescription medicine daily to prevent HIV infection. This is called pre-exposure prophylaxis (PrEP). You are considered at risk if:  You are sexually active and do not regularly use  condoms or know the HIV status of your partner(s).  You take drugs by injection.  You are sexually active with a partner who has HIV. Talk with your health care provider about whether you are at high risk of being infected with HIV. If you choose to begin PrEP, you should first be tested for HIV. You should then be   tested every 3 months for as long as you are taking PrEP.  PREGNANCY   If you are premenopausal and you may become pregnant, ask your health care provider about preconception counseling.  If you may become pregnant, take 400 to 800 micrograms (mcg) of folic acid every day.  If you want to prevent pregnancy, talk to your health care provider about birth control (contraception). OSTEOPOROSIS AND MENOPAUSE   Osteoporosis is a disease in which the bones lose minerals and strength with aging. This can result in serious bone fractures. Your risk for osteoporosis can be identified using a bone density scan.  If you are 65 years of age or older, or if you are at risk for osteoporosis and fractures, ask your health care provider if you should be screened.  Ask your health care provider whether you should take a calcium or vitamin D supplement to lower your risk for osteoporosis.  Menopause may have certain physical symptoms and risks.  Hormone replacement therapy may reduce some of these symptoms and risks. Talk to your health care provider about whether hormone replacement therapy is right for you.  HOME CARE INSTRUCTIONS   Schedule regular health, dental, and eye exams.  Stay current with your immunizations.   Do not use any tobacco products including cigarettes, chewing tobacco, or electronic cigarettes.  If you are pregnant, do not drink alcohol.  If you are breastfeeding, limit how much and how often you drink alcohol.  Limit alcohol intake to no more than 1 drink per day for nonpregnant women. One drink equals 12 ounces of beer, 5 ounces of wine, or 1 ounces of hard  liquor.  Do not use street drugs.  Do not share needles.  Ask your health care provider for help if you need support or information about quitting drugs.  Tell your health care provider if you often feel depressed.  Tell your health care provider if you have ever been abused or do not feel safe at home. Document Released: 04/06/2011 Document Revised: 02/05/2014 Document Reviewed: 08/23/2013 [employer] Patient Information 2015 [employer]. This information is not intended to replace advice given to you by your health care provider. Make sure you discuss any questions you have with your health care provider.  

## 2014-09-27 NOTE — Assessment & Plan Note (Signed)
Last TSH was noted to be low. Obtain TSH to determine potential reduction in level thyroxine if needed.

## 2014-10-12 ENCOUNTER — Ambulatory Visit (INDEPENDENT_AMBULATORY_CARE_PROVIDER_SITE_OTHER): Payer: Commercial Managed Care - HMO | Admitting: Neurology

## 2014-10-12 DIAGNOSIS — E538 Deficiency of other specified B group vitamins: Secondary | ICD-10-CM

## 2014-10-12 MED ORDER — CYANOCOBALAMIN 1000 MCG/ML IJ SOLN
1000.0000 ug | Freq: Once | INTRAMUSCULAR | Status: AC
  Administered 2014-10-12: 1000 ug via INTRAMUSCULAR

## 2014-10-12 NOTE — Progress Notes (Signed)
B12 injection to right deltoid with no apparent complications.   

## 2014-10-29 ENCOUNTER — Telehealth: Payer: Self-pay | Admitting: Internal Medicine

## 2014-10-29 NOTE — Telephone Encounter (Signed)
Questions regarding home visits

## 2014-10-31 NOTE — Telephone Encounter (Signed)
Returned call,LMOVM for Lanette to call back with details on question/unsure what is needed.

## 2014-11-09 ENCOUNTER — Encounter: Payer: Self-pay | Admitting: Internal Medicine

## 2014-11-09 ENCOUNTER — Other Ambulatory Visit (INDEPENDENT_AMBULATORY_CARE_PROVIDER_SITE_OTHER): Payer: Commercial Managed Care - HMO

## 2014-11-09 ENCOUNTER — Ambulatory Visit (INDEPENDENT_AMBULATORY_CARE_PROVIDER_SITE_OTHER): Payer: Commercial Managed Care - HMO | Admitting: Internal Medicine

## 2014-11-09 ENCOUNTER — Ambulatory Visit (INDEPENDENT_AMBULATORY_CARE_PROVIDER_SITE_OTHER): Payer: Commercial Managed Care - HMO | Admitting: Neurology

## 2014-11-09 ENCOUNTER — Encounter: Payer: Self-pay | Admitting: Neurology

## 2014-11-09 VITALS — BP 130/74 | HR 70 | Ht 64.5 in | Wt 160.2 lb

## 2014-11-09 VITALS — BP 136/76 | HR 70 | Temp 97.6°F | Ht 65.0 in | Wt 163.8 lb

## 2014-11-09 DIAGNOSIS — N183 Chronic kidney disease, stage 3 unspecified: Secondary | ICD-10-CM

## 2014-11-09 DIAGNOSIS — M21961 Unspecified acquired deformity of right lower leg: Secondary | ICD-10-CM

## 2014-11-09 DIAGNOSIS — F0280 Dementia in other diseases classified elsewhere without behavioral disturbance: Secondary | ICD-10-CM

## 2014-11-09 DIAGNOSIS — I10 Essential (primary) hypertension: Secondary | ICD-10-CM

## 2014-11-09 DIAGNOSIS — G309 Alzheimer's disease, unspecified: Secondary | ICD-10-CM

## 2014-11-09 DIAGNOSIS — E038 Other specified hypothyroidism: Secondary | ICD-10-CM

## 2014-11-09 DIAGNOSIS — E785 Hyperlipidemia, unspecified: Secondary | ICD-10-CM

## 2014-11-09 DIAGNOSIS — M21962 Unspecified acquired deformity of left lower leg: Secondary | ICD-10-CM

## 2014-11-09 DIAGNOSIS — R198 Other specified symptoms and signs involving the digestive system and abdomen: Secondary | ICD-10-CM

## 2014-11-09 DIAGNOSIS — R194 Change in bowel habit: Secondary | ICD-10-CM

## 2014-11-09 DIAGNOSIS — E538 Deficiency of other specified B group vitamins: Secondary | ICD-10-CM

## 2014-11-09 DIAGNOSIS — L601 Onycholysis: Secondary | ICD-10-CM

## 2014-11-09 LAB — HEPATIC FUNCTION PANEL
ALBUMIN: 2.7 g/dL — AB (ref 3.5–5.2)
ALK PHOS: 54 U/L (ref 39–117)
ALT: 5 U/L (ref 0–35)
AST: 16 U/L (ref 0–37)
Bilirubin, Direct: 0.1 mg/dL (ref 0.0–0.3)
TOTAL PROTEIN: 6.6 g/dL (ref 6.0–8.3)
Total Bilirubin: 0.3 mg/dL (ref 0.2–1.2)

## 2014-11-09 LAB — TSH: TSH: 0.81 u[IU]/mL (ref 0.35–4.50)

## 2014-11-09 MED ORDER — CYANOCOBALAMIN 1000 MCG/ML IJ SOLN
1000.0000 ug | Freq: Once | INTRAMUSCULAR | Status: AC
  Administered 2014-11-09: 1000 ug via INTRAMUSCULAR

## 2014-11-09 NOTE — Addendum Note (Signed)
Addended by: Chester Holstein on: 11/09/2014 11:14 AM   Modules accepted: Orders

## 2014-11-09 NOTE — Progress Notes (Signed)
   Subjective:    Patient ID: Erika Rivera, female    DOB: [birth year], 79 y.o.   MRN: [id number]  HPI She has had bowel changes for over 2 months which are described as loose-watery. She will have this for up to 4 times a day up to 4 days a week. It is not constant phenomena.  She's had no triggers such as exposure to sick children or pets. She's had no travel exposure.   No reported exposure to suspicious food or liquids.   She has not treated this in any form or fashion.  She's had no antibiotics the last 6-12 weeks.  She has lost 7 pounds over the last several months.  Her chart was reviewed. It appears that she may be on an ACE inhibitor as well as an ARB; her daughter states that the kidney specialist, Dr. Posey Pronto recommended this. This will need to be verified.  She also has a history of osteopenia and polymorphic ventricular tachycardia; her most recent TSH was 0.22. Her daughter states that her thyroid dose was decreased based on that lab result.  Lipids were excellent in December; there are no current liver function test on record.  Review of Systems  There is no associated nausea, vomiting, dyspepsia, abdominal pain, hematemesis, melena, or rectal bleeding.  She has no associated pain.   She has no associated lightheadedness or decreased urine output. She has no dysuria, pyuria, or hematuria.  Podiatry referral needed to treat onycholysis as insurance has changed     Objective:   Physical Exam  General appearance is one of good health and nourishment w/o distress. As per CDC Guidelines ,Epic documents her being overweight. Patchy, irregularalopecia present.  Eyes: No conjunctival inflammation or scleral icterus is present.  Oral exam: Dental hygiene is poor with diffuse plaque; lips and gums are healthy appearing.There is no oropharyngeal erythema or exudate noted.   Ears: hearing aids bilaterally  Heart:  Normal rate and regular rhythm. S1 and S2 normal  without gallop, murmur, click, rub or other extra sounds     Lungs:Chest clear to auscultation; no wheezes, rhonchi,rales ,or rubs present.No increased work of breathing.   Abdomen: bowel sounds normal, soft and non-tender without masses, organomegaly or hernias noted.  No guarding or rebound . No tenderness over the flanks to percussion  Musculoskeletal:  Nail clubbing of hands. Able to lie flat and sit up without help. Negative straight leg raising bilaterally.   Skin:Warm & dry.  Intact without suspicious lesions or rashes ; no  Tenting. Feet very dry.Deformed,thickened  Toenails  Vasc: decreased PTP. Tense edema of feet  Lymphatic: No lymphadenopathy is noted about the head, neck, axilla              Assessment & Plan:  #1 loose - watery stools intermittently  #2 See Current Assessment & Plan in Problem List under specific Diagnosis

## 2014-11-09 NOTE — Assessment & Plan Note (Signed)
TSH 

## 2014-11-09 NOTE — Progress Notes (Signed)
Erika Rivera was seen today in the movement disorders clinic for neurologic f/u for tremor and memory loss.   This patient is accompanied in the office by her child who supplements the history.   I weaned her off the L-dopa because I did not think that she was parkinsonian.  She has noticed no difference off of the medication compared to when she was on the medication.  She continues to have tremor that is most bothersome when she goes to pour something or when she eats.  She remains on propranolol 160 mg daily.  She was started on primidone last visit.  She is having no SE but she doesn't think it has helped.  05/31/13 update:    The patient is accompanied by her daughter who supplements the history.  Her memory is about the same.  Sometimes, her daughter states that she will revert back to "her childhood." She is not exercising.  She sits and watches TV all day and may do some "puzzles."  She will sometimes with water on. Her daughter does the cooking and driving.  Her daughter does state that she has trouble walking long distances and would like to see if they can get a handicap sticker.  The patient herself does not drive.  Last visit, her primidone was increased for tremor.  Her tremor has been markedly improved.  She does have a history of B12 deficiency and has been getting B12 injections faithfully.  11/10/13:  This patient is accompanied in the office by her child who supplements the history.  The patient has a history of dementia.  Memory has been about the same.  She is on Aricept, 5 mg which was started last August.  She is tolerating it well.  She continues to receive monthly B12 injections.  She continues to have some degree of tremor, but her daughter states that she rarely complains about it.  Today in the office, however, she complains that she has difficulty doing her crossword puzzles because of it.  Her daughter states that she does not notice any limitations because of the  tremor.  05/11/2014 update: Patient presents today for followup, accompanied by her daughter who supplements the history.  Patient has a history of Alzheimer's dementia.  Last visit, her Aricept was increased to 10 mg. Memory has been stable. I have been trying to wean her primidone for tremor. Still has tremor but is no worse with the wean. She is currently on 50 mg once per day.  She continues to receive B12 injections monthly for B12 deficiency.  11/09/13 update:  Pt returns for f/u, accompanied by her daughter who supplements the history.  Patient has AD.  She is on Aricept and doing well.  Memory has been stable.  I have weaned her off of her primidone.  She is still receiving her B12 injections for memory.  She has had no falls.  She isn't doing much acitivity other than staying in her room watching TV.  Her daughter would like to try to get her in the adult day care center 2 days a week.    There is no fam hx of tremor.   PREVIOUS MEDICATIONS: Sinemet  ALLERGIES:   Allergies  Allergen Reactions  . Diphenhydramine Hcl Anaphylaxis    Tongue swelling  . Penicillins Rash    CURRENT MEDICATIONS:  Current Outpatient Prescriptions on File Prior to Visit  Medication Sig Dispense Refill  . amLODipine (NORVASC) 10 MG tablet TAKE 1 TABLET (10 MG  TOTAL) BY MOUTH DAILY. 90 tablet 1  . BENICAR 40 MG tablet TAKE 1 TABLET BY MOUTH EVERY DAY 90 tablet 3  . Calcium Carbonate-Vitamin D (CALCIUM + D PO) Take 1 capsule by mouth daily.     . Cholecalciferol (VITAMIN D3) 5000 UNITS TABS Take 5,000 Units by mouth daily. 30 tablet   . donepezil (ARICEPT) 5 MG tablet TAKE 1 TABLET (5 MG TOTAL) BY MOUTH AT BEDTIME. 30 tablet 5  . levothyroxine (SYNTHROID, LEVOTHROID) 100 MCG tablet Take 1 tablet (100 mcg total) by mouth daily. 90 tablet 3  . potassium chloride (KLOR-CON M10) 10 MEQ tablet TAKE 1 TABLET BY MOUTH ONLY ON MON, WED, AND FRI. 90 tablet 2  . propranolol ER (INDERAL LA) 160 MG SR capsule TAKE 1  CAPSULE (160 MG TOTAL) BY MOUTH DAILY. 30 capsule 6  . simvastatin (ZOCOR) 20 MG tablet Take 1 tablet (20 mg total) by mouth daily at 6 PM. 90 tablet 3  . triamcinolone cream (KENALOG) 0.1 % Apply 1 application topically daily.     No current facility-administered medications on file prior to visit.    PAST MEDICAL HISTORY:   Past Medical History  Diagnosis Date  . ARTHRITIS, HAND   . ARTHRITIS, LEFT SHOULDER   . HEARING LOSS   . SMOKER   . CEREBROVASCULAR ACCIDENT, HX OF   . HYPERTENSION   . HYPERLIPIDEMIA   . HYPOTHYROIDISM   . OVERACTIVE BLADDER   . Cerebral aneurysm     s/p clipping  . Polymorphic ventricular tachycardia     s/p St Jude AICD (dual chamber) 12/2011;  Echo: mild LVH, EF 50-555, grade 1 diast dysfxn, trivial AI.   Marland Kitchen Torsade de pointes     PVC dependent  . Syncope     2/2 VTach  . Bradycardia     A paced by AICD  . Essential tremor   . Osteopenia 11/18/2012    10/14/12 DEXA @ [facility]: -2.2  . Allergy   . Arthritis   . CAD (coronary artery disease)     LHC 12/2011: RCA 75% (neg FFR), EF 55-65% - med Tx  . Dementia   . Vitamin B 12 deficiency   . Hypothyroidism   . Chronic kidney disease, stage III (moderate)   . High blood pressure with chronic kidney disease   . Stroke     With rupture of intra-cranial aneurysm  . Polymorphic ventricular tachycardia     s/p ICD placement  . Proteinuria   . Lupus     Pt reports "lupus of skin"    PAST SURGICAL HISTORY:   Past Surgical History  Procedure Laterality Date  . Cardiac defibrillator placement  12/07/11    SJM ICD implanted for recurrent Torsades with syncope  . Aneursym clipping      cerebral  . Left heart catheterization with coronary angiogram N/A 12/07/2011    Procedure: LEFT HEART CATHETERIZATION WITH CORONARY ANGIOGRAM;  Surgeon: Sherren Mocha, MD;  Location: [facility] CATH LAB;  Service: Cardiovascular;  Laterality: N/A;  . Implantable cardioverter defibrillator implant N/A 12/07/2011    Procedure: IMPLANTABLE  CARDIOVERTER DEFIBRILLATOR IMPLANT;  Surgeon: Thompson Grayer, MD;  Location: [facility] CATH LAB;  Service: Cardiovascular;  Laterality: N/A;    SOCIAL HISTORY:   History   Social History  . Marital Status: Widowed    Spouse Name: N/A    Number of Children: 2  . Years of Education: 14   Occupational History  . Not on file.   Social History  Main Topics  . Smoking status: Former Smoker -- 0.60 packs/day    Quit date: 11/28/2011  . 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 Alcohol Use: No  . Drug Use: No  . Sexual Activity: Not on file   Other Topics Concern  . Not on file   Social History Narrative    FAMILY HISTORY:   Family Status  Relation Status Death Age  . Father Deceased     renal failure  . Mother Deceased     unknown cause of death  . Sister Alive     unknown medical health  . Brother Alive     healthy  . Brother Deceased     2, one at birth w/ hydrocephalous  . Child Deceased     CA  . Child Alive     alive and well    ROS:  A complete 10 system review of systems was obtained and was unremarkable apart from what is mentioned above.  PHYSICAL EXAMINATION:    VITALS:   Filed Vitals:   11/09/14 1023  BP: 130/74  Pulse: 70  Height: 5' 4.5" (1.638 m)  Weight: 160 lb 3 oz (72.661 kg)  SpO2: 98%    GEN:  The patient appears stated age and is in NAD. HEENT:  Normocephalic, atraumatic.  The mucous membranes are moist. The superficial temporal arteries are without ropiness or tenderness. CV:  RRR Lungs:  CTAB Neck/HEME:  There are no carotid bruits bilaterally.  Neurological examination:  Orientation: A MoCA was done 11/10/13 and the pt scored 13/30.  She scores 4/4 on the clock drawing today.  She states it is 11/08/13 and it is 11/09/14. Cranial nerves: There is good facial symmetry.  Extraocular muscles are intact. The visual fields are full to confrontational testing. The speech is  fluent and clear. Soft palate rises symmetrically and there is no tongue deviation. Hearing is decreased to conversational tone. Sensation: Sensation is intact to light touch throughout. Motor: Strength is 5/5 in the bilateral upper and lower extremities.   Shoulder shrug is equal and symmetric.  There is no pronator drift.   Movement examination: Tone: There is normal tone in the bilateral upper extremities.  The tone in the lower extremities is normal.  Abnormal movements: There is tremor on the R with the outstretched hand and mild tremor on the R only with clock drawing. Coordination:  There is no decremation with RAM's in the hands or feet, tested with alternating supination/pronation, finger taps, hand opening and closing and heel and toe taps. Gait and Station: The patient has minimal difficulty arising out of a deep-seated chair without the use of the hands. Gait is steady.  LABS:   Lab Results  Component Value Date   VITAMINB12 288 07/22/2012      ASSESSMENT:   1.  Tremor.  I see no evidence of idiopathic PD or any of the parkinsonian syndromes.  Tremor may be due to prior brain injury from aneurysm and resulting encephalomalacia.  She is off of carbidopa/levodopa without changes from previous. 2.  Memory loss.  I suspect that this represents a dementia of the Alzheimers type but it could be vascular in nature. 3.  Mild B12 deficiency. 4.  Hx of cerebral aneurysm greater than 20 years ago.  PLAN:   1.  We discussed the diagnosis as well as pathophysiology of the disease.  We discussed treatment  options as well as prognostic indicators.  Patient education was provided. 2.  Because of memory, I stopped the primidone and she is doing fine.  Tremor has been mild and not been worsening with d/c of the primidone.   3. Her next  Monthly B12 injection is due today and she received that 4.  continue aricept 10 mg daily.  Risks, benefits, side effects and alternative therapies were  discussed.  The opportunity to ask questions was given and they were answered to the best of my ability.  The patient expressed understanding and willingness to follow the outlined treatment protocols. 5. Return in about 6 months (around 05/10/2015).

## 2014-11-09 NOTE — Progress Notes (Signed)
Patient in for B12 injection. 

## 2014-11-09 NOTE — Progress Notes (Signed)
Pre visit review using our clinic review tool, if applicable. No additional management support is needed unless otherwise documented below in the visit note. 

## 2014-11-09 NOTE — Patient Instructions (Signed)
Your next office appointment will be determined based upon review of your pending labs. Those instructions will be transmitted to you  by mail Critical values will be called   Followup as needed for your acute issue. Please report any significant change in your symptoms.   Take Florastor OR Align , a Pro Biotic , daily if stools are loose to replace the normal bacteria which should be in the gut. Immodium AD for frankly watery stool. Report increasing pain, fever or rectal bleeding.  Please bring actual pill bottles to all appointments. Verify the name and exact dose of all medicines and the prescribing physician. Please verify with Dr. Posey Pronto that he wants her on both Benicar and the lisinopril.

## 2014-11-10 NOTE — Assessment & Plan Note (Signed)
Update LFTs because of statin therapy

## 2014-11-10 NOTE — Assessment & Plan Note (Signed)
See entry under CKD

## 2014-11-10 NOTE — Assessment & Plan Note (Signed)
Verify that Dr Posey Pronto supports combined ARB & ACE-I therapy

## 2014-11-20 ENCOUNTER — Other Ambulatory Visit: Payer: Medicare Other

## 2014-11-20 ENCOUNTER — Telehealth: Payer: Self-pay | Admitting: Internal Medicine

## 2014-11-20 NOTE — Telephone Encounter (Signed)
Daughter called stated we suppose to get an autherization number for Erika Rivera to go see the foot doctor (pt already see that doctor, just change insurance to [facility] now). Please give her a call

## 2014-11-20 NOTE — Telephone Encounter (Signed)
[employer] referral completed. Auth # [id number] valid 11/20/2014 - 05/19/2015 for 6 visits. Left message for patient to call office.

## 2014-11-29 ENCOUNTER — Encounter: Payer: Self-pay | Admitting: Internal Medicine

## 2014-12-04 ENCOUNTER — Ambulatory Visit (INDEPENDENT_AMBULATORY_CARE_PROVIDER_SITE_OTHER): Payer: Commercial Managed Care - HMO

## 2014-12-04 DIAGNOSIS — Q828 Other specified congenital malformations of skin: Secondary | ICD-10-CM

## 2014-12-04 DIAGNOSIS — M79676 Pain in unspecified toe(s): Secondary | ICD-10-CM

## 2014-12-04 DIAGNOSIS — M79673 Pain in unspecified foot: Secondary | ICD-10-CM

## 2014-12-04 DIAGNOSIS — B351 Tinea unguium: Secondary | ICD-10-CM

## 2014-12-04 NOTE — Progress Notes (Signed)
   Subjective:    Patient ID: Lenor Coffin, female    DOB: Sep 17, 1936, 79 y.o.   MRN: [id number]  HPI  Pt presents for nail debridement  Review of Systems no new findings or systemic changes noted     Objective:   Physical Exam  Neurovascular status unchanged pedal pulses palpable DP and PT one over 4 bilateral Refill time 3 seconds nails thick brittle crumbly dystrophic and friable 1 through 5 bilateral. Hair growth absent nails brittle and crumbly tender on palpation and include shoe wear multiple keratoses noted pinch callus first IP joint left great toe also keratoses sub-15 right and sub-fifth met base right with a keratotic lesions are all debrided at this time. No open wounds no ulcers no secondary infections.      Assessment & Plan:  Assessment this time is arthropathy as well as history of onychomycosis painful mycotic nails debrided 1 through 5 bilateral also painful keratotic lesions pinch callus first left and sub-1 and 5 right are debrided return for future palliative nail care and skin care every 3 months as recommended  Harriet Masson DPM

## 2014-12-07 ENCOUNTER — Ambulatory Visit (INDEPENDENT_AMBULATORY_CARE_PROVIDER_SITE_OTHER): Payer: Commercial Managed Care - HMO | Admitting: Neurology

## 2014-12-07 DIAGNOSIS — E538 Deficiency of other specified B group vitamins: Secondary | ICD-10-CM

## 2014-12-07 MED ORDER — CYANOCOBALAMIN 1000 MCG/ML IJ SOLN
1000.0000 ug | Freq: Once | INTRAMUSCULAR | Status: AC
  Administered 2014-12-07: 1000 ug via INTRAMUSCULAR

## 2014-12-07 NOTE — Progress Notes (Signed)
B12 injection to right deltoid with no apparent complications.   

## 2014-12-10 ENCOUNTER — Other Ambulatory Visit: Payer: Self-pay | Admitting: Neurology

## 2014-12-10 ENCOUNTER — Other Ambulatory Visit: Payer: Self-pay

## 2014-12-10 MED ORDER — DONEPEZIL HCL 10 MG PO TABS: 10.0000 mg | ORAL_TABLET | Freq: Every day | ORAL | Status: AC

## 2014-12-10 MED ORDER — LEVOTHYROXINE SODIUM 100 MCG PO TABS: 100.0000 ug | ORAL_TABLET | Freq: Every day | ORAL | Status: DC

## 2014-12-10 MED ORDER — OLMESARTAN MEDOXOMIL 40 MG PO TABS: 40.0000 mg | ORAL_TABLET | Freq: Every day | ORAL | Status: DC

## 2014-12-10 MED ORDER — LISINOPRIL 5 MG PO TABS: 5.0000 mg | ORAL_TABLET | Freq: Every day | ORAL | Status: DC

## 2014-12-10 MED ORDER — PROPRANOLOL HCL ER 160 MG PO CP24: 160.0000 mg | ORAL_CAPSULE | Freq: Every day | ORAL | Status: DC

## 2014-12-10 MED ORDER — POTASSIUM CHLORIDE CRYS ER 10 MEQ PO TBCR: EXTENDED_RELEASE_TABLET | ORAL | Status: DC

## 2014-12-10 MED ORDER — SIMVASTATIN 20 MG PO TABS: 20.0000 mg | ORAL_TABLET | Freq: Every day | ORAL | Status: DC

## 2014-12-10 MED ORDER — AMLODIPINE BESYLATE 10 MG PO TABS: 10.0000 mg | ORAL_TABLET | Freq: Every day | ORAL | Status: DC

## 2014-12-10 NOTE — Telephone Encounter (Signed)
Aricept refill requested. Per last office note- patient to remain on medication. Refill approved and sent to patient's pharmacy.   

## 2014-12-13 ENCOUNTER — Other Ambulatory Visit: Payer: Self-pay

## 2014-12-13 MED ORDER — OLMESARTAN MEDOXOMIL 40 MG PO TABS: 40.0000 mg | ORAL_TABLET | Freq: Every day | ORAL | Status: DC

## 2014-12-13 MED ORDER — AMLODIPINE BESYLATE 10 MG PO TABS: 10.0000 mg | ORAL_TABLET | Freq: Every day | ORAL | Status: DC

## 2014-12-13 MED ORDER — LISINOPRIL 5 MG PO TABS: 5.0000 mg | ORAL_TABLET | Freq: Every day | ORAL | Status: DC

## 2014-12-13 MED ORDER — PROPRANOLOL HCL ER 160 MG PO CP24: 160.0000 mg | ORAL_CAPSULE | Freq: Every day | ORAL | Status: DC

## 2014-12-13 MED ORDER — POTASSIUM CHLORIDE CRYS ER 10 MEQ PO TBCR: EXTENDED_RELEASE_TABLET | ORAL | Status: DC

## 2014-12-13 MED ORDER — LEVOTHYROXINE SODIUM 100 MCG PO TABS: 100.0000 ug | ORAL_TABLET | Freq: Every day | ORAL | Status: DC

## 2014-12-13 MED ORDER — SIMVASTATIN 20 MG PO TABS: 20.0000 mg | ORAL_TABLET | Freq: Every day | ORAL | Status: DC

## 2014-12-24 ENCOUNTER — Ambulatory Visit (INDEPENDENT_AMBULATORY_CARE_PROVIDER_SITE_OTHER): Payer: Commercial Managed Care - HMO | Admitting: *Deleted

## 2014-12-24 DIAGNOSIS — I4729 Other ventricular tachycardia: Secondary | ICD-10-CM

## 2014-12-24 DIAGNOSIS — I472 Ventricular tachycardia: Secondary | ICD-10-CM | POA: Diagnosis not present

## 2014-12-24 LAB — MDC_IDC_ENUM_SESS_TYPE_REMOTE
Battery Remaining Percentage: 60 %
Brady Statistic RA Percent Paced: 96 %
HighPow Impedance: 57 Ohm
Lead Channel Sensing Intrinsic Amplitude: 1.2 mV
Lead Channel Sensing Intrinsic Amplitude: 11.8 mV
Lead Channel Setting Pacing Amplitude: 2 V
MDC IDC MSMT LEADCHNL RA IMPEDANCE VALUE: 330 Ohm
MDC IDC MSMT LEADCHNL RV IMPEDANCE VALUE: 440 Ohm
MDC IDC PG SERIAL: 7016049
MDC IDC SET LEADCHNL RV PACING AMPLITUDE: 2.5 V
MDC IDC SET LEADCHNL RV PACING PULSEWIDTH: 0.5 ms
MDC IDC SET LEADCHNL RV SENSING SENSITIVITY: 0.5 mV
MDC IDC SET ZONE DETECTION INTERVAL: 350 ms
MDC IDC STAT BRADY RV PERCENT PACED: 53 %
Zone Setting Detection Interval: 320 ms

## 2014-12-24 NOTE — Progress Notes (Signed)
Remote ICD transmission.   

## 2014-12-26 ENCOUNTER — Other Ambulatory Visit: Payer: Self-pay | Admitting: *Deleted

## 2014-12-26 MED ORDER — SIMVASTATIN 20 MG PO TABS: 20.0000 mg | ORAL_TABLET | Freq: Every day | ORAL | Status: AC

## 2014-12-26 MED ORDER — LISINOPRIL 5 MG PO TABS: 5.0000 mg | ORAL_TABLET | Freq: Every day | ORAL | Status: DC

## 2014-12-26 MED ORDER — AMLODIPINE BESYLATE 10 MG PO TABS: 10.0000 mg | ORAL_TABLET | Freq: Every day | ORAL | Status: DC

## 2014-12-26 MED ORDER — OLMESARTAN MEDOXOMIL 40 MG PO TABS: 40.0000 mg | ORAL_TABLET | Freq: Every day | ORAL | Status: DC

## 2014-12-26 MED ORDER — POTASSIUM CHLORIDE CRYS ER 10 MEQ PO TBCR: EXTENDED_RELEASE_TABLET | ORAL | Status: DC

## 2014-12-26 MED ORDER — PROPRANOLOL HCL ER 160 MG PO CP24: 160.0000 mg | ORAL_CAPSULE | Freq: Every day | ORAL | Status: DC

## 2014-12-26 MED ORDER — LEVOTHYROXINE SODIUM 100 MCG PO TABS: 100.0000 ug | ORAL_TABLET | Freq: Every day | ORAL | Status: AC

## 2014-12-26 NOTE — Telephone Encounter (Signed)
Wrong encounter pt has duplicate charts...Erika Rivera

## 2015-01-04 ENCOUNTER — Ambulatory Visit (INDEPENDENT_AMBULATORY_CARE_PROVIDER_SITE_OTHER): Payer: Commercial Managed Care - HMO | Admitting: Neurology

## 2015-01-04 DIAGNOSIS — E538 Deficiency of other specified B group vitamins: Secondary | ICD-10-CM

## 2015-01-04 MED ORDER — CYANOCOBALAMIN 1000 MCG/ML IJ SOLN
1000.0000 ug | Freq: Once | INTRAMUSCULAR | Status: AC
  Administered 2015-01-04: 1000 ug via INTRAMUSCULAR

## 2015-01-04 NOTE — Progress Notes (Signed)
B12 injection to left deltoid with no apparent complications.   

## 2015-01-10 ENCOUNTER — Encounter: Payer: Self-pay | Admitting: Cardiology

## 2015-01-14 ENCOUNTER — Encounter: Payer: Self-pay | Admitting: Internal Medicine

## 2015-01-15 ENCOUNTER — Telehealth: Payer: Self-pay | Admitting: *Deleted

## 2015-01-15 NOTE — Telephone Encounter (Signed)
Received fax stating pt has rx for Benicar 40 mg, and also active rx for lisinopril 5mg . Pls clarify if the current therapy is benicar 40 mg or lisinopril 5mg ...MD out of office pls advise...[name]

## 2015-01-15 NOTE — Telephone Encounter (Signed)
Erika Rivera  return call back gave her [name] response. She stated she is not taking the lisinopril her kidney md took her off. She had contacted [employer] to let them know not to fill anymore...[name]

## 2015-01-15 NOTE — Telephone Encounter (Signed)
Faxed [employer] back with greg response. Called pt/daughter [name]) no answer LMOM RTC....Erika Rivera

## 2015-01-15 NOTE — Telephone Encounter (Signed)
Continue Benicar. Discontinue lisinopril. Have the patient follow up for blood pressure check in a couple of weeks or sooner if needed.

## 2015-02-08 ENCOUNTER — Ambulatory Visit (INDEPENDENT_AMBULATORY_CARE_PROVIDER_SITE_OTHER): Payer: Commercial Managed Care - HMO | Admitting: Neurology

## 2015-02-08 DIAGNOSIS — E538 Deficiency of other specified B group vitamins: Secondary | ICD-10-CM

## 2015-02-08 MED ORDER — CYANOCOBALAMIN 1000 MCG/ML IJ SOLN
1000.0000 ug | Freq: Once | INTRAMUSCULAR | Status: AC
  Administered 2015-02-08: 1000 ug via INTRAMUSCULAR

## 2015-02-08 NOTE — Progress Notes (Signed)
B12 injection to right deltoid with no apparent complications.   

## 2015-03-01 ENCOUNTER — Telehealth: Payer: Self-pay

## 2015-03-01 MED ORDER — TRIAMCINOLONE ACETONIDE 0.1 % EX CREA: 1.0000 "application " | TOPICAL_CREAM | Freq: Every day | CUTANEOUS | Status: AC

## 2015-03-01 NOTE — Telephone Encounter (Signed)
dtr wanted to know about rx sent in. Stated that the rx for lisinopril was d/c by kidney doctor. Requested rx for kenalong. erx done to [facility]. [employer] contacted and d/c of lisinopril to prevent any additional fills of this rx.

## 2015-03-07 NOTE — Discharge Instructions (Signed)

## 2015-03-08 ENCOUNTER — Inpatient Hospital Stay (HOSPITAL_COMMUNITY)
Admission: RE | Admit: 2015-03-08 | Discharge: 2015-03-08 | Disposition: A | Discharge status: Home / Self Care | Payer: Commercial Managed Care - HMO | Source: Ambulatory Visit | Attending: Nephrology | Admitting: Nephrology

## 2015-03-08 ENCOUNTER — Ambulatory Visit: Payer: Commercial Managed Care - HMO

## 2015-03-11 ENCOUNTER — Ambulatory Visit (INDEPENDENT_AMBULATORY_CARE_PROVIDER_SITE_OTHER): Payer: Commercial Managed Care - HMO | Admitting: Neurology

## 2015-03-11 DIAGNOSIS — E538 Deficiency of other specified B group vitamins: Secondary | ICD-10-CM | POA: Diagnosis not present

## 2015-03-11 MED ORDER — CYANOCOBALAMIN 1000 MCG/ML IJ SOLN
1000.0000 ug | Freq: Once | INTRAMUSCULAR | Status: AC
  Administered 2015-03-11: 1000 ug via INTRAMUSCULAR

## 2015-03-11 NOTE — Progress Notes (Signed)
B12 injection to left deltoid with no apparent complications.   

## 2015-03-12 ENCOUNTER — Ambulatory Visit: Payer: Commercial Managed Care - HMO

## 2015-03-22 ENCOUNTER — Encounter (HOSPITAL_COMMUNITY)
Admission: RE | Admit: 2015-03-22 | Discharge: 2015-03-22 | Disposition: A | Discharge status: Home / Self Care | Payer: Commercial Managed Care - HMO | Source: Ambulatory Visit | Attending: Nephrology | Admitting: Nephrology

## 2015-03-22 ENCOUNTER — Inpatient Hospital Stay (HOSPITAL_COMMUNITY)
Admission: EM | Admit: 2015-03-22 | Discharge: 2015-03-23 | DRG: 683 | Disposition: A | Discharge status: Home Health | Payer: Commercial Managed Care - HMO | Attending: Internal Medicine | Admitting: Internal Medicine

## 2015-03-22 ENCOUNTER — Encounter (HOSPITAL_COMMUNITY): Payer: Self-pay

## 2015-03-22 DIAGNOSIS — I129 Hypertensive chronic kidney disease with stage 1 through stage 4 chronic kidney disease, or unspecified chronic kidney disease: Principal | ICD-10-CM | POA: Diagnosis present

## 2015-03-22 DIAGNOSIS — D649 Anemia, unspecified: Secondary | ICD-10-CM | POA: Diagnosis present

## 2015-03-22 DIAGNOSIS — Z9861 Coronary angioplasty status: Secondary | ICD-10-CM | POA: Diagnosis not present

## 2015-03-22 DIAGNOSIS — E785 Hyperlipidemia, unspecified: Secondary | ICD-10-CM | POA: Diagnosis present

## 2015-03-22 DIAGNOSIS — N183 Chronic kidney disease, stage 3 unspecified: Secondary | ICD-10-CM

## 2015-03-22 DIAGNOSIS — Z95810 Presence of automatic (implantable) cardiac defibrillator: Secondary | ICD-10-CM | POA: Diagnosis not present

## 2015-03-22 DIAGNOSIS — E039 Hypothyroidism, unspecified: Secondary | ICD-10-CM | POA: Diagnosis present

## 2015-03-22 DIAGNOSIS — N179 Acute kidney failure, unspecified: Secondary | ICD-10-CM

## 2015-03-22 DIAGNOSIS — Z79899 Other long term (current) drug therapy: Secondary | ICD-10-CM | POA: Insufficient documentation

## 2015-03-22 DIAGNOSIS — D631 Anemia in chronic kidney disease: Secondary | ICD-10-CM | POA: Diagnosis present

## 2015-03-22 DIAGNOSIS — Z5181 Encounter for therapeutic drug level monitoring: Secondary | ICD-10-CM | POA: Insufficient documentation

## 2015-03-22 DIAGNOSIS — Z8673 Personal history of transient ischemic attack (TIA), and cerebral infarction without residual deficits: Secondary | ICD-10-CM | POA: Diagnosis not present

## 2015-03-22 DIAGNOSIS — D72819 Decreased white blood cell count, unspecified: Secondary | ICD-10-CM | POA: Diagnosis present

## 2015-03-22 DIAGNOSIS — Z7982 Long term (current) use of aspirin: Secondary | ICD-10-CM

## 2015-03-22 DIAGNOSIS — H9190 Unspecified hearing loss, unspecified ear: Secondary | ICD-10-CM | POA: Diagnosis present

## 2015-03-22 DIAGNOSIS — M1990 Unspecified osteoarthritis, unspecified site: Secondary | ICD-10-CM | POA: Diagnosis present

## 2015-03-22 DIAGNOSIS — R531 Weakness: Secondary | ICD-10-CM | POA: Diagnosis present

## 2015-03-22 DIAGNOSIS — Z87891 Personal history of nicotine dependence: Secondary | ICD-10-CM | POA: Diagnosis not present

## 2015-03-22 DIAGNOSIS — E8770 Fluid overload, unspecified: Secondary | ICD-10-CM | POA: Diagnosis present

## 2015-03-22 DIAGNOSIS — M3214 Glomerular disease in systemic lupus erythematosus: Secondary | ICD-10-CM | POA: Diagnosis present

## 2015-03-22 DIAGNOSIS — N022 Recurrent and persistent hematuria with diffuse membranous glomerulonephritis: Secondary | ICD-10-CM | POA: Diagnosis present

## 2015-03-22 DIAGNOSIS — N184 Chronic kidney disease, stage 4 (severe): Secondary | ICD-10-CM | POA: Diagnosis present

## 2015-03-22 DIAGNOSIS — D6481 Anemia due to antineoplastic chemotherapy: Secondary | ICD-10-CM | POA: Diagnosis not present

## 2015-03-22 DIAGNOSIS — G250 Essential tremor: Secondary | ICD-10-CM | POA: Diagnosis present

## 2015-03-22 DIAGNOSIS — I2510 Atherosclerotic heart disease of native coronary artery without angina pectoris: Secondary | ICD-10-CM | POA: Diagnosis present

## 2015-03-22 DIAGNOSIS — F0390 Unspecified dementia without behavioral disturbance: Secondary | ICD-10-CM | POA: Diagnosis present

## 2015-03-22 LAB — CBC
HEMATOCRIT: 20.6 % — AB (ref 36.0–46.0)
Hemoglobin: 6.8 g/dL — CL (ref 12.0–15.0)
MCH: 28 pg (ref 26.0–34.0)
MCHC: 33 g/dL (ref 30.0–36.0)
MCV: 84.8 fL (ref 78.0–100.0)
Platelets: 231 10*3/uL (ref 150–400)
RBC: 2.43 MIL/uL — AB (ref 3.87–5.11)
RDW: 16.9 % — ABNORMAL HIGH (ref 11.5–15.5)
WBC: 1.7 10*3/uL — ABNORMAL LOW (ref 4.0–10.5)

## 2015-03-22 LAB — COMPREHENSIVE METABOLIC PANEL
ALT: 7 U/L — ABNORMAL LOW (ref 14–54)
ANION GAP: 5 (ref 5–15)
AST: 15 U/L (ref 15–41)
Albumin: 1.9 g/dL — ABNORMAL LOW (ref 3.5–5.0)
Alkaline Phosphatase: 57 U/L (ref 38–126)
BILIRUBIN TOTAL: 0.7 mg/dL (ref 0.3–1.2)
BUN: 20 mg/dL (ref 6–20)
CHLORIDE: 117 mmol/L — AB (ref 101–111)
CO2: 18 mmol/L — AB (ref 22–32)
Calcium: 8.5 mg/dL — ABNORMAL LOW (ref 8.9–10.3)
Creatinine, Ser: 2.92 mg/dL — ABNORMAL HIGH (ref 0.44–1.00)
GFR calc Af Amer: 17 mL/min — ABNORMAL LOW (ref >60)
GFR calc non Af Amer: 15 mL/min — ABNORMAL LOW (ref >60)
Glucose, Bld: 90 mg/dL (ref 65–99)
POTASSIUM: 3.4 mmol/L — AB (ref 3.5–5.1)
SODIUM: 140 mmol/L (ref 135–145)
Total Protein: 5.7 g/dL — ABNORMAL LOW (ref 6.5–8.1)

## 2015-03-22 LAB — POC OCCULT BLOOD, ED: Fecal Occult Bld: NEGATIVE

## 2015-03-22 LAB — ABO/RH: ABO/RH(D): O POS

## 2015-03-22 LAB — PREPARE RBC (CROSSMATCH)

## 2015-03-22 LAB — POCT HEMOGLOBIN-HEMACUE: HEMOGLOBIN: 6.3 g/dL — AB (ref 12.0–15.0)

## 2015-03-22 MED ORDER — ONDANSETRON HCL 4 MG PO TABS: 4.0000 mg | ORAL_TABLET | Freq: Four times a day (QID) | ORAL | Status: DC | PRN

## 2015-03-22 MED ORDER — SODIUM CHLORIDE 0.9 % IV SOLN: INTRAVENOUS | Status: DC

## 2015-03-22 MED ORDER — LEVOTHYROXINE SODIUM 100 MCG PO TABS
100.0000 ug | ORAL_TABLET | Freq: Every day | ORAL | Status: DC
  Administered 2015-03-22 – 2015-03-23 (×2): 100 ug via ORAL
  Filled 2015-03-22 (×2): qty 1

## 2015-03-22 MED ORDER — FUROSEMIDE 10 MG/ML IJ SOLN
20.0000 mg | Freq: Once | INTRAMUSCULAR | Status: AC
  Administered 2015-03-22: 20 mg via INTRAVENOUS
  Filled 2015-03-22: qty 2

## 2015-03-22 MED ORDER — ACETAMINOPHEN 650 MG RE SUPP: 650.0000 mg | Freq: Four times a day (QID) | RECTAL | Status: DC | PRN

## 2015-03-22 MED ORDER — AMLODIPINE BESYLATE 10 MG PO TABS
10.0000 mg | ORAL_TABLET | Freq: Every day | ORAL | Status: DC
  Administered 2015-03-22 – 2015-03-23 (×2): 10 mg via ORAL
  Filled 2015-03-22 (×2): qty 1

## 2015-03-22 MED ORDER — DONEPEZIL HCL 10 MG PO TABS
10.0000 mg | ORAL_TABLET | Freq: Every day | ORAL | Status: DC
  Administered 2015-03-22: 10 mg via ORAL
  Filled 2015-03-22: qty 1

## 2015-03-22 MED ORDER — SODIUM CHLORIDE 0.9 % IV SOLN: 10.0000 mL/h | Freq: Once | INTRAVENOUS | Status: DC

## 2015-03-22 MED ORDER — ASPIRIN 81 MG PO CHEW
81.0000 mg | CHEWABLE_TABLET | Freq: Every day | ORAL | Status: DC
  Administered 2015-03-22 – 2015-03-23 (×2): 81 mg via ORAL
  Filled 2015-03-22 (×2): qty 1

## 2015-03-22 MED ORDER — EPOETIN ALFA 10000 UNIT/ML IJ SOLN
INTRAMUSCULAR | Status: AC
Start: 2015-03-22 — End: 2015-03-22
  Filled 2015-03-22: qty 1

## 2015-03-22 MED ORDER — ACETAMINOPHEN 325 MG PO TABS: 650.0000 mg | ORAL_TABLET | Freq: Four times a day (QID) | ORAL | Status: DC | PRN

## 2015-03-22 MED ORDER — SIMVASTATIN 20 MG PO TABS
20.0000 mg | ORAL_TABLET | Freq: Every day | ORAL | Status: DC
  Administered 2015-03-22: 20 mg via ORAL
  Filled 2015-03-22: qty 1

## 2015-03-22 MED ORDER — ONDANSETRON HCL 4 MG/2ML IJ SOLN
4.0000 mg | Freq: Four times a day (QID) | INTRAMUSCULAR | Status: DC | PRN
Start: 2015-03-22 — End: 2015-03-23

## 2015-03-22 MED ORDER — EPOETIN ALFA 10000 UNIT/ML IJ SOLN
10000.0000 [IU] | INTRAMUSCULAR | Status: DC
Start: 2015-03-22 — End: 2015-03-23
  Administered 2015-03-22: 10000 [IU] via SUBCUTANEOUS

## 2015-03-22 MED ORDER — PROPRANOLOL HCL ER 160 MG PO CP24
160.0000 mg | ORAL_CAPSULE | Freq: Every day | ORAL | Status: DC
  Administered 2015-03-22 – 2015-03-23 (×2): 160 mg via ORAL
  Filled 2015-03-22 (×2): qty 1

## 2015-03-22 MED ORDER — ENOXAPARIN SODIUM 40 MG/0.4ML ~~LOC~~ SOLN
40.0000 mg | SUBCUTANEOUS | Status: DC
  Administered 2015-03-22: 40 mg via SUBCUTANEOUS
  Filled 2015-03-22: qty 0.4

## 2015-03-22 NOTE — ED Notes (Addendum)
2 RN attempt IV, unsuccessful. Consult IV team.

## 2015-03-22 NOTE — Progress Notes (Signed)
Pt came in today for scheduled Procrit shot.  Pt has originally scheduled for 6/3 but cancelled apt and changed to today.  Pt's HemoCue was 6.3  Pt is very lethargic and sired.  Pt's daughter states that her mother has been very tired and has not wanted to get out of the bed.  [facility] was called I talked to crystal and orders from Dr Moshe Cipro were given to give pt her scheduled procrit and take pt to the ED.  Janett Billow Agricultural consultant in the ED was called and we were instructed to take pt to triage.

## 2015-03-22 NOTE — ED Provider Notes (Signed)
CSN: [id number]     Arrival date & time 03/22/15  1135 History   First MD Initiated Contact with Patient 03/22/15 1310     Chief Complaint  Patient presents with  . Abnormal Lab     (Consider location/radiation/quality/duration/timing/severity/associated sxs/prior Treatment) The history is provided by the patient and medical records.    This is a 79 y.o. F with hx of HTN, HLP, CAD, CKD stage III, presenting to the ED for low hemoglobin.  Patient was recently started on cyclophosphamide by her nephrologist, Dr. Posey Pronto.  States she went to the short stay center today to get her Epogen shot and was told that her hemoglobin was 6.3, therefore sent to the ED for further management.  Daughter does admit that patient has been weak for the past month, but significant worsened 2 days ago.  Patient does admit to fatigue and decreased energy.  She denies any bloody stools.  No hx of GI bleeding in the past.  Patient is on daily ASA, no other anti-coagulation. Patient's colonoscopy is UTD, had polyps removed once before.  Denies pain currently.  VSS.  Past Medical History  Diagnosis Date  . ARTHRITIS, HAND   . ARTHRITIS, LEFT SHOULDER   . HEARING LOSS   . SMOKER   . CEREBROVASCULAR ACCIDENT, HX OF   . HYPERTENSION   . HYPERLIPIDEMIA   . HYPOTHYROIDISM   . OVERACTIVE BLADDER   . Cerebral aneurysm     s/p clipping  . Polymorphic ventricular tachycardia     s/p St Jude AICD (dual chamber) 12/2011;  Echo: mild LVH, EF 50-555, grade 1 diast dysfxn, trivial AI.   Marland Kitchen Torsade de pointes     PVC dependent  . Syncope     2/2 VTach  . Bradycardia     A paced by AICD  . Essential tremor   . Osteopenia 11/18/2012    10/14/12 DEXA @ [facility]: -2.2  . Allergy   . Arthritis   . CAD (coronary artery disease)     LHC 12/2011: RCA 75% (neg FFR), EF 55-65% - med Tx  . Dementia   . Vitamin B 12 deficiency   . Hypothyroidism   . Chronic kidney disease, stage III (moderate)   . High blood pressure with chronic  kidney disease   . Stroke     With rupture of intra-cranial aneurysm  . Polymorphic ventricular tachycardia     s/p ICD placement  . Proteinuria   . Lupus     Pt reports "lupus of skin"   Past Surgical History  Procedure Laterality Date  . Cardiac defibrillator placement  12/07/11    SJM ICD implanted for recurrent Torsades with syncope  . Aneursym clipping      cerebral  . Left heart catheterization with coronary angiogram N/A 12/07/2011    Procedure: LEFT HEART CATHETERIZATION WITH CORONARY ANGIOGRAM;  Surgeon: Sherren Mocha, MD;  Location: [facility] CATH LAB;  Service: Cardiovascular;  Laterality: N/A;  . Implantable cardioverter defibrillator implant N/A 12/07/2011    Procedure: IMPLANTABLE CARDIOVERTER DEFIBRILLATOR IMPLANT;  Surgeon: Thompson Grayer, MD;  Location: [facility] CATH LAB;  Service: Cardiovascular;  Laterality: N/A;   Family History  Problem Relation Age of Onset  . Gout Father   . Kidney disease Father     kidney failure  . Arthritis Other   . Hypertension Other   . Kidney disease Other   . Heart disease Other   . Thyroid disease Other   . Colon polyps Neg Hx   .  Esophageal cancer Neg Hx   . Rectal cancer Neg Hx   . Stomach cancer Neg Hx    History  Substance Use Topics  . Smoking status: Former Smoker -- 0.60 packs/day    Quit date: 11/28/2011  . 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 Alcohol Use: No   OB History    No data available     Review of Systems  Constitutional: Positive for fatigue.       Abnormal labs  Neurological: Positive for weakness.  All other systems reviewed and are negative.     Allergies  Diphenhydramine hcl and Penicillins  Home Medications   Prior to Admission medications   Medication Sig Start Date End Date Taking? Authorizing Provider  amLODipine (NORVASC) 10 MG tablet Take 1 tablet (10 mg total) by mouth daily. 12/26/14   Rowe Clack, MD  Calcium  Carbonate-Vitamin D (CALCIUM + D PO) Take 1 capsule by mouth daily.  12/12/12   Historical Provider, MD  Cholecalciferol (VITAMIN D3) 5000 UNITS TABS Take 5,000 Units by mouth daily. 09/22/12   Rowe Clack, MD  donepezil (ARICEPT) 10 MG tablet Take 1 tablet (10 mg total) by mouth at bedtime. 12/10/14   Eustace Quail Tat, DO  levothyroxine (SYNTHROID, LEVOTHROID) 100 MCG tablet Take 1 tablet (100 mcg total) by mouth daily. 12/26/14   Rowe Clack, MD  olmesartan (BENICAR) 40 MG tablet Take 1 tablet (40 mg total) by mouth daily. 12/26/14   Rowe Clack, MD  potassium chloride (KLOR-CON M10) 10 MEQ tablet Take 1 tablet by mouth on Mon, Wed and Friday. 12/26/14   Rowe Clack, MD  propranolol ER (INDERAL LA) 160 MG SR capsule Take 1 capsule (160 mg total) by mouth daily. 12/26/14   Rowe Clack, MD  simvastatin (ZOCOR) 20 MG tablet Take 1 tablet (20 mg total) by mouth daily at 6 PM. 12/26/14   Rowe Clack, MD  triamcinolone cream (KENALOG) 0.1 % Apply 1 application topically daily. 03/01/15   Rowe Clack, MD   BP 129/52 mmHg  Pulse 73  Temp(Src) 97 F (36.1 C) (Oral)  Resp 18  SpO2 100%   Physical Exam  Constitutional: She is oriented to person, place, and time. She appears well-developed and well-nourished.  elderly  HENT:  Head: Normocephalic and atraumatic.  Mouth/Throat: Oropharynx is clear and moist.  Eyes: Conjunctivae and EOM are normal. Pupils are equal, round, and reactive to light.  Neck: Normal range of motion.  Cardiovascular: Normal rate, regular rhythm and normal heart sounds.   Pulmonary/Chest: Effort normal and breath sounds normal.  Abdominal: Soft. Bowel sounds are normal.  Genitourinary: Rectal exam shows external hemorrhoid. Rectal exam shows no internal hemorrhoid, no mass, no tenderness and anal tone normal.  External hemorrhoids noted, light brown stool on glove, no gross blood  Musculoskeletal: Normal range of motion.  Neurological:  She is alert and oriented to person, place, and time.  Skin: Skin is warm and dry.  Psychiatric: She has a normal mood and affect.  Nursing note and vitals reviewed.   ED Course  Procedures (including critical care time)  CRITICAL CARE Performed by: Larene Pickett   Total critical care time: 40  Critical care time was exclusive of separately billable procedures and treating other patients.  Critical care was necessary to treat or prevent imminent or life-threatening deterioration.  Critical care was time  spent personally by me on the following activities: development of treatment plan with patient and/or surrogate as well as nursing, discussions with consultants, evaluation of patient's response to treatment, examination of patient, obtaining history from patient or surrogate, ordering and performing treatments and interventions, ordering and review of laboratory studies, ordering and review of radiographic studies, pulse oximetry and re-evaluation of patient's condition.  Medications  0.9 %  sodium chloride infusion (not administered)    Labs Review Labs Reviewed  CBC - Abnormal; Notable for the following:    WBC 1.7 (*)    RBC 2.43 (*)    Hemoglobin 6.8 (*)    HCT 20.6 (*)    RDW 16.9 (*)    All other components within normal limits  COMPREHENSIVE METABOLIC PANEL - Abnormal; Notable for the following:    Potassium 3.4 (*)    Chloride 117 (*)    CO2 18 (*)    Creatinine, Ser 2.92 (*)    Calcium 8.5 (*)    Total Protein 5.7 (*)    Albumin 1.9 (*)    ALT 7 (*)    GFR calc non Af Amer 15 (*)    GFR calc Af Amer 17 (*)    All other components within normal limits  POC OCCULT BLOOD, ED  TYPE AND SCREEN  ABO/RH  PREPARE RBC (CROSSMATCH)    Imaging Review No results found.   EKG Interpretation None      MDM   Final diagnoses:  Symptomatic anemia  AKI (acute kidney injury)  CKD (chronic kidney disease) stage 3, GFR 30-59 ml/min   79 year old female here  from short stay with low hemoglobin.  She denies acute blood loss in stools or emesis.  No abdominal pain, chest pain, SOB.  Patient is on cyclophosphamide prescribed by her nephrologist.  Patient afebrile, non-toxic.  Exam benign.  Guaiac negative.  Suspect her cyclophosphamide is causing the anemia-- hgb here 6.8.  Patient also has apparent AKI-- baseline SrCr around 1.3- 1.5 and is up to 2.92 today.  Will transfuse 1 unit and start IVF.  Patient will be admitted to medicine service for further management.   Larene Pickett, PA-C 03/22/15 [place], MD 03/22/15 1515

## 2015-03-22 NOTE — Consult Note (Addendum)
Erika Rivera Admit Date: 03/22/2015 03/22/2015 Rexene Agent Requesting Physician:  Fanny Bien MD  Reason for Consult:  Anemia, AoCKD, Proteinuria, Membranous Nephropathy HPI:  79 year old female seen at the request of Dr. Broadus John for the above mentioned issues. Patient's pertinent past medical history includes dementia, history of torsades status post AICD, history of cerebral aneurysm, CAD with history of PCI. Over the past 12 months the patient developed progressive nephrotic proteinuria with a UP/C as high as 10.  She underwent renal biopsy on 06/21/14 with findings of membranous nephropathy.  Maximal therapy with inhibition of renin and angiotensin system was pursued though her proteinuria persisted. She developed some worsening in renal failure on dual ACE inhibitor and ARB. Serological studies preceding her biopsy demonstrated a positive ANA by immunofluorescence without titer, negative double-stranded DNA, normal complement, absence of monoclonal process, extractable nuclear antigens negative with the exception of mildly elevated RNP of unclear clinical significance. Given her progressive GFR decline and sustained proteinuria she started on cyclophosphamide therapy, 150 mg daily, on or around 02/15/15.   On 01/11/15 her creatinine was 2.97 and her hemoglobin was 9.  On 03/05/15 her creatinine was 2.73 and hemoglobin was 7.7; erythropoietin therapy was started.  At that time UPC was 9.  Her daughter brought her to the hospital today because of concerns of weakness and fatigue. She been more somnolent than usual. She had a hemoglobin of 6.3 when seen at the short stay unit was brought here. White blood cell count was 1.7 and on 5/31 it was 2.7. Platelets have been normal. Guaiac test for stool was negative in the emergency room. She and her daughter deny any overt loss of blood such as hematochezia, melanoma, epistaxis, hemoptysis, hematuria.  Pertinent renal medicines, in addition to the  cyclophosphamide, include Benicar 20 mg daily and amlodipine.   CREATININE, SER (mg/dL)  Date Value  03/22/2015 2.92*  09/27/2014 2.3*  01/09/2014 1.5*  09/22/2013 1.6*  03/31/2013 1.3*  09/22/2012 1.6*  06/30/2012 1.7*  03/28/2012 1.4*  02/15/2012 1.4*  01/25/2012 1.4*  ] ROS  Balance of 12 systems is negative w/ exceptions as above  PMH  Past Medical History  Diagnosis Date  . ARTHRITIS, HAND   . ARTHRITIS, LEFT SHOULDER   . HEARING LOSS   . SMOKER   . CEREBROVASCULAR ACCIDENT, HX OF   . HYPERTENSION   . HYPERLIPIDEMIA   . HYPOTHYROIDISM   . OVERACTIVE BLADDER   . Cerebral aneurysm     s/p clipping  . Polymorphic ventricular tachycardia     s/p St Jude AICD (dual chamber) 12/2011;  Echo: mild LVH, EF 50-555, grade 1 diast dysfxn, trivial AI.   Marland Kitchen Torsade de pointes     PVC dependent  . Syncope     2/2 VTach  . Bradycardia     A paced by AICD  . Essential tremor   . Osteopenia 11/18/2012    10/14/12 DEXA @ [facility]: -2.2  . Allergy   . Arthritis   . CAD (coronary artery disease)     LHC 12/2011: RCA 75% (neg FFR), EF 55-65% - med Tx  . Dementia   . Vitamin B 12 deficiency   . Hypothyroidism   . Chronic kidney disease, stage III (moderate)   . High blood pressure with chronic kidney disease   . Stroke     With rupture of intra-cranial aneurysm  . Polymorphic ventricular tachycardia     s/p ICD placement  . Proteinuria   . Lupus  Pt reports "lupus of skin"   PSH  Past Surgical History  Procedure Laterality Date  . Cardiac defibrillator placement  12/07/11    SJM ICD implanted for recurrent Torsades with syncope  . Aneursym clipping      cerebral  . Left heart catheterization with coronary angiogram N/A 12/07/2011    Procedure: LEFT HEART CATHETERIZATION WITH CORONARY ANGIOGRAM;  Surgeon: Sherren Mocha, MD;  Location: [facility] CATH LAB;  Service: Cardiovascular;  Laterality: N/A;  . Implantable cardioverter defibrillator implant N/A 12/07/2011    Procedure:  IMPLANTABLE CARDIOVERTER DEFIBRILLATOR IMPLANT;  Surgeon: Thompson Grayer, MD;  Location: [facility] CATH LAB;  Service: Cardiovascular;  Laterality: N/A;   FH  Family History  Problem Relation Age of Onset  . Gout Father   . Kidney disease Father     kidney failure  . Arthritis Other   . Hypertension Other   . Kidney disease Other   . Heart disease Other   . Thyroid disease Other   . Colon polyps Neg Hx   . Esophageal cancer Neg Hx   . Rectal cancer Neg Hx   . Stomach cancer Neg Hx    SH  reports that she quit smoking about 3 years ago. She has never used smokeless tobacco. She reports that she does not drink alcohol or use illicit drugs. Allergies  Allergies  Allergen Reactions  . Diphenhydramine Hcl Anaphylaxis    Tongue swelling  . Penicillins Rash   Home medications Prior to Admission medications   Medication Sig Start Date End Date Taking? Authorizing Provider  amLODipine (NORVASC) 10 MG tablet Take 1 tablet (10 mg total) by mouth daily. 12/26/14  Yes Rowe Clack, MD  aspirin 81 MG chewable tablet Chew 81 mg by mouth daily.   Yes Historical Provider, MD  Calcium Carbonate-Vitamin D (CALCIUM + D PO) Take 1 capsule by mouth daily.  12/12/12  Yes Historical Provider, MD  Cholecalciferol (VITAMIN D3) 5000 UNITS TABS Take 5,000 Units by mouth daily. 09/22/12  Yes Rowe Clack, MD  cyclophosphamide (CYTOXAN) 50 MG tablet Take 50 mg by mouth 3 (three) times daily. Give on an empty stomach 1 hour before or 2 hours after meals.   Yes Historical Provider, MD  donepezil (ARICEPT) 10 MG tablet Take 1 tablet (10 mg total) by mouth at bedtime. 12/10/14  Yes Rebecca S Tat, DO  levothyroxine (SYNTHROID, LEVOTHROID) 100 MCG tablet Take 1 tablet (100 mcg total) by mouth daily. 12/26/14  Yes Rowe Clack, MD  olmesartan (BENICAR) 40 MG tablet Take 1 tablet (40 mg total) by mouth daily. 12/26/14  Yes Rowe Clack, MD  potassium chloride (KLOR-CON M10) 10 MEQ tablet Take 1 tablet by  mouth on Mon, Wed and Friday. 12/26/14  Yes Rowe Clack, MD  propranolol ER (INDERAL LA) 160 MG SR capsule Take 1 capsule (160 mg total) by mouth daily. 12/26/14  Yes Rowe Clack, MD  simvastatin (ZOCOR) 20 MG tablet Take 1 tablet (20 mg total) by mouth daily at 6 PM. 12/26/14  Yes Rowe Clack, MD  triamcinolone cream (KENALOG) 0.1 % Apply 1 application topically daily. 03/01/15  Yes Rowe Clack, MD    Current Medications Scheduled Meds: . sodium chloride  10 mL/hr Intravenous Once  . amLODipine  10 mg Oral Daily  . aspirin  81 mg Oral Daily  . donepezil  10 mg Oral QHS  . enoxaparin (LOVENOX) injection  40 mg Subcutaneous Q24H  . furosemide  20 mg Intravenous  Once  . levothyroxine  100 mcg Oral QAC breakfast  . propranolol ER  160 mg Oral Daily  . simvastatin  20 mg Oral q1800   Continuous Infusions:  PRN Meds:.acetaminophen **OR** acetaminophen, ondansetron **OR** ondansetron (ZOFRAN) IV  CBC  Recent Labs Lab 03/22/15 1059 03/22/15 1215  WBC  --  1.7*  HGB 6.3* 6.8*  HCT  --  20.6*  MCV  --  84.8  PLT  --  852   Basic Metabolic Panel  Recent Labs Lab 03/22/15 1215  NA 140  K 3.4*  CL 117*  CO2 18*  GLUCOSE 90  BUN 20  CREATININE 2.92*  CALCIUM 8.5*    Physical Exam  Blood pressure 115/58, pulse 91, temperature 97 F (36.1 C), temperature source Oral, resp. rate 16, height 5\' 5"  (1.651 m), weight 75 kg (165 lb 5.5 oz), SpO2 97 %. GEN: NAD ENT: EOMI, EYES: EOMI CV: RRR, nl s1s2 no rub PULM: CTAB ABD: s/nd/d SKIN: no rashes/lesions EXT: 3+ LEE   Assessment/Plan 7F with membranous nephropathy on cyclophosphamide presenting with symptomatic anemia.  I believe there is inconclusive evidence that this is driven by lupus which would be unusual in a woman of this age. Though she had a mildly positive ANA by immunofluorescent she had normal complements and no other evidence of autoantibodies. This could be idiopathic membranous  nephropathy, of note no mention was made on her biopsy reports regarding phospholipase A2 receptor antibody.  She has no overt losses for blood. She had anemia prior to initiation of cyclophosphamide which has subsequent worsened. She has started on erythropoietin recently. I believe the most likely cause of her presenting anemia would be myelosuppression with leukopenia and anemia secondary to cyclophosphamide and erythropoietin deficiency from her CKD.  Importantly, when reviewing outpatient creatinine values, today's value is consistent with previous. It does not appear that she has any acute kidney disease at the current time.  1. CKD4 + Nephrotic Syndrome 2/2 Membranous Nephropathy, favor iMN 1. No evidence of AKI currently, cont ARB 2. Agree with holding cyclophosphamide 3. Other possible medications to consider include cortical steroidal and calcineurin inhibitors 4. Has f/u early July with Posey Pronto, defer this decision to him at that time 2. Symptomatic Anemia, normocytic 1. Etiology 2/2 CKD + Cyclophosphamide 2. Agree with 2u PRBC transfusion 3. Rec her ESA this AM 4. Monitor volume status 3. Hypervolemia 1. Not on diuretics at home 2. Stable currently, eval in AM, might need to add diuretic 3. Would probably choose torsemide 20mg  qAM 4. Leukopenia: as per #2 and 2/2 cyclophosphamide 5. Immunosuppression 6. Dementia 7. HTN    Pearson Grippe MD [phone] pgr 03/22/2015, 3:53 PM

## 2015-03-22 NOTE — H&P (Signed)
[facility] History and Physical  Erika Rivera JME:[id number] DOB: [birth year] DOA: 03/22/2015  Referring physician: EDP PCP: Erika Grant, MD   Chief Complaint: weakness  HPI: Erika Rivera is a 79 y.o. female with past history of Dementia, tremors, lupus nephritis, CKD3, history of AICD for torsades, she is followed by Dr. Elmarie Rivera and was started on cyclophosphamide a month ago for lupus nephritis. She's brought to the ER by her daughter today with the above complaints. Daughter reports progressive weakness, fatigue. The patient has been sleeping more than usual for the last couple of days. Apparently was supposed to have an Epo shot for anemia and was found to have hemoglobin of 6.3 and referred to the ER. Daughter denies any hematemesis melanoma hematochezia, rectal exam with guiac negative brown stool  Review of Systems: Positives bolded Constitutional:  No weight loss, night sweats, Fevers, chills, fatigue.  HEENT:  No headaches, Difficulty swallowing,Tooth/dental problems,Sore throat,  No sneezing, itching, ear ache, nasal congestion, post nasal drip,  Cardio-vascular:  No chest pain, Orthopnea, PND, swelling in lower extremities, anasarca, dizziness, palpitations  GI:  No heartburn, indigestion, abdominal pain, nausea, vomiting, diarrhea, change in bowel habits, loss of appetite  Resp:  shortness of breath with exertion. No excess mucus, no productive cough, No non-productive cough, No coughing up of blood.No change in color of mucus.No wheezing.No chest wall deformity  Skin:  no rash or lesions.  GU:  no dysuria, change in color of urine, no urgency or frequency. No flank pain.  Musculoskeletal:  No joint pain or swelling. No decreased range of motion. No back pain.  Psych:  No change in mood or affect. No depression or anxiety. No memory loss.   Past Medical History  Diagnosis Date  . ARTHRITIS, HAND   . ARTHRITIS, LEFT SHOULDER   . HEARING LOSS     . SMOKER   . CEREBROVASCULAR ACCIDENT, HX OF   . HYPERTENSION   . HYPERLIPIDEMIA   . HYPOTHYROIDISM   . OVERACTIVE BLADDER   . Cerebral aneurysm     s/p clipping  . Polymorphic ventricular tachycardia     s/p St Jude AICD (dual chamber) 12/2011;  Echo: mild LVH, EF 50-555, grade 1 diast dysfxn, trivial AI.   Erika Rivera Torsade de pointes     PVC dependent  . Syncope     2/2 VTach  . Bradycardia     A paced by AICD  . Essential tremor   . Osteopenia 11/18/2012    10/14/12 DEXA @ [facility]: -2.2  . Allergy   . Arthritis   . CAD (coronary artery disease)     LHC 12/2011: RCA 75% (neg FFR), EF 55-65% - med Tx  . Dementia   . Vitamin B 12 deficiency   . Hypothyroidism   . Chronic kidney disease, stage III (moderate)   . High blood pressure with chronic kidney disease   . Stroke     With rupture of intra-cranial aneurysm  . Polymorphic ventricular tachycardia     s/p ICD placement  . Proteinuria   . Lupus     Pt reports "lupus of skin"   Past Surgical History  Procedure Laterality Date  . Cardiac defibrillator placement  12/07/11    SJM ICD implanted for recurrent Torsades with syncope  . Aneursym clipping      cerebral  . Left heart catheterization with coronary angiogram N/A 12/07/2011    Procedure: LEFT HEART CATHETERIZATION WITH CORONARY ANGIOGRAM;  Surgeon: Sherren Mocha, MD;  Location: [facility] CATH LAB;  Service: Cardiovascular;  Laterality: N/A;  . Implantable cardioverter defibrillator implant N/A 12/07/2011    Procedure: IMPLANTABLE CARDIOVERTER DEFIBRILLATOR IMPLANT;  Surgeon: Thompson Grayer, MD;  Location: [facility] CATH LAB;  Service: Cardiovascular;  Laterality: N/A;   Social History:  reports that she quit smoking about 3 years ago. She has never used smokeless tobacco. She reports that she does not drink alcohol or use illicit drugs.  Allergies  Allergen Reactions  . Diphenhydramine Hcl Anaphylaxis    Tongue swelling  . Penicillins Rash    Family History  Problem Relation Age of Onset   . Gout Father   . Kidney disease Father     kidney failure  . Arthritis Other   . Hypertension Other   . Kidney disease Other   . Heart disease Other   . Thyroid disease Other   . Colon polyps Neg Hx   . Esophageal cancer Neg Hx   . Rectal cancer Neg Hx   . Stomach cancer Neg Hx     Prior to Admission medications   Medication Sig Start Date End Date Taking? Authorizing Provider  amLODipine (NORVASC) 10 MG tablet Take 1 tablet (10 mg total) by mouth daily. 12/26/14  Yes Rowe Clack, MD  aspirin 81 MG chewable tablet Chew 81 mg by mouth daily.   Yes Historical Provider, MD  Calcium Carbonate-Vitamin D (CALCIUM + D PO) Take 1 capsule by mouth da history of severe tremors ily.  12/12/12  Yes Historical Provider, MD  Cholecalciferol (VITAMIN D3) 5000 UNITS TABS Take 5,000 Units by mouth daily. 09/22/12  Yes Rowe Clack, MD  cyclophosphamide (CYTOXAN) 50 MG tablet Take 50 mg by mouth 3 (three) times daily. Give on an empty stomach 1 hour before or 2 hours after meals.   Yes Historical Provider, MD  donepezil (ARICEPT) 10 MG tablet Take 1 tablet (10 mg total) by mouth at bedtime. 12/10/14  Yes Rebecca S Tat, DO  levothyroxine (SYNTHROID, LEVOTHROID) 100 MCG tablet Take 1 tablet (100 mcg total) by mouth daily. 12/26/14  Yes Rowe Clack, MD  olmesartan (BENICAR) 40 MG tablet Take 1 tablet (40 mg total) by mouth daily. 12/26/14  Yes Rowe Clack, MD  potassium chloride (KLOR-CON M10) 10 MEQ tablet Take 1 tablet by mouth on Mon, Wed and Friday. 12/26/14  Yes Rowe Clack, MD  propranolol ER (INDERAL LA) 160 MG SR capsule Take 1 capsule (160 mg total) by mouth daily. 12/26/14  Yes Rowe Clack, MD  simvastatin (ZOCOR) 20 MG tablet Take 1 tablet (20 mg total) by mouth daily at 6 PM. 12/26/14  Yes Rowe Clack, MD  triamcinolone cream (KENALOG) 0.1 % Apply 1 application topically daily. 03/01/15  Yes Rowe Clack, MD   Physical Exam: Filed Vitals:    03/22/15 1400 03/22/15 1424 03/22/15 1430 03/22/15 1445  BP: 129/75 129/75 127/68 117/50  Pulse:  92    Temp:      TempSrc:      Resp: 16 18 15 16   SpO2:  98%      Wt Readings from Last 3 Encounters:  11/09/14 74.277 kg (163 lb 12 oz)  11/09/14 72.661 kg (160 lb 3 oz)  09/27/14 72.666 kg (160 lb 3.2 oz)    General:  Appears calm and comfortable, no distress, alert awake oriented to self and place only, very poor short-term recall, delayed response to questions, very hard of hearing  Eyes: PERRL, normal lids, irises &  conjunctiva ENT: grossly normal hearing, lips & tongue Neck: no LAD, masses or thyromegaly Cardiovascular: RRR, no m/r/g. Once 2+ bilateral lower extremity edema. Telemetry: SR, no arrhythmias  Respiratory: Diminished breath sounds to bases Normal respiratory effort. Abdomen: soft, ntnd Skin: Diffuse flaky skin Musculoskeletal: grossly normal tone BUE/BLE Psychiatric: grossly normal mood and affect, speech fluent and appropriate Neurologic: extremities, no localizing signs           Labs on Admission:  Basic Metabolic Panel:  Recent Labs Lab 03/22/15 1215  NA 140  K 3.4*  CL 117*  CO2 18*  GLUCOSE 90  BUN 20  CREATININE 2.92*  CALCIUM 8.5*   Liver Function Tests:  Recent Labs Lab 03/22/15 1215  AST 15  ALT 7*  ALKPHOS 57  BILITOT 0.7  PROT 5.7*  ALBUMIN 1.9*   No results for input(s): LIPASE, AMYLASE in the last 168 hours. No results for input(s): AMMONIA in the last 168 hours. CBC:  Recent Labs Lab 03/22/15 1059 03/22/15 1215  WBC  --  1.7*  HGB 6.3* 6.8*  HCT  --  20.6*  MCV  --  84.8  PLT  --  231   Cardiac Enzymes: No results for input(s): CKTOTAL, CKMB, CKMBINDEX, TROPONINI in the last 168 hours.  BNP (last 3 results) No results for input(s): BNP in the last 8760 hours.  ProBNP (last 3 results) No results for input(s): PROBNP in the last 8760 hours.  CBG: No results for input(s): GLUCAP in the last 168  hours.  Radiological Exams on Admission: No results found.  EKG: Independently reviewed. pending  Assessment/Plan  Symptomatic anemia -Due to cyclophosphamide and CKD most likely -Hemoccult-negative -Check anemia panel, with normal bilirubin do not suspect hemolysis -Transfused 2 units PRBC -Hold cyclophosphamide  Lupus nephritis AKI on CKD3 - hold the ARB -Monitor with volume expansion/transfusion -Renal consulted, cyclophosphamide held  Dementia -Stable continue aricept  Leukopenia  -suspect due to bone marrow suppression from cyclophosphamide, asked renal to evaluate to consider alternate therapy for lupus nephritis - Tremors -continue propranolol  HTN -continue amlodipine and hold propranolol  Code Status: Full Code DVT Prophylaxis: lovenox Family Communication: daughter at bedside Disposition Plan: inpatient  Time spent: 51min  Srah Ake [facility] Pager [phone]

## 2015-03-22 NOTE — ED Notes (Signed)
Pt sent from short stay with hemoglobin 6.3. Pt given Epogen today. Per daughter, pt has been weak and cold x 1 month. Pt denies any bleeding. VSS.

## 2015-03-23 DIAGNOSIS — T451X5A Adverse effect of antineoplastic and immunosuppressive drugs, initial encounter: Secondary | ICD-10-CM

## 2015-03-23 DIAGNOSIS — N179 Acute kidney failure, unspecified: Secondary | ICD-10-CM

## 2015-03-23 DIAGNOSIS — N183 Chronic kidney disease, stage 3 (moderate): Secondary | ICD-10-CM

## 2015-03-23 DIAGNOSIS — D6481 Anemia due to antineoplastic chemotherapy: Secondary | ICD-10-CM

## 2015-03-23 DIAGNOSIS — D649 Anemia, unspecified: Secondary | ICD-10-CM

## 2015-03-23 LAB — BASIC METABOLIC PANEL
Anion gap: 9 (ref 5–15)
BUN: 20 mg/dL (ref 6–20)
CALCIUM: 8.4 mg/dL — AB (ref 8.9–10.3)
CO2: 17 mmol/L — ABNORMAL LOW (ref 22–32)
Chloride: 114 mmol/L — ABNORMAL HIGH (ref 101–111)
Creatinine, Ser: 2.88 mg/dL — ABNORMAL HIGH (ref 0.44–1.00)
GFR calc Af Amer: 17 mL/min — ABNORMAL LOW (ref >60)
GFR, EST NON AFRICAN AMERICAN: 15 mL/min — AB (ref >60)
Glucose, Bld: 64 mg/dL — ABNORMAL LOW (ref 65–99)
POTASSIUM: 3.2 mmol/L — AB (ref 3.5–5.1)
SODIUM: 140 mmol/L (ref 135–145)

## 2015-03-23 LAB — TYPE AND SCREEN
ABO/RH(D): O POS
ANTIBODY SCREEN: NEGATIVE
UNIT DIVISION: 0
Unit division: 0

## 2015-03-23 LAB — CBC
HEMATOCRIT: 30.4 % — AB (ref 36.0–46.0)
HEMOGLOBIN: 10.4 g/dL — AB (ref 12.0–15.0)
MCH: 27.6 pg (ref 26.0–34.0)
MCHC: 34.2 g/dL (ref 30.0–36.0)
MCV: 80.6 fL (ref 78.0–100.0)
Platelets: 186 10*3/uL (ref 150–400)
RBC: 3.77 MIL/uL — AB (ref 3.87–5.11)
RDW: 15.8 % — ABNORMAL HIGH (ref 11.5–15.5)
WBC: 1.4 10*3/uL — AB (ref 4.0–10.5)

## 2015-03-23 MED ORDER — IRBESARTAN 150 MG PO TABS: 150.0000 mg | ORAL_TABLET | Freq: Every day | ORAL | Status: DC

## 2015-03-23 NOTE — Discharge Summary (Signed)
Discharge Summary  Erika Rivera:[id number] DOB: 1936/08/22  PCP: Gwendolyn Grant, MD  Admit date: 03/22/2015 Discharge date: 03/23/2015  Time spent: 25 minutes  Recommendations for Outpatient Follow-up:  1.  Patient will follow-up with her PCP in the next few weeks 2. Medication change: Cytoxan discontinued  Discharge Diagnoses:  Active Hospital Problems   Diagnosis Date Noted  . Symptomatic anemia 03/22/2015  . Anemia 03/22/2015    Resolved Hospital Problems   Diagnosis Date Noted Date Resolved  No resolved problems to display.    Discharge Condition: improved, being discharged home  Diet recommendation: low-sodium diet  Filed Weights   03/22/15 1545  Weight: 75 kg (165 lb 5.5 oz)    History of present illness:  79 year old female past oral history dementia, chronic kidney disease and lupus nephritis started on cyclophosphamide by nephrology month prior brought into the emergency room on 6/17 for complaints of progressive fatigue and weakness and increased somnolence. Found to have hemoglobin of 6.3. Patient was Hemoccult-negative on admission. Brought in the hospitalist service.  Hospital Course:  Active Problems:   Symptomatic anemia in the setting of chronic kidney disease and nephrotic syndrome from membranous nephropathy.: Patient transfused 2 units packed red blood cells on admission. Hemoglobin improved to 10.4 by following day. She received Epogen on day prior to admission. Etiology is felt to be secondary to chronic kidney disease plus cyclophosphamide. It was felt that there were no signs of acute kidney injury currently and it was felt cyclophosphamide should be held for now. Plan will be for patient to follow up with nephrology as scheduled in July. At this point he should medically felt to be stable for discharge     Procedures:  Status post 2 units packed red blood cells transfused 6/17  Consultations:  Nephrology  Discharge Exam: BP 128/61  mmHg  Pulse 70  Temp(Src) 97.8 F (36.6 C) (Oral)  Resp 16  Ht 5\' 5"  (1.651 m)  Wt 75 kg (165 lb 5.5 oz)  BMI 27.51 kg/m2  SpO2 100%  General: alert and oriented 2, no acute distress Cardiovascular: regular rate and rhythm, S1-S2 Respiratory: clear to auscultation bilaterally  Discharge Instructions You were cared for by a hospitalist during your hospital stay. If you have any questions about your discharge medications or the care you received while you were in the hospital after you are discharged, you can call the unit and asked to speak with the hospitalist on call if the hospitalist that took care of you is not available. Once you are discharged, your primary care physician will handle any further medical issues. Please note that NO REFILLS for any discharge medications will be authorized once you are discharged, as it is imperative that you return to your primary care physician (or establish a relationship with a primary care physician if you do not have one) for your aftercare needs so that they can reassess your need for medications and monitor your lab values.  Discharge Instructions    Diet - low sodium heart healthy    Complete by:  As directed      Increase activity slowly    Complete by:  As directed             Medication List    STOP taking these medications        cyclophosphamide 50 MG tablet  Commonly known as:  CYTOXAN      TAKE these medications        amLODipine 10 MG tablet  Commonly known as:  NORVASC  Take 1 tablet (10 mg total) by mouth daily.     aspirin 81 MG chewable tablet  Chew 81 mg by mouth daily.     CALCIUM + D PO  Take 1 capsule by mouth daily.     donepezil 10 MG tablet  Commonly known as:  ARICEPT  Take 1 tablet (10 mg total) by mouth at bedtime.     levothyroxine 100 MCG tablet  Commonly known as:  SYNTHROID, LEVOTHROID  Take 1 tablet (100 mcg total) by mouth daily.     olmesartan 40 MG tablet  Commonly known as:  BENICAR    Take 1 tablet (40 mg total) by mouth daily.     potassium chloride 10 MEQ tablet  Commonly known as:  KLOR-CON M10  Take 1 tablet by mouth on Mon, Wed and Friday.     propranolol ER 160 MG SR capsule  Commonly known as:  INDERAL LA  Take 1 capsule (160 mg total) by mouth daily.     simvastatin 20 MG tablet  Commonly known as:  ZOCOR  Take 1 tablet (20 mg total) by mouth daily at 6 PM.     triamcinolone cream 0.1 %  Commonly known as:  KENALOG  Apply 1 application topically daily.     Vitamin D3 5000 UNITS Tabs  Take 5,000 Units by mouth daily.       Allergies  Allergen Reactions  . Diphenhydramine Hcl Anaphylaxis    Tongue swelling  . Penicillins Rash      The results of significant diagnostics from this hospitalization (including imaging, microbiology, ancillary and laboratory) are listed below for reference.    Significant Diagnostic Studies: No results found.  Microbiology: No results found for this or any previous visit (from the past 240 hour(s)).   Labs: Basic Metabolic Panel:  Recent Labs Lab 03/22/15 1215 03/23/15 0356  NA 140 140  K 3.4* 3.2*  CL 117* 114*  CO2 18* 17*  GLUCOSE 90 64*  BUN 20 20  CREATININE 2.92* 2.88*  CALCIUM 8.5* 8.4*   Liver Function Tests:  Recent Labs Lab 03/22/15 1215  AST 15  ALT 7*  ALKPHOS 57  BILITOT 0.7  PROT 5.7*  ALBUMIN 1.9*   No results for input(s): LIPASE, AMYLASE in the last 168 hours. No results for input(s): AMMONIA in the last 168 hours. CBC:  Recent Labs Lab 03/22/15 1059 03/22/15 1215 03/23/15 0356  WBC  --  1.7* 1.4*  HGB 6.3* 6.8* 10.4*  HCT  --  20.6* 30.4*  MCV  --  84.8 80.6  PLT  --  231 186   Cardiac Enzymes: No results for input(s): CKTOTAL, CKMB, CKMBINDEX, TROPONINI in the last 168 hours. BNP: BNP (last 3 results) No results for input(s): BNP in the last 8760 hours.  ProBNP (last 3 results) No results for input(s): PROBNP in the last 8760 hours.  CBG: No results  for input(s): GLUCAP in the last 168 hours.     Signed:  Annita Brod  [facility] 03/23/2015, 6:20 PM

## 2015-03-23 NOTE — Progress Notes (Signed)
IV lock removed per order. Discharge instructions given and explained with teach back. Care Management in to see patient. Discharged via wheelchair to home with daughter. NT escorted.

## 2015-03-23 NOTE — Care Management Note (Signed)
Case Management Note  Patient Details  Name: Erika Rivera MRN: [id number] Date of Birth: 10-24-1935  Subjective/Objective:       Admitted with Anemia               Action/Plan:  Patient has order from Dr [place] Pink for [facility].  CM talked to patient and her daughter, says patient used [facility] and will prefer to use [facility] again.  Daughter [name] says she will support and supervise patient at home.  CM called [facility] at [phone] and arranged for HHPT. Expected Discharge Date:                  Expected Discharge Plan:  [place]  In-House Referral:     Discharge planning Services  CM Consult  Post Acute Care Choice:    Choice offered to:  Patient, Adult Children  DME Arranged:    DME Agency:     HH Arranged:  PT [place]:  [facility]  Status of Service:  Completed, signed off  Medicare Important Message Given:    Date Medicare IM Given:    Medicare IM give by:    Date Additional Medicare IM Given:    Additional Medicare Important Message give by:     If discussed at [place] of Stay Meetings, dates discussed:    Additional Comments:  Dimas Aguas, RN 03/23/2015, 1:54 PM

## 2015-03-23 NOTE — Progress Notes (Signed)
Admit: 03/22/2015 LOS: 1  71F with Membranous Nephropathy, CKD4-5 (Patel CKA< BL SCr high 2s), and worsened anemia and leukopenia after s tarting cytoxan.  Subjective:  2u PRBC yesterday Feels well this AM, hungry, ready to go home   06/17 0701 - 06/18 0700 In: 2059 [P.O.:240; I.V.:500; Blood:1319] Out: 200 [Urine:200]  Filed Weights   03/22/15 1545  Weight: 75 kg (165 lb 5.5 oz)    Scheduled Meds: . sodium chloride  10 mL/hr Intravenous Once  . amLODipine  10 mg Oral Daily  . aspirin  81 mg Oral Daily  . donepezil  10 mg Oral QHS  . enoxaparin (LOVENOX) injection  40 mg Subcutaneous Q24H  . levothyroxine  100 mcg Oral QAC breakfast  . propranolol ER  160 mg Oral Daily  . simvastatin  20 mg Oral q1800   Continuous Infusions:  PRN Meds:.acetaminophen **OR** acetaminophen, ondansetron **OR** ondansetron (ZOFRAN) IV  Current Labs: reviewed    Physical Exam:  Blood pressure 128/61, pulse 70, temperature 97.8 F (36.6 C), temperature source Oral, resp. rate 16, height 5\' 5"  (1.651 m), weight 75 kg (165 lb 5.5 oz), SpO2 100 %. GEN: NAD ENT: EOMI, EYES: EOMI CV: RRR, nl s1s2 no rub PULM: CTAB ABD: s/nd/d SKIN: no rashes/lesions EXT: 3+ LEE  A/P 1. 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 No evidence of AKI currently, continue on ARB 2. Agree with holding cyclophosphamide 3. Other possible medications to consider include corticosteroids and calcineurin inhibitors 4. Has f/u early July with Posey Pronto, defer this decision to him at that time 2. Symptomatic Anemia, normocytic 1. Etiology 2/2 CKD + Cyclophosphamide 2. S/p 2u PRBC 03/22/15, appropriate inc in Hb 3. Rec her ESA this 6/16 3. Hypervolemia 1. Not on diuretics at home 2. Stable currently and present several months, would favor watching now and limiting dietary Na 3. Would probably choose torsemide 20mg  qAM if needed 4. Leukopenia: as per #2 and 2/2 cyclophosphamide; should slowly improve  with dc of cytoxan 5. Immunosuppression 6. Dementia 7. HTN  Pearson Grippe MD 03/23/2015, 8:58 AM   Recent Labs Lab 03/22/15 1215 03/23/15 0356  NA 140 140  K 3.4* 3.2*  CL 117* 114*  CO2 18* 17*  GLUCOSE 90 64*  BUN 20 20  CREATININE 2.92* 2.88*  CALCIUM 8.5* 8.4*    Recent Labs Lab 03/22/15 1059 03/22/15 1215 03/23/15 0356  WBC  --  1.7* 1.4*  HGB 6.3* 6.8* 10.4*  HCT  --  20.6* 30.4*  MCV  --  84.8 80.6  PLT  --  231 186

## 2015-03-23 NOTE — Progress Notes (Signed)
CRITICAL VALUE ALERT  Critical value received:  WBC - 1.4  Date of notification:  03/23/15  Time of notification:  0625  Critical value read back: YES  Nurse who received alert:  Tor Netters  MD notified (1st page):  M. Lynch  Time of first page:  [phone]  MD notified (2nd page):  Time of second page:  Responding MD:  M. Donnal Debar  Time MD responded:  [phone]

## 2015-03-25 ENCOUNTER — Telehealth: Payer: Self-pay | Admitting: *Deleted

## 2015-03-25 NOTE — Telephone Encounter (Signed)
Daughter left msg on triage stating wanting md to recommend a vitamin or something to help mom with her appetite. Called [name] back inform her md is on vacation and before any other md would advise something she will need to make an appt. Daughter states she already has appt 04/10/15 with Dr. Asa Lente will discuss something then...[name]

## 2015-03-27 ENCOUNTER — Ambulatory Visit (INDEPENDENT_AMBULATORY_CARE_PROVIDER_SITE_OTHER): Payer: Commercial Managed Care - HMO | Admitting: *Deleted

## 2015-03-27 ENCOUNTER — Telehealth: Payer: Self-pay | Admitting: Internal Medicine

## 2015-03-27 DIAGNOSIS — I4729 Other ventricular tachycardia: Secondary | ICD-10-CM

## 2015-03-27 DIAGNOSIS — I472 Ventricular tachycardia: Secondary | ICD-10-CM

## 2015-03-27 NOTE — Telephone Encounter (Signed)
Called pt and was able to get in touch with dtr. Informed dtr that home care was trying to get in touch with her and/or pt. [employer] with [facility] and informed that dtr is now available.

## 2015-03-27 NOTE — Telephone Encounter (Signed)
Olean Ree from [facility] stated that patient just got home from the hospital, she had two falls today, she is ok, just a bruise on back of  left hand. Just wanted to let you know.

## 2015-03-27 NOTE — Progress Notes (Signed)
Remote ICD transmission.   

## 2015-03-27 NOTE — Telephone Encounter (Signed)
Eustaquio Maize [phone] with [facility]  Called in said that she has tried to get in touch with pt a few times and not able to get in touch with her

## 2015-03-29 ENCOUNTER — Encounter (HOSPITAL_COMMUNITY)
Admission: RE | Admit: 2015-03-29 | Discharge: 2015-03-29 | Disposition: A | Discharge status: Home / Self Care | Payer: Commercial Managed Care - HMO | Source: Ambulatory Visit | Attending: Nephrology | Admitting: Nephrology

## 2015-03-29 ENCOUNTER — Ambulatory Visit: Payer: Commercial Managed Care - HMO | Admitting: Podiatry

## 2015-03-29 DIAGNOSIS — Z79899 Other long term (current) drug therapy: Secondary | ICD-10-CM | POA: Diagnosis not present

## 2015-03-29 DIAGNOSIS — D631 Anemia in chronic kidney disease: Secondary | ICD-10-CM | POA: Diagnosis not present

## 2015-03-29 DIAGNOSIS — Z5181 Encounter for therapeutic drug level monitoring: Secondary | ICD-10-CM | POA: Diagnosis not present

## 2015-03-29 DIAGNOSIS — N183 Chronic kidney disease, stage 3 (moderate): Secondary | ICD-10-CM | POA: Insufficient documentation

## 2015-03-29 LAB — IRON AND TIBC
Iron: 67 ug/dL (ref 28–170)
Saturation Ratios: 40 % — ABNORMAL HIGH (ref 10.4–31.8)
TIBC: 167 ug/dL — ABNORMAL LOW (ref 250–450)
UIBC: 100 ug/dL

## 2015-03-29 LAB — POCT HEMOGLOBIN-HEMACUE: Hemoglobin: 10.1 g/dL — ABNORMAL LOW (ref 12.0–15.0)

## 2015-03-29 LAB — FERRITIN: Ferritin: 539 ng/mL — ABNORMAL HIGH (ref 11–307)

## 2015-03-29 MED ORDER — EPOETIN ALFA 10000 UNIT/ML IJ SOLN
10000.0000 [IU] | INTRAMUSCULAR | Status: DC
  Administered 2015-03-29: 10000 [IU] via SUBCUTANEOUS

## 2015-03-29 MED ORDER — EPOETIN ALFA 10000 UNIT/ML IJ SOLN
INTRAMUSCULAR | Status: AC
  Filled 2015-03-29: qty 1

## 2015-03-29 NOTE — Telephone Encounter (Signed)
Noted Thanks No changes recommended

## 2015-03-31 LAB — CUP PACEART REMOTE DEVICE CHECK
Battery Remaining Percentage: 57 %
Brady Statistic RV Percent Paced: 53 %
Date Time Interrogation Session: 20160626151728
HighPow Impedance: 48 Ohm
Lead Channel Impedance Value: 340 Ohm
Lead Channel Sensing Intrinsic Amplitude: 10.4 mV
Lead Channel Setting Sensing Sensitivity: 0.5 mV
MDC IDC MSMT LEADCHNL RA IMPEDANCE VALUE: 310 Ohm
MDC IDC MSMT LEADCHNL RA SENSING INTR AMPL: 0.9 mV
MDC IDC SET LEADCHNL RA PACING AMPLITUDE: 2 V
MDC IDC SET LEADCHNL RV PACING AMPLITUDE: 2.5 V
MDC IDC SET LEADCHNL RV PACING PULSEWIDTH: 0.5 ms
MDC IDC STAT BRADY RA PERCENT PACED: 96 %
Pulse Gen Serial Number: 7016049
Zone Setting Detection Interval: 320 ms
Zone Setting Detection Interval: 350 ms

## 2015-04-01 LAB — CUP PACEART REMOTE DEVICE CHECK
Battery Remaining Longevity: 3.8
Battery Remaining Percentage: 57 %
Brady Statistic RA Percent Paced: 96 %
Date Time Interrogation Session: 20160627142621
HighPow Impedance: 48 Ohm
Lead Channel Sensing Intrinsic Amplitude: 0.9 mV
Lead Channel Setting Pacing Amplitude: 2.5 V
MDC IDC MSMT LEADCHNL RA IMPEDANCE VALUE: 310 Ohm
MDC IDC MSMT LEADCHNL RV IMPEDANCE VALUE: 340 Ohm
MDC IDC MSMT LEADCHNL RV SENSING INTR AMPL: 10.4 mV
MDC IDC PG SERIAL: 7016049
MDC IDC SET LEADCHNL RA PACING AMPLITUDE: 2 V
MDC IDC SET LEADCHNL RV PACING PULSEWIDTH: 0.5 ms
MDC IDC SET LEADCHNL RV SENSING SENSITIVITY: 0.5 mV
MDC IDC STAT BRADY RV PERCENT PACED: 53 %
Zone Setting Detection Interval: 320 ms
Zone Setting Detection Interval: 350 ms

## 2015-04-05 ENCOUNTER — Encounter (HOSPITAL_COMMUNITY)
Admission: RE | Admit: 2015-04-05 | Discharge: 2015-04-05 | Disposition: A | Discharge status: Home / Self Care | Payer: Commercial Managed Care - HMO | Source: Ambulatory Visit | Attending: Nephrology | Admitting: Nephrology

## 2015-04-05 DIAGNOSIS — D631 Anemia in chronic kidney disease: Secondary | ICD-10-CM | POA: Insufficient documentation

## 2015-04-05 DIAGNOSIS — Z5181 Encounter for therapeutic drug level monitoring: Secondary | ICD-10-CM | POA: Insufficient documentation

## 2015-04-05 DIAGNOSIS — Z79899 Other long term (current) drug therapy: Secondary | ICD-10-CM | POA: Diagnosis not present

## 2015-04-05 DIAGNOSIS — N183 Chronic kidney disease, stage 3 (moderate): Secondary | ICD-10-CM | POA: Insufficient documentation

## 2015-04-05 LAB — POCT HEMOGLOBIN-HEMACUE: HEMOGLOBIN: 10.2 g/dL — AB (ref 12.0–15.0)

## 2015-04-05 MED ORDER — EPOETIN ALFA 10000 UNIT/ML IJ SOLN
10000.0000 [IU] | INTRAMUSCULAR | Status: DC
  Administered 2015-04-05: 10000 [IU] via SUBCUTANEOUS

## 2015-04-05 MED ORDER — EPOETIN ALFA 10000 UNIT/ML IJ SOLN
INTRAMUSCULAR | Status: AC
  Filled 2015-04-05: qty 1

## 2015-04-09 ENCOUNTER — Encounter: Payer: Self-pay | Admitting: Cardiology

## 2015-04-10 ENCOUNTER — Encounter: Payer: Self-pay | Admitting: Internal Medicine

## 2015-04-10 ENCOUNTER — Telehealth: Payer: Self-pay

## 2015-04-10 ENCOUNTER — Ambulatory Visit (INDEPENDENT_AMBULATORY_CARE_PROVIDER_SITE_OTHER): Payer: Commercial Managed Care - HMO | Admitting: Internal Medicine

## 2015-04-10 VITALS — BP 98/68 | HR 74 | Temp 97.4°F | Ht 65.0 in | Wt 162.2 lb

## 2015-04-10 DIAGNOSIS — N183 Chronic kidney disease, stage 3 unspecified: Secondary | ICD-10-CM

## 2015-04-10 DIAGNOSIS — R627 Adult failure to thrive: Secondary | ICD-10-CM | POA: Diagnosis not present

## 2015-04-10 DIAGNOSIS — I10 Essential (primary) hypertension: Secondary | ICD-10-CM | POA: Diagnosis not present

## 2015-04-10 DIAGNOSIS — D649 Anemia, unspecified: Secondary | ICD-10-CM | POA: Diagnosis not present

## 2015-04-10 MED ORDER — HYDROCHLOROTHIAZIDE 12.5 MG PO CAPS: 12.5000 mg | ORAL_CAPSULE | Freq: Every day | ORAL | Status: DC

## 2015-04-10 MED ORDER — FERROUS SULFATE 325 (65 FE) MG PO TABS: 325.0000 mg | ORAL_TABLET | Freq: Two times a day (BID) | ORAL | Status: AC

## 2015-04-10 NOTE — Assessment & Plan Note (Signed)
Following with renal - Dr Posey Pronto  Progressive decline per report -  No changes recommended

## 2015-04-10 NOTE — Progress Notes (Signed)
Subjective:    Patient ID: Erika Rivera, female    DOB: 1936/03/10, 79 y.o.   MRN: [id number]  HPI  Patient here for follow-up. Hospitalization mid June for symptomatic anemia Interval reviewed and current concerns including medication changes and increased leg swelling  Past Medical History  Diagnosis Date  . ARTHRITIS, HAND   . ARTHRITIS, LEFT SHOULDER   . HEARING LOSS   . SMOKER   . CEREBROVASCULAR ACCIDENT, HX OF   . HYPERTENSION   . HYPERLIPIDEMIA   . HYPOTHYROIDISM   . OVERACTIVE BLADDER   . Cerebral aneurysm     s/p clipping  . Polymorphic ventricular tachycardia     s/p St Jude AICD (dual chamber) 12/2011;  Echo: mild LVH, EF 50-555, grade 1 diast dysfxn, trivial AI.   Marland Kitchen Torsade de pointes     PVC dependent  . Syncope     2/2 VTach  . Bradycardia     A paced by AICD  . Essential tremor   . Osteopenia 11/18/2012    10/14/12 DEXA @ [facility]: -2.2  . Allergy   . Arthritis   . CAD (coronary artery disease)     [facility] 12/2011: RCA 75% (neg FFR), EF 55-65% - med Tx  . Dementia   . Vitamin B 12 deficiency   . Hypothyroidism   . Chronic kidney disease, stage III (moderate)   . High blood pressure with chronic kidney disease   . Stroke     With rupture of intra-cranial aneurysm  . Polymorphic ventricular tachycardia     s/p ICD placement  . Proteinuria   . Lupus     Pt reports "lupus of skin"    Review of Systems  Constitutional: Positive for appetite change and fatigue. Negative for fever.  Respiratory: Negative for cough and shortness of breath.   Cardiovascular: Positive for leg swelling.  Gastrointestinal: Negative for abdominal pain, diarrhea, blood in stool and anal bleeding.  Musculoskeletal: Positive for back pain (started yesterday).       Objective:    Physical Exam  Constitutional: She is oriented to person, place, and time. She appears well-developed and well-nourished. No distress.  2 dtrs at side  Cardiovascular: Normal rate, regular rhythm and  normal heart sounds.   No murmur heard. Pulmonary/Chest: Effort normal and breath sounds normal. No respiratory distress.  Musculoskeletal: She exhibits edema (2+ BLE).  Neurological: She is alert and oriented to person, place, and time. No cranial nerve deficit. Coordination normal.    BP 98/68 mmHg  Pulse 74  Temp(Src) 97.4 F (36.3 C) (Oral)  Ht 5\' 5"  (1.651 m)  Wt 162 lb 4 oz (73.596 kg)  BMI 27.00 kg/m2  SpO2 92% Wt Readings from Last 3 Encounters:  04/10/15 162 lb 4 oz (73.596 kg)  03/29/15 165 lb (74.844 kg)  03/22/15 165 lb 5.5 oz (75 kg)     Lab Results  Component Value Date   WBC 1.4* 03/23/2015   HGB 10.2* 04/05/2015   HCT 30.4* 03/23/2015   PLT 186 03/23/2015   GLUCOSE 64* 03/23/2015   CHOL 180 09/27/2014   TRIG 104.0 09/27/2014   HDL 76.90 09/27/2014   LDLDIRECT 138.6 04/23/2011   LDLCALC 82 09/27/2014   ALT 7* 03/22/2015   AST 15 03/22/2015   NA 140 03/23/2015   K 3.2* 03/23/2015   CL 114* 03/23/2015   CREATININE 2.88* 03/23/2015   BUN 20 03/23/2015   CO2 17* 03/23/2015   TSH 0.81 11/09/2014   INR  0.99 06/21/2014   HGBA1C 5.1 09/22/2012   MICROALBUR 64.5* 09/22/2013    No results found.     Assessment & Plan:   Problem List Items Addressed This Visit    CKD (chronic kidney disease) stage 3, GFR 30-59 ml/min    Following with renal - Dr Posey Pronto  Progressive decline per report -  No changes recommended       Relevant Orders   Ambulatory referral to [place] hypertension    BP Readings from Last 3 Encounters:  04/10/15 98/68  03/29/15 114/56  03/23/15 128/61   Added Bbloc propanlol to ARB/hct 04/2011 for BP control and RUE tremor symptoms  Amlodipine added by cards 03/2012  ARB and HCTZ stopped 03/2015 because of progressive renal decline - now felt to be CKD rather than AKI so will resume HCTZ given increasing BLE edema Encouraged compliance -no changes at this time      Relevant Medications   hydrochlorothiazide  (MICROZIDE) 12.5 MG capsule   Other Relevant Orders   Ambulatory referral to [place]   Symptomatic anemia - Primary    hosp for same 03/2015 when Hgb <7 - s/p 2 U PRBC transfusion 6/17 and ongoing weekly Epo for CKD each Fri Last CBC reviewed - stable Add oral FeS BID      Relevant Medications   ferrous sulfate 325 (65 FE) MG tablet   Other Relevant Orders   Ambulatory referral to [place]    Other Visit Diagnoses    FTT (failure to thrive) in adult        Relevant Orders    Ambulatory referral to Home Health        Gwendolyn Grant, MD

## 2015-04-10 NOTE — Assessment & Plan Note (Signed)
hosp for same 03/2015 when Hgb <7 - s/p 2 U PRBC transfusion 6/17 and ongoing weekly Epo for CKD each Fri Last CBC reviewed - stable Add oral FeS BID

## 2015-04-10 NOTE — Assessment & Plan Note (Signed)
BP Readings from Last 3 Encounters:  04/10/15 98/68  03/29/15 114/56  03/23/15 128/61   Added Bbloc propanlol to ARB/hct 04/2011 for BP control and RUE tremor symptoms  Amlodipine added by cards 03/2012  ARB and HCTZ stopped 03/2015 because of progressive renal decline - now felt to be CKD rather than AKI so will resume HCTZ given increasing BLE edema Encouraged compliance -no changes at this time

## 2015-04-10 NOTE — Telephone Encounter (Signed)
Spoke with Harold Hedge at [facility] was advised for pt to apply for personal care through [facility]. Pt need personal home care.

## 2015-04-10 NOTE — Progress Notes (Signed)
Pre visit review using our clinic review tool, if applicable. No additional management support is needed unless otherwise documented below in the visit note. 

## 2015-04-10 NOTE — Patient Instructions (Signed)
It was good to see you today.  We have reviewed your prior records including labs and tests today  Hospital labs and medications reviewed and updated Start iron pills 2x/day with meals and resume HCTZ 12.5 mg daily for fluid build up No other changes recommended at this time.   Your prescription(s) have been submitted to your pharmacy. Please take as directed and contact our office if you believe you are having problem(s) with the medication(s).  we'll make referral to [facility] for home health aide. Our office will contact you regarding appointment(s) once made.  Please schedule followup in 3-4 months, call sooner if problems.

## 2015-04-12 ENCOUNTER — Encounter: Payer: Self-pay | Admitting: Internal Medicine

## 2015-04-12 ENCOUNTER — Ambulatory Visit (INDEPENDENT_AMBULATORY_CARE_PROVIDER_SITE_OTHER): Payer: Commercial Managed Care - HMO | Admitting: Neurology

## 2015-04-12 ENCOUNTER — Encounter (HOSPITAL_COMMUNITY)
Admission: RE | Admit: 2015-04-12 | Discharge: 2015-04-12 | Disposition: A | Discharge status: Home / Self Care | Payer: Commercial Managed Care - HMO | Source: Ambulatory Visit | Attending: Nephrology | Admitting: Nephrology

## 2015-04-12 DIAGNOSIS — N183 Chronic kidney disease, stage 3 (moderate): Secondary | ICD-10-CM | POA: Diagnosis not present

## 2015-04-12 DIAGNOSIS — E538 Deficiency of other specified B group vitamins: Secondary | ICD-10-CM

## 2015-04-12 LAB — RENAL FUNCTION PANEL
ANION GAP: 7 (ref 5–15)
Albumin: 2 g/dL — ABNORMAL LOW (ref 3.5–5.0)
BUN: 29 mg/dL — ABNORMAL HIGH (ref 6–20)
CO2: 16 mmol/L — ABNORMAL LOW (ref 22–32)
Calcium: 8.5 mg/dL — ABNORMAL LOW (ref 8.9–10.3)
Chloride: 116 mmol/L — ABNORMAL HIGH (ref 101–111)
Creatinine, Ser: 3.59 mg/dL — ABNORMAL HIGH (ref 0.44–1.00)
GFR calc Af Amer: 13 mL/min — ABNORMAL LOW (ref >60)
GFR calc non Af Amer: 11 mL/min — ABNORMAL LOW (ref >60)
Glucose, Bld: 78 mg/dL (ref 65–99)
POTASSIUM: 3.8 mmol/L (ref 3.5–5.1)
Phosphorus: 4.2 mg/dL (ref 2.5–4.6)
Sodium: 139 mmol/L (ref 135–145)

## 2015-04-12 LAB — PROTEIN / CREATININE RATIO, URINE
CREATININE, URINE: 279.07 mg/dL
Protein Creatinine Ratio: 4.41 mg/mg{Cre} — ABNORMAL HIGH (ref 0.00–0.15)
Total Protein, Urine: 1230 mg/dL

## 2015-04-12 LAB — POCT HEMOGLOBIN-HEMACUE: Hemoglobin: 10.2 g/dL — ABNORMAL LOW (ref 12.0–15.0)

## 2015-04-12 MED ORDER — EPOETIN ALFA 10000 UNIT/ML IJ SOLN
INTRAMUSCULAR | Status: AC
  Filled 2015-04-12: qty 1

## 2015-04-12 MED ORDER — EPOETIN ALFA 10000 UNIT/ML IJ SOLN
10000.0000 [IU] | INTRAMUSCULAR | Status: DC
  Administered 2015-04-12: 10000 [IU] via SUBCUTANEOUS

## 2015-04-12 MED ORDER — CYANOCOBALAMIN 1000 MCG/ML IJ SOLN
1000.0000 ug | Freq: Once | INTRAMUSCULAR | Status: AC
  Administered 2015-04-12: 1000 ug via INTRAMUSCULAR

## 2015-04-12 NOTE — Progress Notes (Signed)
Patient here for B12 injection.  Injection given, patient tolerated well.

## 2015-04-15 LAB — POCT HEMOGLOBIN-HEMACUE: Hemoglobin: 7.8 g/dL — ABNORMAL LOW (ref 12.0–15.0)

## 2015-04-15 NOTE — Telephone Encounter (Signed)
[facility] form filled out for PCP to review.

## 2015-04-16 ENCOUNTER — Emergency Department (HOSPITAL_COMMUNITY): Payer: Commercial Managed Care - HMO

## 2015-04-16 ENCOUNTER — Inpatient Hospital Stay (HOSPITAL_COMMUNITY)
Admission: EM | Admit: 2015-04-16 | Discharge: 2015-04-21 | DRG: 871 | Disposition: A | Discharge status: Skilled Nursing Facility | Payer: Commercial Managed Care - HMO | Attending: Internal Medicine | Admitting: Internal Medicine

## 2015-04-16 ENCOUNTER — Encounter (HOSPITAL_COMMUNITY): Payer: Self-pay | Admitting: *Deleted

## 2015-04-16 ENCOUNTER — Telehealth: Payer: Self-pay | Admitting: Internal Medicine

## 2015-04-16 DIAGNOSIS — M545 Low back pain, unspecified: Secondary | ICD-10-CM | POA: Diagnosis present

## 2015-04-16 DIAGNOSIS — R1900 Intra-abdominal and pelvic swelling, mass and lump, unspecified site: Secondary | ICD-10-CM | POA: Diagnosis present

## 2015-04-16 DIAGNOSIS — N185 Chronic kidney disease, stage 5: Secondary | ICD-10-CM | POA: Diagnosis present

## 2015-04-16 DIAGNOSIS — R296 Repeated falls: Secondary | ICD-10-CM | POA: Diagnosis present

## 2015-04-16 DIAGNOSIS — N838 Other noninflammatory disorders of ovary, fallopian tube and broad ligament: Secondary | ICD-10-CM

## 2015-04-16 DIAGNOSIS — M8580 Other specified disorders of bone density and structure, unspecified site: Secondary | ICD-10-CM | POA: Diagnosis present

## 2015-04-16 DIAGNOSIS — N189 Chronic kidney disease, unspecified: Secondary | ICD-10-CM

## 2015-04-16 DIAGNOSIS — N3281 Overactive bladder: Secondary | ICD-10-CM | POA: Diagnosis present

## 2015-04-16 DIAGNOSIS — E039 Hypothyroidism, unspecified: Secondary | ICD-10-CM | POA: Diagnosis present

## 2015-04-16 DIAGNOSIS — I120 Hypertensive chronic kidney disease with stage 5 chronic kidney disease or end stage renal disease: Secondary | ICD-10-CM | POA: Diagnosis present

## 2015-04-16 DIAGNOSIS — A419 Sepsis, unspecified organism: Principal | ICD-10-CM | POA: Diagnosis present

## 2015-04-16 DIAGNOSIS — Z79899 Other long term (current) drug therapy: Secondary | ICD-10-CM

## 2015-04-16 DIAGNOSIS — Z87891 Personal history of nicotine dependence: Secondary | ICD-10-CM

## 2015-04-16 DIAGNOSIS — I959 Hypotension, unspecified: Secondary | ICD-10-CM | POA: Diagnosis present

## 2015-04-16 DIAGNOSIS — Z888 Allergy status to other drugs, medicaments and biological substances status: Secondary | ICD-10-CM

## 2015-04-16 DIAGNOSIS — D72819 Decreased white blood cell count, unspecified: Secondary | ICD-10-CM | POA: Diagnosis present

## 2015-04-16 DIAGNOSIS — Z8249 Family history of ischemic heart disease and other diseases of the circulatory system: Secondary | ICD-10-CM

## 2015-04-16 DIAGNOSIS — I129 Hypertensive chronic kidney disease with stage 1 through stage 4 chronic kidney disease, or unspecified chronic kidney disease: Secondary | ICD-10-CM | POA: Diagnosis present

## 2015-04-16 DIAGNOSIS — H9190 Unspecified hearing loss, unspecified ear: Secondary | ICD-10-CM | POA: Diagnosis present

## 2015-04-16 DIAGNOSIS — N179 Acute kidney failure, unspecified: Secondary | ICD-10-CM | POA: Diagnosis present

## 2015-04-16 DIAGNOSIS — M4856XA Collapsed vertebra, not elsewhere classified, lumbar region, initial encounter for fracture: Secondary | ICD-10-CM | POA: Diagnosis present

## 2015-04-16 DIAGNOSIS — Y95 Nosocomial condition: Secondary | ICD-10-CM | POA: Diagnosis present

## 2015-04-16 DIAGNOSIS — Z7982 Long term (current) use of aspirin: Secondary | ICD-10-CM

## 2015-04-16 DIAGNOSIS — F0390 Unspecified dementia without behavioral disturbance: Secondary | ICD-10-CM | POA: Diagnosis present

## 2015-04-16 DIAGNOSIS — M3214 Glomerular disease in systemic lupus erythematosus: Secondary | ICD-10-CM | POA: Diagnosis present

## 2015-04-16 DIAGNOSIS — M1990 Unspecified osteoarthritis, unspecified site: Secondary | ICD-10-CM | POA: Diagnosis present

## 2015-04-16 DIAGNOSIS — Z79891 Long term (current) use of opiate analgesic: Secondary | ICD-10-CM

## 2015-04-16 DIAGNOSIS — Z8673 Personal history of transient ischemic attack (TIA), and cerebral infarction without residual deficits: Secondary | ICD-10-CM

## 2015-04-16 DIAGNOSIS — G250 Essential tremor: Secondary | ICD-10-CM | POA: Diagnosis present

## 2015-04-16 DIAGNOSIS — Z95810 Presence of automatic (implantable) cardiac defibrillator: Secondary | ICD-10-CM

## 2015-04-16 DIAGNOSIS — R6520 Severe sepsis without septic shock: Secondary | ICD-10-CM | POA: Diagnosis present

## 2015-04-16 DIAGNOSIS — E785 Hyperlipidemia, unspecified: Secondary | ICD-10-CM | POA: Diagnosis present

## 2015-04-16 DIAGNOSIS — R109 Unspecified abdominal pain: Secondary | ICD-10-CM

## 2015-04-16 DIAGNOSIS — D649 Anemia, unspecified: Secondary | ICD-10-CM | POA: Diagnosis present

## 2015-04-16 DIAGNOSIS — Z880 Allergy status to penicillin: Secondary | ICD-10-CM

## 2015-04-16 DIAGNOSIS — W19XXXA Unspecified fall, initial encounter: Secondary | ICD-10-CM | POA: Diagnosis present

## 2015-04-16 DIAGNOSIS — Z515 Encounter for palliative care: Secondary | ICD-10-CM | POA: Insufficient documentation

## 2015-04-16 DIAGNOSIS — N183 Chronic kidney disease, stage 3 (moderate): Secondary | ICD-10-CM | POA: Diagnosis present

## 2015-04-16 DIAGNOSIS — J90 Pleural effusion, not elsewhere classified: Secondary | ICD-10-CM | POA: Insufficient documentation

## 2015-04-16 DIAGNOSIS — E872 Acidosis: Secondary | ICD-10-CM | POA: Diagnosis present

## 2015-04-16 DIAGNOSIS — J189 Pneumonia, unspecified organism: Secondary | ICD-10-CM | POA: Diagnosis present

## 2015-04-16 MED ORDER — OXYCODONE-ACETAMINOPHEN 5-325 MG PO TABS
1.0000 | ORAL_TABLET | Freq: Once | ORAL | Status: AC
  Administered 2015-04-16: 1 via ORAL
  Filled 2015-04-16: qty 1

## 2015-04-16 NOTE — ED Provider Notes (Signed)
TIME SEEN: 11:20 PM  CHIEF COMPLAINT: Back pain  HPI: Pt is a 79 y.o. female with history of hypertension, hyperlipidemia, hypothyroidism, dementia, history of V. tach status post pacemaker/defibrillator, prior stroke from ruptured cerebral aneurysm who presents emergency department with complaints of back pain for the past 3 days. Patient did have a fall 3 weeks ago per her family but no other recent injury. They state that she does chronically have mid line lower back pain intermittently but was complaining of right-sided back pain today which was concerning to them. States she would cry out in pain at home. She appears comfortable currently but has pain whenever she is moved. History is limited given her dementia. She denies any numbness, tingling or focal weakness. Family reports that she normally ambulate with a cane. They deny any fever, vomiting or diarrhea. She denies chest pain or abdominal pain.  ROS: See HPI Constitutional: no fever  Eyes: no drainage  ENT: no runny nose   Cardiovascular:  no chest pain  Resp: no SOB  GI: no vomiting GU: no dysuria Integumentary: no rash  Allergy: no hives  Musculoskeletal: no leg swelling  Neurological: no slurred speech ROS otherwise negative  PAST MEDICAL HISTORY/PAST SURGICAL HISTORY:  Past Medical History  Diagnosis Date  . ARTHRITIS, HAND   . ARTHRITIS, LEFT SHOULDER   . HEARING LOSS   . SMOKER   . CEREBROVASCULAR ACCIDENT, HX OF   . HYPERTENSION   . HYPERLIPIDEMIA   . HYPOTHYROIDISM   . OVERACTIVE BLADDER   . Cerebral aneurysm     s/p clipping  . Polymorphic ventricular tachycardia     s/p St Jude AICD (dual chamber) 12/2011;  Echo: mild LVH, EF 50-555, grade 1 diast dysfxn, trivial AI.   Marland Kitchen Torsade de pointes     PVC dependent  . Syncope     2/2 VTach  . Bradycardia     A paced by AICD  . Essential tremor   . Osteopenia 11/18/2012    10/14/12 DEXA @ [facility]: -2.2  . Allergy   . Arthritis   . CAD (coronary artery disease)    [facility] 12/2011: RCA 75% (neg FFR), EF 55-65% - med Tx  . Dementia   . Vitamin B 12 deficiency   . Hypothyroidism   . Chronic kidney disease, stage III (moderate)   . High blood pressure with chronic kidney disease   . Stroke     With rupture of intra-cranial aneurysm  . Polymorphic ventricular tachycardia     s/p ICD placement  . Proteinuria   . Lupus     Pt reports "lupus of skin"    MEDICATIONS:  Prior to Admission medications   Medication Sig Start Date End Date Taking? Authorizing Provider  amLODipine (NORVASC) 10 MG tablet Take 1 tablet (10 mg total) by mouth daily. 12/26/14  Yes Rowe Clack, MD  aspirin 81 MG chewable tablet Chew 81 mg by mouth daily.   Yes Historical Provider, MD  Calcium Carbonate-Vitamin D (CALCIUM + D PO) Take 1 capsule by mouth daily.  12/12/12  Yes Historical Provider, MD  Cholecalciferol (VITAMIN D) 2000 UNITS CAPS Take 4,000 Units by mouth daily.   Yes Historical Provider, MD  ciprofloxacin (CIPRO) 250 MG tablet Take 1 tablet by mouth daily. For 7 days 04/10/15 04/17/15 Yes Historical Provider, MD  donepezil (ARICEPT) 10 MG tablet Take 1 tablet (10 mg total) by mouth at bedtime. 12/10/14  Yes Rebecca S Tat, DO  ferrous sulfate 325 (65 FE)  MG tablet Take 1 tablet (325 mg total) by mouth 2 (two) times daily with a meal. Patient taking differently: Take 325 mg by mouth daily.  04/10/15  Yes Rowe Clack, MD  levothyroxine (SYNTHROID, LEVOTHROID) 100 MCG tablet Take 1 tablet (100 mcg total) by mouth daily. 12/26/14  Yes Rowe Clack, MD  potassium chloride (KLOR-CON M10) 10 MEQ tablet Take 1 tablet by mouth on Mon, Wed and Friday. 12/26/14  Yes Rowe Clack, MD  propranolol ER (INDERAL LA) 160 MG SR capsule Take 1 capsule (160 mg total) by mouth daily. 12/26/14  Yes Rowe Clack, MD  simvastatin (ZOCOR) 20 MG tablet Take 1 tablet (20 mg total) by mouth daily at 6 PM. 12/26/14  Yes Rowe Clack, MD  tiZANidine (ZANAFLEX) 4 MG tablet Take 4  mg by mouth every 6 (six) hours as needed for muscle spasms.   Yes Historical Provider, MD  traMADol (ULTRAM) 50 MG tablet Take 50 mg by mouth every 6 (six) hours as needed for moderate pain.   Yes Historical Provider, MD  triamcinolone cream (KENALOG) 0.1 % Apply 1 application topically daily. 03/01/15  Yes Rowe Clack, MD  Cholecalciferol (VITAMIN D3) 5000 UNITS TABS Take 5,000 Units by mouth daily. Patient not taking: Reported on 04/16/2015 09/22/12   Rowe Clack, MD  hydrochlorothiazide (MICROZIDE) 12.5 MG capsule Take 1 capsule (12.5 mg total) by mouth daily. Patient not taking: Reported on 04/16/2015 04/10/15   Rowe Clack, MD    ALLERGIES:  Allergies  Allergen Reactions  . Diphenhydramine Hcl Anaphylaxis    Tongue swelling  . Penicillins Rash    SOCIAL HISTORY:  History  Substance Use Topics  . Smoking status: Former Smoker -- 0.60 packs/day    Quit date: 11/28/2011  . 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 Alcohol Use: No    FAMILY HISTORY: Family History  Problem Relation Age of Onset  . Gout Father   . Kidney disease Father     kidney failure  . Arthritis Other   . Hypertension Other   . Kidney disease Other   . Heart disease Other   . Thyroid disease Other   . Colon polyps Neg Hx   . Esophageal cancer Neg Hx   . Rectal cancer Neg Hx   . Stomach cancer Neg Hx     EXAM: BP 97/49 mmHg  Pulse 69  Temp(Src) 97.3 F (36.3 C) (Oral)  Resp 16  SpO2 99% CONSTITUTIONAL: Alert and oriented to person and will respond to some questions appropriately intermittently, nontoxic, in no distress, elderly HEAD: Normocephalic EYES: Conjunctivae clear, PERRL ENT: normal nose; no rhinorrhea; moist mucous membranes; pharynx without lesions noted NECK: Supple, no meningismus, no LAD  CARD: RRR; S1 and S2 appreciated; no murmurs, no clicks, no rubs, no gallops RESP: Normal chest  excursion without splinting or tachypnea; breath sounds clear and equal bilaterally; no wheezes, no rhonchi, no rales, no hypoxia or respiratory distress, speaking full sentences ABD/GI: Normal bowel sounds; non-distended; soft, non-tender, no rebound, no guarding, no peritoneal signs BACK:  The back appears normal and is tender over the midline lumbar spinal tenderness without step-off or deformity, patient also has right flank pain on exam with some associated muscle spasm EXT: Normal ROM in all joints; non-tender to palpation; chronic bilateral lower extremity edema that is nonpitting; normal capillary refill; no cyanosis, no calf tenderness or  swelling, 2+ radial and DP pulses bilaterally    SKIN: Normal color for age and race; warm NEURO: Moves all extremities equally, sensation to light touch intact diffusely, cranial nerves II through XII intact PSYCH: The patient's mood and manner are appropriate. Grooming and personal hygiene are appropriate.  MEDICAL DECISION MAKING: Patient here with back pain. She is mildly hypotensive which family reports is chronic and on review of her records she has had blood pressures similar in the past. Family reports that her primary care doctor recently took her off of her amlodipine. She does have a history of chronic midline lumbar back pain but the right flank pain is new for her. Given her age and that her history is very limited given her dementia will obtain labs, urine, CT of her abdomen and pelvis to evaluate for other causes of back pain other than muscle strain, spasm.  Low suspicion for dissection at this time.  ED PROGRESS: 1:50 AM Patient's labs show bicarbonate 18, creatinine 3.74. Anion gap is normal. Baseline creatinine is normally around 2.2-2.5. We'll give IV fluids for her acute on chronic renal failure and slightly low blood pressure. CT scan shows L1 compression fracture with approximately 20% loss of height with minimal amount of bony  retropulsion. Likely the cause of some of her back pain. 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 Discussed with Dr. Kennon Rounds with GYN. They will see the patient in consult. Discussed with Dr. Hal Hope for admission to step down. Patient has bilateral pleural effusions with no hypoxia. Also has diffuse anasarca. Updated patient's family. At this time she is a full code and they would want full treatment for this malignancy.       Vaughn, DO 04/17/15 [phone]

## 2015-04-16 NOTE — ED Notes (Signed)
Waiting on MD evaluation.  

## 2015-04-16 NOTE — Telephone Encounter (Signed)
Pt called in said that pt back has been hurting her and nothing is helping.  She has tried tramadol and muscle relaxer and nothing is helping.  What else can she do?     Best number [phone]  Erika Rivera, Daughter

## 2015-04-16 NOTE — ED Notes (Signed)
Pt was seen by PCP last Wednesday for regular check up, mentioned to doctor about back pain but was not given her usual tramadol that she takes, has a hx of back pain, but Sunday it intensified, especially on R side, today pt was crying d/t pain, pt sitting in wheelchair at this time in no distress.

## 2015-04-17 ENCOUNTER — Inpatient Hospital Stay (HOSPITAL_COMMUNITY): Payer: Commercial Managed Care - HMO

## 2015-04-17 ENCOUNTER — Ambulatory Visit: Payer: Commercial Managed Care - HMO | Admitting: Podiatry

## 2015-04-17 ENCOUNTER — Encounter (HOSPITAL_COMMUNITY): Payer: Self-pay | Admitting: Internal Medicine

## 2015-04-17 DIAGNOSIS — E785 Hyperlipidemia, unspecified: Secondary | ICD-10-CM | POA: Diagnosis present

## 2015-04-17 DIAGNOSIS — Z7982 Long term (current) use of aspirin: Secondary | ICD-10-CM | POA: Diagnosis not present

## 2015-04-17 DIAGNOSIS — J90 Pleural effusion, not elsewhere classified: Secondary | ICD-10-CM | POA: Insufficient documentation

## 2015-04-17 DIAGNOSIS — N179 Acute kidney failure, unspecified: Secondary | ICD-10-CM | POA: Diagnosis not present

## 2015-04-17 DIAGNOSIS — W19XXXA Unspecified fall, initial encounter: Secondary | ICD-10-CM | POA: Diagnosis present

## 2015-04-17 DIAGNOSIS — N189 Chronic kidney disease, unspecified: Secondary | ICD-10-CM

## 2015-04-17 DIAGNOSIS — N185 Chronic kidney disease, stage 5: Secondary | ICD-10-CM | POA: Diagnosis present

## 2015-04-17 DIAGNOSIS — M545 Low back pain, unspecified: Secondary | ICD-10-CM | POA: Diagnosis present

## 2015-04-17 DIAGNOSIS — R296 Repeated falls: Secondary | ICD-10-CM | POA: Diagnosis present

## 2015-04-17 DIAGNOSIS — F0390 Unspecified dementia without behavioral disturbance: Secondary | ICD-10-CM | POA: Diagnosis present

## 2015-04-17 DIAGNOSIS — D72819 Decreased white blood cell count, unspecified: Secondary | ICD-10-CM | POA: Diagnosis present

## 2015-04-17 DIAGNOSIS — Z515 Encounter for palliative care: Secondary | ICD-10-CM | POA: Diagnosis not present

## 2015-04-17 DIAGNOSIS — J189 Pneumonia, unspecified organism: Secondary | ICD-10-CM | POA: Diagnosis present

## 2015-04-17 DIAGNOSIS — E872 Acidosis: Secondary | ICD-10-CM | POA: Diagnosis present

## 2015-04-17 DIAGNOSIS — I129 Hypertensive chronic kidney disease with stage 1 through stage 4 chronic kidney disease, or unspecified chronic kidney disease: Secondary | ICD-10-CM | POA: Diagnosis present

## 2015-04-17 DIAGNOSIS — Z79899 Other long term (current) drug therapy: Secondary | ICD-10-CM | POA: Diagnosis not present

## 2015-04-17 DIAGNOSIS — I959 Hypotension, unspecified: Secondary | ICD-10-CM | POA: Diagnosis present

## 2015-04-17 DIAGNOSIS — N3281 Overactive bladder: Secondary | ICD-10-CM | POA: Diagnosis present

## 2015-04-17 DIAGNOSIS — Z8673 Personal history of transient ischemic attack (TIA), and cerebral infarction without residual deficits: Secondary | ICD-10-CM | POA: Diagnosis not present

## 2015-04-17 DIAGNOSIS — E039 Hypothyroidism, unspecified: Secondary | ICD-10-CM | POA: Diagnosis present

## 2015-04-17 DIAGNOSIS — Z880 Allergy status to penicillin: Secondary | ICD-10-CM | POA: Diagnosis not present

## 2015-04-17 DIAGNOSIS — Z888 Allergy status to other drugs, medicaments and biological substances status: Secondary | ICD-10-CM | POA: Diagnosis not present

## 2015-04-17 DIAGNOSIS — Y95 Nosocomial condition: Secondary | ICD-10-CM | POA: Diagnosis present

## 2015-04-17 DIAGNOSIS — R6520 Severe sepsis without septic shock: Secondary | ICD-10-CM | POA: Diagnosis present

## 2015-04-17 DIAGNOSIS — I120 Hypertensive chronic kidney disease with stage 5 chronic kidney disease or end stage renal disease: Secondary | ICD-10-CM | POA: Diagnosis present

## 2015-04-17 DIAGNOSIS — Z8249 Family history of ischemic heart disease and other diseases of the circulatory system: Secondary | ICD-10-CM | POA: Diagnosis not present

## 2015-04-17 DIAGNOSIS — M4856XA Collapsed vertebra, not elsewhere classified, lumbar region, initial encounter for fracture: Secondary | ICD-10-CM | POA: Diagnosis present

## 2015-04-17 DIAGNOSIS — R1900 Intra-abdominal and pelvic swelling, mass and lump, unspecified site: Secondary | ICD-10-CM | POA: Insufficient documentation

## 2015-04-17 DIAGNOSIS — G250 Essential tremor: Secondary | ICD-10-CM | POA: Diagnosis present

## 2015-04-17 DIAGNOSIS — N183 Chronic kidney disease, stage 3 (moderate): Secondary | ICD-10-CM | POA: Diagnosis present

## 2015-04-17 DIAGNOSIS — Z87891 Personal history of nicotine dependence: Secondary | ICD-10-CM | POA: Diagnosis not present

## 2015-04-17 DIAGNOSIS — D649 Anemia, unspecified: Secondary | ICD-10-CM | POA: Diagnosis present

## 2015-04-17 DIAGNOSIS — A419 Sepsis, unspecified organism: Secondary | ICD-10-CM | POA: Diagnosis present

## 2015-04-17 DIAGNOSIS — H9190 Unspecified hearing loss, unspecified ear: Secondary | ICD-10-CM | POA: Diagnosis present

## 2015-04-17 DIAGNOSIS — Z95810 Presence of automatic (implantable) cardiac defibrillator: Secondary | ICD-10-CM | POA: Diagnosis not present

## 2015-04-17 DIAGNOSIS — M3214 Glomerular disease in systemic lupus erythematosus: Secondary | ICD-10-CM | POA: Diagnosis present

## 2015-04-17 DIAGNOSIS — M8580 Other specified disorders of bone density and structure, unspecified site: Secondary | ICD-10-CM | POA: Diagnosis present

## 2015-04-17 DIAGNOSIS — N838 Other noninflammatory disorders of ovary, fallopian tube and broad ligament: Secondary | ICD-10-CM | POA: Insufficient documentation

## 2015-04-17 DIAGNOSIS — N839 Noninflammatory disorder of ovary, fallopian tube and broad ligament, unspecified: Secondary | ICD-10-CM | POA: Diagnosis not present

## 2015-04-17 DIAGNOSIS — M1990 Unspecified osteoarthritis, unspecified site: Secondary | ICD-10-CM | POA: Diagnosis present

## 2015-04-17 DIAGNOSIS — Z79891 Long term (current) use of opiate analgesic: Secondary | ICD-10-CM | POA: Diagnosis not present

## 2015-04-17 LAB — TYPE AND SCREEN
ABO/RH(D): O POS
ANTIBODY SCREEN: NEGATIVE

## 2015-04-17 LAB — COMPREHENSIVE METABOLIC PANEL
ALBUMIN: 2.1 g/dL — AB (ref 3.5–5.0)
ALK PHOS: 76 U/L (ref 38–126)
ALT: 8 U/L — ABNORMAL LOW (ref 14–54)
ALT: 8 U/L — ABNORMAL LOW (ref 14–54)
ANION GAP: 7 (ref 5–15)
ANION GAP: 6 (ref 5–15)
AST: 15 U/L (ref 15–41)
AST: 16 U/L (ref 15–41)
Albumin: 2 g/dL — ABNORMAL LOW (ref 3.5–5.0)
Alkaline Phosphatase: 69 U/L (ref 38–126)
BUN: 34 mg/dL — AB (ref 6–20)
BUN: 36 mg/dL — ABNORMAL HIGH (ref 6–20)
CALCIUM: 8.3 mg/dL — AB (ref 8.9–10.3)
CHLORIDE: 114 mmol/L — AB (ref 101–111)
CO2: 16 mmol/L — AB (ref 22–32)
CO2: 18 mmol/L — AB (ref 22–32)
Calcium: 8.3 mg/dL — ABNORMAL LOW (ref 8.9–10.3)
Chloride: 115 mmol/L — ABNORMAL HIGH (ref 101–111)
Creatinine, Ser: 3.74 mg/dL — ABNORMAL HIGH (ref 0.44–1.00)
Creatinine, Ser: 3.74 mg/dL — ABNORMAL HIGH (ref 0.44–1.00)
GFR calc Af Amer: 13 mL/min — ABNORMAL LOW (ref >60)
GFR calc non Af Amer: 11 mL/min — ABNORMAL LOW (ref >60)
GFR, EST AFRICAN AMERICAN: 13 mL/min — AB (ref >60)
GFR, EST NON AFRICAN AMERICAN: 11 mL/min — AB (ref >60)
Glucose, Bld: 69 mg/dL (ref 65–99)
Glucose, Bld: 78 mg/dL (ref 65–99)
Potassium: 3.8 mmol/L (ref 3.5–5.1)
Potassium: 4.4 mmol/L (ref 3.5–5.1)
SODIUM: 138 mmol/L (ref 135–145)
Sodium: 138 mmol/L (ref 135–145)
Total Bilirubin: 0.7 mg/dL (ref 0.3–1.2)
Total Bilirubin: 0.8 mg/dL (ref 0.3–1.2)
Total Protein: 5.6 g/dL — ABNORMAL LOW (ref 6.5–8.1)
Total Protein: 5.8 g/dL — ABNORMAL LOW (ref 6.5–8.1)

## 2015-04-17 LAB — CBC WITH DIFFERENTIAL/PLATELET
Basophils Absolute: 0 10*3/uL (ref 0.0–0.1)
Basophils Absolute: 0 10*3/uL (ref 0.0–0.1)
Basophils Relative: 0 % (ref 0–1)
Basophils Relative: 1 % (ref 0–1)
EOS ABS: 0.2 10*3/uL (ref 0.0–0.7)
EOS PCT: 9 % — AB (ref 0–5)
EOS PCT: 7 % — AB (ref 0–5)
Eosinophils Absolute: 0.2 10*3/uL (ref 0.0–0.7)
HCT: 27.4 % — ABNORMAL LOW (ref 36.0–46.0)
HEMATOCRIT: 26.3 % — AB (ref 36.0–46.0)
HEMOGLOBIN: 9 g/dL — AB (ref 12.0–15.0)
Hemoglobin: 8.7 g/dL — ABNORMAL LOW (ref 12.0–15.0)
LYMPHS ABS: 0.7 10*3/uL (ref 0.7–4.0)
Lymphocytes Relative: 26 % (ref 12–46)
Lymphocytes Relative: 26 % (ref 12–46)
Lymphs Abs: 0.6 10*3/uL — ABNORMAL LOW (ref 0.7–4.0)
MCH: 30.9 pg (ref 26.0–34.0)
MCH: 30.8 pg (ref 26.0–34.0)
MCHC: 32.8 g/dL (ref 30.0–36.0)
MCHC: 33.1 g/dL (ref 30.0–36.0)
MCV: 93.3 fL (ref 78.0–100.0)
MCV: 93.8 fL (ref 78.0–100.0)
MONO ABS: 0.4 10*3/uL (ref 0.1–1.0)
Monocytes Absolute: 0.3 10*3/uL (ref 0.1–1.0)
Monocytes Relative: 16 % — ABNORMAL HIGH (ref 3–12)
Monocytes Relative: 12 % (ref 3–12)
NEUTROS ABS: 1.2 10*3/uL — AB (ref 1.7–7.7)
NEUTROS ABS: 1.2 10*3/uL — AB (ref 1.7–7.7)
NEUTROS PCT: 51 % (ref 43–77)
Neutrophils Relative %: 52 % (ref 43–77)
Platelets: 203 10*3/uL (ref 150–400)
Platelets: 200 10*3/uL (ref 150–400)
RBC: 2.82 MIL/uL — ABNORMAL LOW (ref 3.87–5.11)
RBC: 2.92 MIL/uL — AB (ref 3.87–5.11)
RDW: 23 % — AB (ref 11.5–15.5)
RDW: 22.3 % — AB (ref 11.5–15.5)
WBC: 2.5 10*3/uL — ABNORMAL LOW (ref 4.0–10.5)
WBC: 2.3 10*3/uL — AB (ref 4.0–10.5)

## 2015-04-17 LAB — PROCALCITONIN

## 2015-04-17 LAB — URINALYSIS, ROUTINE W REFLEX MICROSCOPIC
Glucose, UA: 100 mg/dL — AB
Ketones, ur: NEGATIVE mg/dL
Leukocytes, UA: NEGATIVE
NITRITE: NEGATIVE
SPECIFIC GRAVITY, URINE: 1.018 (ref 1.005–1.030)
UROBILINOGEN UA: 0.2 mg/dL (ref 0.0–1.0)
pH: 5.5 (ref 5.0–8.0)

## 2015-04-17 LAB — TSH: TSH: 1.088 u[IU]/mL (ref 0.350–4.500)

## 2015-04-17 LAB — STREP PNEUMONIAE URINARY ANTIGEN: Strep Pneumo Urinary Antigen: NEGATIVE

## 2015-04-17 LAB — URINE MICROSCOPIC-ADD ON

## 2015-04-17 LAB — LACTIC ACID, PLASMA
LACTIC ACID, VENOUS: 0.9 mmol/L (ref 0.5–2.0)
LACTIC ACID, VENOUS: 1 mmol/L (ref 0.5–2.0)
LACTIC ACID, VENOUS: 1.1 mmol/L (ref 0.5–2.0)

## 2015-04-17 LAB — CORTISOL: Cortisol, Plasma: 7.8 ug/dL

## 2015-04-17 LAB — MRSA PCR SCREENING: MRSA BY PCR: NEGATIVE

## 2015-04-17 LAB — ABO/RH: ABO/RH(D): O POS

## 2015-04-17 MED ORDER — SODIUM CHLORIDE 0.9 % IJ SOLN
3.0000 mL | Freq: Two times a day (BID) | INTRAMUSCULAR | Status: DC
  Administered 2015-04-17 – 2015-04-21 (×9): 3 mL via INTRAVENOUS

## 2015-04-17 MED ORDER — MORPHINE SULFATE 2 MG/ML IJ SOLN: 1.0000 mg | INTRAMUSCULAR | Status: DC | PRN

## 2015-04-17 MED ORDER — VITAMIN D 1000 UNITS PO TABS
4000.0000 [IU] | ORAL_TABLET | Freq: Every day | ORAL | Status: DC
  Administered 2015-04-17 – 2015-04-21 (×5): 4000 [IU] via ORAL
  Filled 2015-04-17 (×6): qty 4

## 2015-04-17 MED ORDER — VANCOMYCIN HCL IN DEXTROSE 1-5 GM/200ML-% IV SOLN
1000.0000 mg | Freq: Once | INTRAVENOUS | Status: AC
  Administered 2015-04-17: 1000 mg via INTRAVENOUS
  Filled 2015-04-17: qty 200

## 2015-04-17 MED ORDER — FERROUS SULFATE 325 (65 FE) MG PO TABS
325.0000 mg | ORAL_TABLET | Freq: Every day | ORAL | Status: DC
  Administered 2015-04-17 – 2015-04-21 (×5): 325 mg via ORAL
  Filled 2015-04-17 (×5): qty 1

## 2015-04-17 MED ORDER — DONEPEZIL HCL 10 MG PO TABS
10.0000 mg | ORAL_TABLET | Freq: Every day | ORAL | Status: DC
  Administered 2015-04-17 – 2015-04-20 (×4): 10 mg via ORAL
  Filled 2015-04-17 (×5): qty 1

## 2015-04-17 MED ORDER — TRAMADOL HCL 50 MG PO TABS
50.0000 mg | ORAL_TABLET | Freq: Four times a day (QID) | ORAL | Status: DC | PRN
Start: 2015-04-17 — End: 2015-04-21
  Administered 2015-04-18 – 2015-04-19 (×2): 50 mg via ORAL
  Filled 2015-04-17 (×3): qty 1

## 2015-04-17 MED ORDER — ASPIRIN 81 MG PO CHEW
81.0000 mg | CHEWABLE_TABLET | Freq: Every day | ORAL | Status: DC
  Administered 2015-04-17 – 2015-04-21 (×5): 81 mg via ORAL
  Filled 2015-04-17 (×5): qty 1

## 2015-04-17 MED ORDER — ENOXAPARIN SODIUM 30 MG/0.3ML ~~LOC~~ SOLN
30.0000 mg | SUBCUTANEOUS | Status: DC
  Administered 2015-04-17 – 2015-04-21 (×5): 30 mg via SUBCUTANEOUS
  Filled 2015-04-17 (×5): qty 0.3

## 2015-04-17 MED ORDER — ACETAMINOPHEN 650 MG RE SUPP: 650.0000 mg | Freq: Four times a day (QID) | RECTAL | Status: DC | PRN

## 2015-04-17 MED ORDER — ONDANSETRON HCL 4 MG/2ML IJ SOLN
4.0000 mg | Freq: Four times a day (QID) | INTRAMUSCULAR | Status: DC | PRN
  Administered 2015-04-20: 4 mg via INTRAVENOUS
  Filled 2015-04-17: qty 2

## 2015-04-17 MED ORDER — TIZANIDINE HCL 4 MG PO TABS
4.0000 mg | ORAL_TABLET | Freq: Four times a day (QID) | ORAL | Status: DC | PRN
  Filled 2015-04-17 (×2): qty 1

## 2015-04-17 MED ORDER — SODIUM CHLORIDE 0.9 % IV BOLUS (SEPSIS)
1000.0000 mL | Freq: Once | INTRAVENOUS | Status: AC
  Administered 2015-04-17: 1000 mL via INTRAVENOUS

## 2015-04-17 MED ORDER — DEXTROSE 5 % IV SOLN
1.0000 g | Freq: Three times a day (TID) | INTRAVENOUS | Status: DC
  Administered 2015-04-17 – 2015-04-19 (×7): 1 g via INTRAVENOUS
  Filled 2015-04-17 (×9): qty 1

## 2015-04-17 MED ORDER — SIMVASTATIN 20 MG PO TABS
20.0000 mg | ORAL_TABLET | Freq: Every day | ORAL | Status: DC
  Administered 2015-04-17 – 2015-04-20 (×4): 20 mg via ORAL
  Filled 2015-04-17: qty 2
  Filled 2015-04-17: qty 1
  Filled 2015-04-17: qty 2
  Filled 2015-04-17 (×2): qty 1

## 2015-04-17 MED ORDER — VANCOMYCIN HCL IN DEXTROSE 1-5 GM/200ML-% IV SOLN
1000.0000 mg | INTRAVENOUS | Status: DC
  Administered 2015-04-19: 1000 mg via INTRAVENOUS
  Filled 2015-04-17: qty 200

## 2015-04-17 MED ORDER — SODIUM CHLORIDE 0.9 % IV SOLN: INTRAVENOUS | Status: DC

## 2015-04-17 MED ORDER — LEVOTHYROXINE SODIUM 100 MCG PO TABS
100.0000 ug | ORAL_TABLET | Freq: Every day | ORAL | Status: DC
  Administered 2015-04-17 – 2015-04-21 (×5): 100 ug via ORAL
  Filled 2015-04-17 (×5): qty 2
  Filled 2015-04-17: qty 1

## 2015-04-17 MED ORDER — ACETAMINOPHEN 325 MG PO TABS: 650.0000 mg | ORAL_TABLET | Freq: Four times a day (QID) | ORAL | Status: DC | PRN

## 2015-04-17 MED ORDER — ONDANSETRON HCL 4 MG PO TABS: 4.0000 mg | ORAL_TABLET | Freq: Four times a day (QID) | ORAL | Status: DC | PRN

## 2015-04-17 MED ORDER — DEXTROSE 5 % IV SOLN
1.0000 g | Freq: Three times a day (TID) | INTRAVENOUS | Status: DC
  Filled 2015-04-17: qty 1

## 2015-04-17 NOTE — H&P (Addendum)
[facility] History and Physical  Erika Rivera YQM:[id number] DOB: 1936-02-07 DOA: 04/16/2015  Referring physician: Dr. Leonides Schanz. PCP: Gwendolyn Grant, MD  Specialists: Dr. Posey Pronto. Nephrologist.  Chief Complaint: Low back pain.  History obtained from patient's daughter as patient has dementia.  HPI: Erika Rivera is a 79 y.o. female with history of chronic kidney disease and lupus nephritis, chronic anemia, history of AICD placement for torsades who was admitted last month for anemia and has received transfusion was brought to the ER after patient was complaining of worsening low back pain. As per patient's daughter patient has had a fall 3 weeks ago. Since then patient has been progressive low back pain. In the ER patient had CT renal study which showed L1 compression fracture which was age indeterminant with pelvic mass concerning for ovarian tumor. Patient also is found to have low normal blood pressure with worsening renal function. Patient was given 1 L normal saline bolus following which patient blood pressure improved. On my exam patient is also found to have hypothermia with temperature around 66F.    Review of Systems: As presented in the history of presenting illness, rest negative.  Past Medical History  Diagnosis Date  . ARTHRITIS, HAND   . ARTHRITIS, LEFT SHOULDER   . HEARING LOSS   . SMOKER   . CEREBROVASCULAR ACCIDENT, HX OF   . HYPERTENSION   . HYPERLIPIDEMIA   . HYPOTHYROIDISM   . OVERACTIVE BLADDER   . Cerebral aneurysm     s/p clipping  . Polymorphic ventricular tachycardia     s/p St Jude AICD (dual chamber) 12/2011;  Echo: mild LVH, EF 50-555, grade 1 diast dysfxn, trivial AI.   Marland Kitchen Torsade de pointes     PVC dependent  . Syncope     2/2 VTach  . Bradycardia     A paced by AICD  . Essential tremor   . Osteopenia 11/18/2012    10/14/12 DEXA @ [facility]: -2.2  . Allergy   . Arthritis   . CAD (coronary artery disease)     LHC 12/2011: RCA 75% (neg FFR), EF  55-65% - med Tx  . Dementia   . Vitamin B 12 deficiency   . Hypothyroidism   . Chronic kidney disease, stage III (moderate)   . High blood pressure with chronic kidney disease   . Stroke     With rupture of intra-cranial aneurysm  . Polymorphic ventricular tachycardia     s/p ICD placement  . Proteinuria   . Lupus     Pt reports "lupus of skin"   Past Surgical History  Procedure Laterality Date  . Cardiac defibrillator placement  12/07/11    SJM ICD implanted for recurrent Torsades with syncope  . Aneursym clipping      cerebral  . Left heart catheterization with coronary angiogram N/A 12/07/2011    Procedure: LEFT HEART CATHETERIZATION WITH CORONARY ANGIOGRAM;  Surgeon: Sherren Mocha, MD;  Location: [facility] CATH LAB;  Service: Cardiovascular;  Laterality: N/A;  . Implantable cardioverter defibrillator implant N/A 12/07/2011    Procedure: IMPLANTABLE CARDIOVERTER DEFIBRILLATOR IMPLANT;  Surgeon: Thompson Grayer, MD;  Location: [facility] CATH LAB;  Service: Cardiovascular;  Laterality: N/A;   Social History:  reports that she quit smoking about 3 years ago. She has never used smokeless tobacco. She reports that she does not drink alcohol or use illicit drugs. Where does patient live home. Can patient participate in ADLs? No.  Allergies  Allergen Reactions  . Diphenhydramine Hcl Anaphylaxis  Tongue swelling  . Penicillins Rash    Family History:  Family History  Problem Relation Age of Onset  . Gout Father   . Kidney disease Father     kidney failure  . Arthritis Other   . Hypertension Other   . Kidney disease Other   . Heart disease Other   . Thyroid disease Other   . Colon polyps Neg Hx   . Esophageal cancer Neg Hx   . Rectal cancer Neg Hx   . Stomach cancer Neg Hx       Prior to Admission medications   Medication Sig Start Date End Date Taking? Authorizing Provider  amLODipine (NORVASC) 10 MG tablet Take 1 tablet (10 mg total) by mouth daily. 12/26/14  Yes Rowe Clack, MD   aspirin 81 MG chewable tablet Chew 81 mg by mouth daily.   Yes Historical Provider, MD  Calcium Carbonate-Vitamin D (CALCIUM + D PO) Take 1 capsule by mouth daily.  12/12/12  Yes Historical Provider, MD  Cholecalciferol (VITAMIN D) 2000 UNITS CAPS Take 4,000 Units by mouth daily.   Yes Historical Provider, MD  ciprofloxacin (CIPRO) 250 MG tablet Take 1 tablet by mouth daily. For 7 days 04/10/15 04/17/15 Yes Historical Provider, MD  donepezil (ARICEPT) 10 MG tablet Take 1 tablet (10 mg total) by mouth at bedtime. 12/10/14  Yes Rebecca S Tat, DO  ferrous sulfate 325 (65 FE) MG tablet Take 1 tablet (325 mg total) by mouth 2 (two) times daily with a meal. Patient taking differently: Take 325 mg by mouth daily.  04/10/15  Yes Rowe Clack, MD  levothyroxine (SYNTHROID, LEVOTHROID) 100 MCG tablet Take 1 tablet (100 mcg total) by mouth daily. 12/26/14  Yes Rowe Clack, MD  potassium chloride (KLOR-CON M10) 10 MEQ tablet Take 1 tablet by mouth on Mon, Wed and Friday. 12/26/14  Yes Rowe Clack, MD  propranolol ER (INDERAL LA) 160 MG SR capsule Take 1 capsule (160 mg total) by mouth daily. 12/26/14  Yes Rowe Clack, MD  simvastatin (ZOCOR) 20 MG tablet Take 1 tablet (20 mg total) by mouth daily at 6 PM. 12/26/14  Yes Rowe Clack, MD  tiZANidine (ZANAFLEX) 4 MG tablet Take 4 mg by mouth every 6 (six) hours as needed for muscle spasms.   Yes Historical Provider, MD  traMADol (ULTRAM) 50 MG tablet Take 50 mg by mouth every 6 (six) hours as needed for moderate pain.   Yes Historical Provider, MD  triamcinolone cream (KENALOG) 0.1 % Apply 1 application topically daily. 03/01/15  Yes Rowe Clack, MD  Cholecalciferol (VITAMIN D3) 5000 UNITS TABS Take 5,000 Units by mouth daily. Patient not taking: Reported on 04/16/2015 09/22/12   Rowe Clack, MD  hydrochlorothiazide (MICROZIDE) 12.5 MG capsule Take 1 capsule (12.5 mg total) by mouth daily. Patient not taking: Reported on  04/16/2015 04/10/15   Rowe Clack, MD    Physical Exam: Filed Vitals:   04/16/15 2022 04/16/15 2304 04/17/15 0251  BP: 98/58 97/49 93/59   Pulse: 70 69 67  Temp: 97.4 F (36.3 C) 97.3 F (36.3 C) 96.5 F (35.8 C)  TempSrc: Oral Oral Rectal  Resp: 16 16   SpO2: 95% 99% 97%     General:  Moderately built and nourished.  Eyes: Anicteric no pallor.  ENT: No discharge from the ears eyes nose and mouth.  Neck: No mass felt.  Cardiovascular: S1 and S2 heard.  Respiratory: No rhonchi or crepitations.  Abdomen: Soft  nontender bowel sounds present.  Skin: No rash.  Musculoskeletal: Mild edema.  Psychiatric: Patient has dementia.  Neurologic: Alert awake oriented to her name moves all extremities.  Labs on Admission:  Basic Metabolic Panel:  Recent Labs Lab 04/12/15 1417 04/16/15 2346  NA 139 138  K 3.8 4.4  CL 116* 114*  CO2 16* 18*  GLUCOSE 78 69  BUN 29* 36*  CREATININE 3.59* 3.74*  CALCIUM 8.5* 8.3*  PHOS 4.2  --    Liver Function Tests:  Recent Labs Lab 04/12/15 1417 04/16/15 2346  AST  --  15  ALT  --  8*  ALKPHOS  --  69  BILITOT  --  0.8  PROT  --  5.6*  ALBUMIN 2.0* 2.0*   No results for input(s): LIPASE, AMYLASE in the last 168 hours. No results for input(s): AMMONIA in the last 168 hours. CBC:  Recent Labs Lab 04/12/15 1358 04/12/15 1419 04/16/15 2346  WBC  --   --  2.5*  NEUTROABS  --   --  1.2*  HGB 7.8* 10.2* 8.7*  HCT  --   --  26.3*  MCV  --   --  93.3  PLT  --   --  203   Cardiac Enzymes: No results for input(s): CKTOTAL, CKMB, CKMBINDEX, TROPONINI in the last 168 hours.  BNP (last 3 results) No results for input(s): BNP in the last 8760 hours.  ProBNP (last 3 results) No results for input(s): PROBNP in the last 8760 hours.  CBG: No results for input(s): GLUCAP in the last 168 hours.  Radiological Exams on Admission: Ct Renal Stone Study  04/17/2015   CLINICAL DATA:  Initial evaluation for lower back pain  with right flank pain.  EXAM: CT ABDOMEN AND PELVIS WITHOUT CONTRAST  TECHNIQUE: Multidetector CT imaging of the abdomen and pelvis was performed following the standard protocol without IV contrast.  COMPARISON:  None.  FINDINGS: Small layering bilateral pleural effusions are present, left slightly larger than right. There is associated mild bibasilar atelectasis. Cardiac pacemaker electrodes partially visualized. No pericardial effusion.  Limited noncontrast evaluation of the liver is unremarkable. Hyperdensity within the gallbladder lumen most compatible with stones and/or sludge. No evidence for acute cholecystitis. No biliary dilatation. Spleen and adrenal glands are within normal limits. A few scattered dystrophic calcifications noted within the pancreatic body. Pancreas is otherwise unremarkable.  3.8 cm cyst present within the interpolar right kidney. Additional 1.6 cm cyst present within the upper pole of the right kidney. Additional subcentimeter hypodensity within the interpolar right kidney is too small the characterize by CT, but may reflect an additional small cyst. No evidence for right-sided nephrolithiasis or hydronephrosis. No radiopaque calculi seen along the course of the right renal collecting system. There is no right-sided hydroureter PICC  4 cm cyst present within the interpolar left kidney. There is an additional 1.5 cm cyst immediately posteriorly within the left kidney. No evidence for left-sided nephrolithiasis or hydronephrosis. Calcification at the left renal hilum favored to be vascular in nature. No radiopaque calculi seen along the course of the left renal collecting system. There is no left-sided hydroureter.  Small hiatal hernia noted. Stomach otherwise unremarkable. No evidence for bowel obstruction. Appendix within normal limits. No acute inflammatory changes seen about the bowels.  Bladder is largely decompressed but grossly unremarkable.  There is an ovoid mass that measures 8.6  x 6.9 x 6.2 cm centered in the mid pelvis (series 2, image 69). This lesion demonstrates internal  heterogeneous attenuation with fluid and soft tissue density. There is a small amount of adjacent free fluid within the pelvis. Is unclear whether this lesion is ovarian in nature. This lies immediately posterior to the uterus, and appears separate from the uterus with a thin cleft of fluid dividing the two. Peripheral soft tissue about this lesion may reflect normal ovarian tissue flattened around the periphery of the lesion. There appears to be a normal left ovary laterally (series 2, image 68). Right ovary is not discretely identified. The distal colon lies immediately posterior to this lesion.  No free intraperitoneal air. No peritoneal studding or carcinomatosis. No pathologically enlarged lymph nodes. Advanced aorto bi-iliac atherosclerotic calcifications present. No aneurysm.  Diffuse anasarca noted within the external soft tissues.  Scoliosis with multilevel degenerative disc disease present. Thin sclerosis with slight height loss present through the superior aspect of the L1 vertebral body, consistent with an age-indeterminate compression fracture. This may be acute to subacute in nature. Minimal bony retropulsion of approximately 2 mm. Central height loss measures approximately 20-30%. No other acute osseous abnormality. No worrisome lytic or blastic osseous lesion.  IMPRESSION: 1. 8.6 x 6.9 x 6.2 cm heterogeneous mass centered within the mid pelvis with small amount of adjacent free fluid. This lesion is indeterminate, but worrisome for possible primary ovarian malignancy. Further evaluation with pelvic ultrasound with gynecologic referral recommended. 2. Age indeterminate compression fracture through the L1 vertebral body with approximately 20-30% of central height loss and minimal 1-2 mm of bony retropulsion. Correlation with physical exam and site of pain recommended. 3. Bilateral pleural effusions, left  greater than right, with diffuse anasarca. 4. Stones and/or sludge within the gallbladder lumen. No CT evidence for acute cholecystitis. 5. Bilateral renal cysts as above. No nephrolithiasis or obstructive uropathy.   Electronically Signed   By: Jeannine Boga M.D.   On: 04/17/2015 01:07     Assessment/Plan Principal Problem:   Renal failure (ARF), acute on chronic Active Problems:   Hypothyroidism   Hyperlipidemia   Hypotension   Chronic anemia   Low back pain   1. Hypotension and hypothermia - concerning for infectious cause. UA does not show any definite signs of infection. I have ordered chest x-ray and blood cultures. A chest x-ray shows any definite infiltrates I will place patient on antibiotics. Check pro-calcitonin levels and recheck lactic acid levels. Patient has already received fluid bolus and blood pressure has improved but given patient's bilateral pleural effusions seen in CT renal study I will hold off further fluids for now. 2. Acute on chronic kidney disease - patient was just recently restarted on HCTZ. Patient's ARB was withheld by patient's nephrologist due to worsening renal function. Patient did receive normal skin bolus in the ER and since patient has pleural effusions am holding off further fluids. Patient was placed on cyclophosphamide per notes previously. May consult nephrologist in a.m. for further recommendations. 3. Low back pain with L1 compression fracture age indeterminant - patient cannot get MRI since patient has pacemaker placed for torsade. Get physical therapy consult. May need orthopedic consult if pain persists. 4. Pelvic mass concerning for ovarian tumor - Dr. Kennon Rounds on call gynecologist was consulted by the ER physician. Dr. Kennon Rounds as advised to call them again in a.m. for formal consult. Get pelvic ultrasound. 5. Metabolic acidosis from chronic renal failure - follow metabolic panel. 6. Chronic anemia requiring transfusion last month - follow  CBC. 7. History of torsades status post AICD placement.  Addendum - Patient's chest  x-ray does show infiltrates for which I have placed patient on vancomycin and Azactam to cover for healthcare associated pneumonia.   DVT Prophylaxis Lovenox.  Code Status: Full code.  Family Communication: [name].  Disposition Plan: Admit to inpatient.    KAKRAKANDY,ARSHAD N. [facility] Pager [phone].  If 7PM-7AM, please contact night-coverage [email or web address] Password [facility] 04/17/2015, 3:36 AM

## 2015-04-17 NOTE — Evaluation (Signed)
Physical Therapy Evaluation Patient Details Name: Erika Rivera MRN: [id number] DOB: October 15, 1935 Today's Date: 04/17/2015   History of Present Illness  Erika Rivera is a 79 y.o. female adm  with  low back pain, has L1 comp fx; PMHx: chronic kidney disease and lupus nephritis, chronic anemia, history of AICD placement, dementia  Clinical Impression  Pt admitted with above diagnosis. Pt currently with functional limitations due to the deficits listed below (see PT Problem List).  Pt will benefit from skilled PT to increase their independence and safety with mobility to allow discharge to the venue listed below.  Pt will benefit from HHPT (was getting HH prior to this adm), family wants her to use RW for safety, agree with them at this point if pt will use it.     Follow Up Recommendations Home health PT    Equipment Recommendations  Rolling walker with 5" wheels (pt and family also asking about tub bench)    Recommendations for Other Services       Precautions / Restrictions Precautions Precautions: Fall      Mobility  Bed Mobility Overal bed mobility: Needs Assistance Bed Mobility: Supine to Sit     Supine to sit: Mod assist     General bed mobility comments: cues for technique, assist for trunk, LEs and bed pad used to assist scooting  Transfers Overall transfer level: Needs assistance Equipment used: Rolling walker (2 wheeled) Transfers: Sit to/from Stand Sit to Stand: Min assist         General transfer comment: pt impulsive, cues for safety and hand placement  Ambulation/Gait Ambulation/Gait assistance: Min assist Ambulation Distance (Feet): 5 Feet Assistive device: Rolling walker (2 wheeled) Gait Pattern/deviations: Step-through pattern;Decreased stride length     General Gait Details: cues for RW safety   Stairs            Wheelchair Mobility    Modified Rankin (Stroke Patients Only)       Balance                                              Pertinent Vitals/Pain Pain Assessment: No/denies pain    Home Living Family/patient expects to be discharged to:: Private residence Living Arrangements: Children Available Help at Discharge: Available 24 hours/day         Home Layout: One level Home Equipment: Hughes - single point Additional Comments: pt and family state they are waiting for a tub bench    Prior Function Level of Independence: Needs assistance   Gait / Transfers Assistance Needed: requiring more assist lately with transfers and amb per dtrs; has been sitting "a lot" and not wanting to get up           Hand Dominance        Extremity/Trunk Assessment   Upper Extremity Assessment: Generalized weakness           Lower Extremity Assessment: Generalized weakness         Communication   Communication: No difficulties  Cognition Arousal/Alertness: Awake/alert Behavior During Therapy: Impulsive Overall Cognitive Status: History of cognitive impairments - at baseline                      General Comments      Exercises        Assessment/Plan    PT Assessment Patient  needs continued PT services  PT Diagnosis Difficulty walking   PT Problem List Decreased strength;Decreased activity tolerance;Decreased mobility;Decreased balance;Decreased safety awareness  PT Treatment Interventions DME instruction;Gait training;Functional mobility training;Therapeutic activities;Therapeutic exercise;Patient/family education   PT Goals (Current goals can be found in the Care Plan section) Acute Rehab PT Goals Patient Stated Goal: home, stronger PT Goal Formulation: With patient/family Time For Goal Achievement: 05/01/15 Potential to Achieve Goals: Good    Frequency Min 3X/week   Barriers to discharge        Co-evaluation               End of Session Equipment Utilized During Treatment: Gait belt Activity Tolerance: Patient tolerated treatment well Patient  left: in chair;with call bell/phone within reach;with family/visitor present Nurse Communication: Mobility status         Time: 1217-1237 PT Time Calculation (min) (ACUTE ONLY): 20 min   Charges:   PT Evaluation $Initial PT Evaluation Tier I: 1 Procedure     PT G CodesKenyon Ana April 27, 2015, 12:50 PM

## 2015-04-17 NOTE — Progress Notes (Addendum)
ANTIBIOTIC CONSULT NOTE - INITIAL  Pharmacy Consult for Vancomycin/Aztreonam Indication: HCAP  Allergies  Allergen Reactions  . Diphenhydramine Hcl Anaphylaxis    Tongue swelling  . Penicillins Rash    Patient Measurements: Height: 5\' 5"  (165.1 cm) Weight: 170 lb 3.1 oz (77.2 kg) IBW/kg (Calculated) : 57 Adjusted Body Weight:   Vital Signs: Temp: 96.5 F (35.8 C) (07/13 0251) Temp Source: Rectal (07/13 0251) BP: 93/59 mmHg (07/13 0251) Pulse Rate: 67 (07/13 0251) Intake/Output from previous day:   Intake/Output from this shift:    Labs:  Recent Labs  04/16/15 2346  WBC 2.5*  HGB 8.7*  PLT 203  CREATININE 3.74*   Estimated Creatinine Clearance: 13.2 mL/min (by C-G formula based on Cr of 3.74). No results for input(s): VANCOTROUGH, VANCOPEAK, VANCORANDOM, GENTTROUGH, GENTPEAK, GENTRANDOM, TOBRATROUGH, TOBRAPEAK, TOBRARND, AMIKACINPEAK, AMIKACINTROU, AMIKACIN in the last 72 hours.   Microbiology: No results found for this or any previous visit (from the past 720 hour(s)).  Medical History: Past Medical History  Diagnosis Date  . ARTHRITIS, HAND   . ARTHRITIS, LEFT SHOULDER   . HEARING LOSS   . SMOKER   . CEREBROVASCULAR ACCIDENT, HX OF   . HYPERTENSION   . HYPERLIPIDEMIA   . HYPOTHYROIDISM   . OVERACTIVE BLADDER   . Cerebral aneurysm     s/p clipping  . Polymorphic ventricular tachycardia     s/p St Jude AICD (dual chamber) 12/2011;  Echo: mild LVH, EF 50-555, grade 1 diast dysfxn, trivial AI.   Marland Kitchen Torsade de pointes     PVC dependent  . Syncope     2/2 VTach  . Bradycardia     A paced by AICD  . Essential tremor   . Osteopenia 11/18/2012    10/14/12 DEXA @ [facility]: -2.2  . Allergy   . Arthritis   . CAD (coronary artery disease)     LHC 12/2011: RCA 75% (neg FFR), EF 55-65% - med Tx  . Dementia   . Vitamin B 12 deficiency   . Hypothyroidism   . Chronic kidney disease, stage III (moderate)   . High blood pressure with chronic kidney disease   . Stroke      With rupture of intra-cranial aneurysm  . Polymorphic ventricular tachycardia     s/p ICD placement  . Proteinuria   . Lupus     Pt reports "lupus of skin"    Medications:  Anti-infectives    Start     Dose/Rate Route Frequency Ordered Stop   04/19/15 0600  vancomycin (VANCOCIN) IVPB 1000 mg/200 mL premix     1,000 mg 200 mL/hr over 60 Minutes Intravenous Every 48 hours 04/17/15 0621     04/17/15 0630  vancomycin (VANCOCIN) IVPB 1000 mg/200 mL premix     1,000 mg 200 mL/hr over 60 Minutes Intravenous  Once 04/17/15 7622       Assessment: MD wishes pharmacy to dose vancomycin/aztreonam for PNA due to infiltrates on CXR.  Patient with very poor renal function.  Goal of Therapy:  Vancomycin trough level 15-20 mcg/ml  Aztreonam dosed based on patient weight and renal function   Plan:  Measure antibiotic drug levels at steady state Follow up culture results Vancomycin 1gm iv q48hr  Aztreonam 1gm iv q8hr  Tyler Deis, Shea Stakes [place] 04/17/2015,6:22 AM

## 2015-04-17 NOTE — Progress Notes (Signed)
[facility]                                                                              Patient Demographics  Erika Rivera, is a 79 y.o. female, DOB - 01-23-1936, WEX:[id number]  Admit date - 04/16/2015   Admitting Physician Rise Patience, MD  Outpatient Primary MD for the patient is Gwendolyn Grant, MD  LOS - 0   Chief Complaint  Patient presents with  . Back Pain      HPI on 04/17/2015 by Dr. Gean Birchwood Erika Rivera is a 79 y.o. female with history of chronic kidney disease and lupus nephritis, chronic anemia, history of AICD placement for torsades who was admitted last month for anemia and has received transfusion was brought to the ER after patient was complaining of worsening low back pain. As per patient's daughter patient has had a fall 3 weeks ago. Since then patient has been progressive low back pain. In the ER patient had CT renal study which showed L1 compression fracture which was age indeterminant with pelvic mass concerning for ovarian tumor. Patient also is found to have low normal blood pressure with worsening renal function. Patient was given 1 L normal saline bolus following which patient blood pressure improved. On my exam patient is also found to have hypothermia with temperature around 72F.   Assessment & Plan   Patient admitted this morning by Dr. Gean Birchwood. Please see full H&P for details. Agree with current assessment and plan.  Severe sepsis secondary to HCAP -Upon admission, patient was hypothermic, temperature 96.3, and leukopenic with white count of 2.3 -Patient was mildly hypotensive however was successfully resuscitated with IV fluids -Chest x-ray: Possible infiltration in the left lung base -Blood cultures pending -UA: many bacteria, WBC 0-2, negative leukocytes, nitrites -Continue vancomycin and aztreonam -Will order strep pneumonia and legionella urine antigens  Acute on chronic kidney disease, stage 3/lupus  nephritis -Creatinine currently 3.74, baseline appears to be approximately 2 -Will continue to monitor BMP  Pelvic mass -Seen on CT renal study: 8.6 x 6.9 x 6.2 cm heterogenous mass within the mid pelvis, worrisome for ovarian malignancy -Abdominal and pelvic ultrasound pending -Will order CA-125 -Spoke with oncology, Dr. Cecille Po, who recommended waiting for the ultrasound results and possibly consulting Gyn-onc (as an outpatient)   Metabolic acidosis -Likely secondary to chronic renal failure, will continue to monitor   Lower back pain with L1 compression fracture -CT: Age-indeterminate compression fracture through the L1 vertebral body, proximal 20-30% of central height loss, 1-2 mm of bony retropulsion -Cannot obtain MRI due to AICD placement -PT consulted  Chronic Anemia -Baseline hemoglobin approximately 10, currently 9 (drop may be dilutional) -Continue iron supplementation -Monitor CBC  Dementia -Continue Aricept  History of torsades, status post AICD placement   Code Status: Full  Family Communication: Family at bedside  Disposition Plan: Admitted.   Time Spent in minutes   30 minutes  Procedures  None  Consults   Gynecology, Dr. Cecille Po, via phone  DVT Prophylaxis  Lovenox  Ashden Sonnenberg D.O. on 04/17/2015 at 11:01 AM  Between 7am to 7pm - Pager - [phone]  After 7pm go to [email or web address] - password [email or web address]  And look for the night coverage person covering for me after hours  [facility] Office  [phone]

## 2015-04-17 NOTE — ED Notes (Signed)
Patient transported to CT 

## 2015-04-17 NOTE — Telephone Encounter (Signed)
Note pt now admitted to hospital - defer mgmt of concerns to IP team thanks

## 2015-04-17 NOTE — Consult Note (Addendum)
Consultation Note Date: 04/17/2015   Patient Name: Erika Rivera  DOB: 12-24-1935  MRN: [id number]  Age / Sex: 79 y.o., female   PCP: Rowe Clack, MD Referring Physician: Cristal Ford, DO  Reason for Consultation: Establishing goals of care  Palliative Care Assessment and Plan Summary of Established Goals of Care and Medical Treatment Preferences   79 y.o. Female with a past medical history significant for chronic kidney disease, lupus nephritis, chronic anemia. Patient has a history of torsades she status post AICD placement. Shunt has history of recent falls and recent L1 compression fracture. Patient arrived to the hospital with low back pain. Patient was found to have hypothermia and hypotension. Patient was given 1 L normal saline bolus. Workup in the emergency department with a CT renal study revealed possible ovarian tumor as well as L1 compression fracture. Patient has been admitted with a working diagnosis of pneumonia. Patient has been started on vancomycin and Azactam.   Palliative consult has been placed for CODE STATUS goals of care discussions and symptom management. Patient is resting in bed. She is hard of hearing. She does verbalize some but does not give her own history. There are several family members present at the bedside including the patient's daughter [name] who is the patient's healthcare agent.   Briefly introduced palliative care services. Discussed supportive care. Discussed patient's acute medical conditions requiring hospitalization. At present, no limits on care or desire. Patient's family wishes to new current treatment. For family meeting later this week, following disease trajectory for the patient. Admitted with low blood pressures and hypothermia, diagnosed with pneumonia.    Palliative care service will continue to follow and help guide decision-making and continue conversations with regards to goals of care.  Contacts/Participants in  Discussion: Primary Decision Maker:   Daughter [name] at [phone] HCPOA: yes    Code Status/Advance Care Planning:  CODE STATUS is full code.  Symptom Management:   Patient on percocet, tramadol and IV Morphine.   Palliative Prophylaxis: Recommend addition of bowel and antiemetic regimen.  Additional Recommendations (Limitations, Scope, Preferences):  Today to follow patient's disease trajectory: Assess response to IV fluids and IV antibiotics, patient being treated for pneumonia.  Follow-up further GYN workup Psycho-social/Spiritual:   Support System: Stenciled family, good support system. Daughter is healthcare agent  Desire for further Chaplaincy support:no  Prognosis: Unable to determine  Discharge Planning:  Pending clinical course. Patient came from home.   Values: Full code for now, no limits on care desired as per discussions with daughter and other family members. Life limiting illness: Multiple comorbidities: Acute on chronic renal failure, cheerful recent functional decline, recurrent falls L1 compression fracture, radiologic evidence of pelvic mass concerning for ovarian malignancy diagnosed in this hospitalization.      Chief Complaint/History of Present Illness: Low back pain  Primary Diagnoses  Present on Admission:  . Renal failure (ARF), acute on chronic . Hypotension . Hypothyroidism . Hyperlipidemia . Chronic anemia . Low back pain  Palliative Review of Systems: Noted I have reviewed the medical record, interviewed the patient and family, and examined the patient. The following aspects are pertinent.  Past Medical History  Diagnosis Date  . ARTHRITIS, HAND   . ARTHRITIS, LEFT SHOULDER   . HEARING LOSS   . SMOKER   . CEREBROVASCULAR ACCIDENT, HX OF   . HYPERTENSION   . HYPERLIPIDEMIA   . HYPOTHYROIDISM   . OVERACTIVE BLADDER   . Cerebral aneurysm     s/p clipping  .  Polymorphic ventricular tachycardia     s/p [employer] Jude AICD  (dual chamber) 12/2011;  Echo: mild LVH, EF 50-555, grade 1 diast dysfxn, trivial AI.   Marland Kitchen Torsade de pointes     PVC dependent  . Syncope     2/2 VTach  . Bradycardia     A paced by AICD  . Essential tremor   . Osteopenia 11/18/2012    10/14/12 DEXA @ [facility]: -2.2  . Allergy   . Arthritis   . CAD (coronary artery disease)     LHC 12/2011: RCA 75% (neg FFR), EF 55-65% - med Tx  . Dementia   . Vitamin B 12 deficiency   . Hypothyroidism   . Chronic kidney disease, stage III (moderate)   . High blood pressure with chronic kidney disease   . Stroke     With rupture of intra-cranial aneurysm  . Polymorphic ventricular tachycardia     s/p ICD placement  . Proteinuria   . Lupus     Pt reports "lupus of skin"   History   Social History  . Marital Status: Widowed    Spouse Name: N/A  . Number of Children: 2  . Years of Education: 23   Social History Main Topics  . Smoking status: Former Smoker -- 0.60 packs/day    Quit date: 11/28/2011  . 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 Alcohol Use: No  . Drug Use: No  . Sexual Activity: Not on file   Other Topics Concern  . None   Social History Narrative   Family History  Problem Relation Age of Onset  . Gout Father   . Kidney disease Father     kidney failure  . Arthritis Other   . Hypertension Other   . Kidney disease Other   . Heart disease Other   . Thyroid disease Other   . Colon polyps Neg Hx   . Esophageal cancer Neg Hx   . Rectal cancer Neg Hx   . Stomach cancer Neg Hx    Scheduled Meds: . aspirin  81 mg Oral Daily  . aztreonam  1 g Intravenous Q8H  . cholecalciferol  4,000 Units Oral Daily  . donepezil  10 mg Oral QHS  . enoxaparin (LOVENOX) injection  30 mg Subcutaneous Q24H  . ferrous sulfate  325 mg Oral Daily  . levothyroxine  100 mcg Oral QAC breakfast  . simvastatin  20 mg Oral q1800  . sodium chloride  3 mL Intravenous Q12H  .  [START ON 04/19/2015] vancomycin  1,000 mg Intravenous Q48H   Continuous Infusions:  PRN Meds:.acetaminophen **OR** acetaminophen, morphine injection, ondansetron **OR** ondansetron (ZOFRAN) IV, tiZANidine, traMADol Medications Prior to Admission:  Prior to Admission medications   Medication Sig Start Date End Date Taking? Authorizing Provider  amLODipine (NORVASC) 10 MG tablet Take 1 tablet (10 mg total) by mouth daily. 12/26/14  Yes Rowe Clack, MD  aspirin 81 MG chewable tablet Chew 81 mg by mouth daily.   Yes Historical Provider, MD  Calcium Carbonate-Vitamin D (CALCIUM + D PO) Take 1 capsule by mouth daily.  12/12/12  Yes Historical Provider, MD  Cholecalciferol (VITAMIN D) 2000 UNITS CAPS Take 4,000 Units by mouth daily.   Yes Historical Provider, MD  ciprofloxacin (CIPRO) 250 MG tablet Take 1 tablet by mouth daily. For 7 days 04/10/15 04/17/15 Yes Historical Provider, MD  donepezil (ARICEPT) 10 MG  tablet Take 1 tablet (10 mg total) by mouth at bedtime. 12/10/14  Yes Rebecca S Tat, DO  ferrous sulfate 325 (65 FE) MG tablet Take 1 tablet (325 mg total) by mouth 2 (two) times daily with a meal. Patient taking differently: Take 325 mg by mouth daily.  04/10/15  Yes Rowe Clack, MD  levothyroxine (SYNTHROID, LEVOTHROID) 100 MCG tablet Take 1 tablet (100 mcg total) by mouth daily. 12/26/14  Yes Rowe Clack, MD  potassium chloride (KLOR-CON M10) 10 MEQ tablet Take 1 tablet by mouth on Mon, Wed and Friday. 12/26/14  Yes Rowe Clack, MD  propranolol ER (INDERAL LA) 160 MG SR capsule Take 1 capsule (160 mg total) by mouth daily. 12/26/14  Yes Rowe Clack, MD  simvastatin (ZOCOR) 20 MG tablet Take 1 tablet (20 mg total) by mouth daily at 6 PM. 12/26/14  Yes Rowe Clack, MD  tiZANidine (ZANAFLEX) 4 MG tablet Take 4 mg by mouth every 6 (six) hours as needed for muscle spasms.   Yes Historical Provider, MD  traMADol (ULTRAM) 50 MG tablet Take 50 mg by mouth every 6 (six)  hours as needed for moderate pain.   Yes Historical Provider, MD  triamcinolone cream (KENALOG) 0.1 % Apply 1 application topically daily. 03/01/15  Yes Rowe Clack, MD  Cholecalciferol (VITAMIN D3) 5000 UNITS TABS Take 5,000 Units by mouth daily. Patient not taking: Reported on 04/16/2015 09/22/12   Rowe Clack, MD  hydrochlorothiazide (MICROZIDE) 12.5 MG capsule Take 1 capsule (12.5 mg total) by mouth daily. Patient not taking: Reported on 04/16/2015 04/10/15   Rowe Clack, MD   Allergies  Allergen Reactions  . Diphenhydramine Hcl Anaphylaxis    Tongue swelling  . Penicillins Rash   CBC:    Component Value Date/Time   WBC 2.3* 04/17/2015 0841   HGB 9.0* 04/17/2015 0841   HCT 27.4* 04/17/2015 0841   PLT 200 04/17/2015 0841   MCV 93.8 04/17/2015 0841   NEUTROABS 1.2* 04/17/2015 0841   LYMPHSABS 0.6* 04/17/2015 0841   MONOABS 0.3 04/17/2015 0841   EOSABS 0.2 04/17/2015 0841   BASOSABS 0.0 04/17/2015 0841   Comprehensive Metabolic Panel:    Component Value Date/Time   NA 138 04/17/2015 0841   K 3.8 04/17/2015 0841   CL 115* 04/17/2015 0841   CO2 16* 04/17/2015 0841   BUN 34* 04/17/2015 0841   CREATININE 3.74* 04/17/2015 0841   GLUCOSE 78 04/17/2015 0841   CALCIUM 8.3* 04/17/2015 0841   AST 16 04/17/2015 0841   ALT 8* 04/17/2015 0841   ALKPHOS 76 04/17/2015 0841   BILITOT 0.7 04/17/2015 0841   PROT 5.8* 04/17/2015 0841   ALBUMIN 2.1* 04/17/2015 0841    Physical Exam: Vital Signs: BP 90/59 mmHg  Pulse 67  Temp(Src) 97.5 F (36.4 C) (Oral)  Resp 17  Ht 5\' 5"  (1.651 m)  Wt 77.2 kg (170 lb 3.1 oz)  BMI 28.32 kg/m2  SpO2 100% SpO2: SpO2: 100 % O2 Device: O2 Device: Not Delivered O2 Flow Rate:   Intake/output summary:  Intake/Output Summary (Last 24 hours) at 04/17/15 1010 Last data filed at 04/17/15 0847  Gross per 24 hour  Intake    440 ml  Output      0 ml  Net    440 ml   LBM: Last BM Date: 04/16/15 Baseline Weight: Weight: 77.2 kg (170  lb 3.1 oz) Most recent weight: Weight: 77.2 kg (170 lb 3.1 oz)  Exam Findings:  Elderly lady resting in bed, hard of hearing Appears in no acute distress Breath sounds clear anteriorly S1 and S2 Bilateral lower extremity edema Patient is awake and alert, does verbalize some.                       Palliative Performance Scale: 30% Additional Data Reviewed: Recent Labs     04/16/15  2346  04/17/15  0841  WBC  2.5*  2.3*  HGB  8.7*  9.0*  PLT  203  200  NA  138  138  BUN  36*  34*  CREATININE  3.74*  3.74*     Time In: 0900 Time Out: 0955 Time Total: 55 min Greater than 50%  of this time was spent counseling and coordinating care related to the above assessment and plan.  Signed by: Loistine Chance, MD [phone] Loistine Chance, MD  04/17/2015, 10:10 AM  Please contact Palliative Medicine Team phone at [phone] for questions and concerns.

## 2015-04-18 ENCOUNTER — Inpatient Hospital Stay (HOSPITAL_COMMUNITY): Payer: Commercial Managed Care - HMO

## 2015-04-18 ENCOUNTER — Telehealth: Payer: Self-pay | Admitting: Internal Medicine

## 2015-04-18 DIAGNOSIS — E785 Hyperlipidemia, unspecified: Secondary | ICD-10-CM

## 2015-04-18 DIAGNOSIS — I959 Hypotension, unspecified: Secondary | ICD-10-CM

## 2015-04-18 DIAGNOSIS — N839 Noninflammatory disorder of ovary, fallopian tube and broad ligament, unspecified: Secondary | ICD-10-CM

## 2015-04-18 DIAGNOSIS — E038 Other specified hypothyroidism: Secondary | ICD-10-CM

## 2015-04-18 DIAGNOSIS — D649 Anemia, unspecified: Secondary | ICD-10-CM

## 2015-04-18 DIAGNOSIS — J90 Pleural effusion, not elsewhere classified: Secondary | ICD-10-CM

## 2015-04-18 DIAGNOSIS — M545 Low back pain: Secondary | ICD-10-CM

## 2015-04-18 DIAGNOSIS — Z515 Encounter for palliative care: Secondary | ICD-10-CM

## 2015-04-18 LAB — BASIC METABOLIC PANEL
Anion gap: 7 (ref 5–15)
BUN: 35 mg/dL — ABNORMAL HIGH (ref 6–20)
CO2: 15 mmol/L — ABNORMAL LOW (ref 22–32)
Calcium: 7.9 mg/dL — ABNORMAL LOW (ref 8.9–10.3)
Chloride: 112 mmol/L — ABNORMAL HIGH (ref 101–111)
Creatinine, Ser: 3.58 mg/dL — ABNORMAL HIGH (ref 0.44–1.00)
GFR calc Af Amer: 13 mL/min — ABNORMAL LOW (ref >60)
GFR calc non Af Amer: 11 mL/min — ABNORMAL LOW (ref >60)
Glucose, Bld: 71 mg/dL (ref 65–99)
Potassium: 3.9 mmol/L (ref 3.5–5.1)
Sodium: 134 mmol/L — ABNORMAL LOW (ref 135–145)

## 2015-04-18 LAB — LEGIONELLA ANTIGEN, URINE

## 2015-04-18 LAB — CA 125: CA 125: 39.9 U/mL — ABNORMAL HIGH (ref 0.0–38.1)

## 2015-04-18 NOTE — Progress Notes (Signed)
Spoke with patient's daughter at the bedside.  Patient alert to self.  Discussed the finding of an ovarian mass on CT and [place] along with CA 125 results.  Images reviewed by Dr. Denman George as well.  Answered questions regarding potential surgery in the future.  Informed of outpatient consult with Dr. Denman George.  Contact information given for myself and Dr. Denman George.  [name] advised to call for any questions or concerns.

## 2015-04-18 NOTE — Progress Notes (Signed)
[facility]                                                                              Patient Demographics  Erika Rivera, is a 79 y.o. female, DOB - [birth year], KDX:[id number]  Admit date - 04/16/2015   Admitting Physician Rise Patience, MD  Outpatient Primary MD for the patient is Gwendolyn Grant, MD  LOS - 1   Chief Complaint  Patient presents with  . Back Pain      HPI on 04/17/2015 by Dr. Gean Birchwood Erika Rivera is a 79 y.o. female with history of chronic kidney disease and lupus nephritis, chronic anemia, history of AICD placement for torsades who was admitted last month for anemia and has received transfusion was brought to the ER after patient was complaining of worsening low back pain. As per patient's daughter patient has had a fall 3 weeks ago. Since then patient has been progressive low back pain. In the ER patient had CT renal study which showed L1 compression fracture which was age indeterminant with pelvic mass concerning for ovarian tumor. Patient also is found to have low normal blood pressure with worsening renal function. Patient was given 1 L normal saline bolus following which patient blood pressure improved. On my exam patient is also found to have hypothermia with temperature around 1F.   Assessment & Plan   Severe sepsis secondary to HCAP -Upon admission, patient was hypothermic, temperature 96.3, and leukopenic with white count of 2.3 -Patient was mildly hypotensive however was successfully resuscitated with IV fluids -Chest x-ray: Possible infiltration in the left lung base -Blood cultures pending -UA: many bacteria, WBC 0-2, negative leukocytes, nitrites -Continue vancomycin and aztreonam -Strep neumonia urine antigen negative -Urine Legionella antigen pending  Acute on chronic kidney disease, stage 3/lupus nephritis -Creatinine currently 3.58, baseline appears to be approximately 2 -Will continue to monitor BMP  Pelvic  mass -Seen on CT renal study: 8.6 x 6.9 x 6.2 cm heterogenous mass within the mid pelvis, worrisome for ovarian malignancy -Abdominal and pelvic ultrasound: 8.6 cm solid mass with central cystic areas in the pelvic cul-de-sac posterior to the uterus -CA-125: 39.9 -Spoke with oncology, Dr. Cecille Po, who recommended waiting for the ultrasound results and possibly consulting Gyn-onc (as an outpatient) -GynOnc consulted and appreciated  Metabolic acidosis -Likely secondary to chronic renal failure, will continue to monitor   Lower back pain with L1 compression fracture -CT: Age-indeterminate compression fracture through the L1 vertebral body, proximal 20-30% of central height loss, 1-2 mm of bony retropulsion -Cannot obtain MRI due to AICD placement -PT consulted and recommended Home health PT  Chronic Anemia -Baseline hemoglobin approximately 10, currently 9 (drop may be dilutional) -Continue iron supplementation -Monitor CBC  Dementia -Continue Aricept  History of torsades, status post AICD placement  Code Status: Full  Family Communication: Daughter at bedside  Disposition Plan: Admitted. Vital signs remain stable.  Will transfer to medical floor.   Time Spent in minutes 30 minutes  Procedures  None  Consults  Gynecology, Dr. Cecille Po, via phone GynOnc  DVT Prophylaxis Lovenox  Lab Results  Component Value Date   PLT 200 04/17/2015    Medications  Scheduled Meds: .  aspirin  81 mg Oral Daily  . aztreonam  1 g Intravenous Q8H  . cholecalciferol  4,000 Units Oral Daily  . donepezil  10 mg Oral QHS  . enoxaparin (LOVENOX) injection  30 mg Subcutaneous Q24H  . ferrous sulfate  325 mg Oral Daily  . levothyroxine  100 mcg Oral QAC breakfast  . simvastatin  20 mg Oral q1800  . sodium chloride  3 mL Intravenous Q12H  . [START ON 04/19/2015] vancomycin  1,000 mg Intravenous Q48H   Continuous Infusions:  PRN Meds:.acetaminophen **OR**  acetaminophen, morphine injection, ondansetron **OR** ondansetron (ZOFRAN) IV, tiZANidine, traMADol  Antibiotics    Anti-infectives    Start     Dose/Rate Route Frequency Ordered Stop   04/19/15 0600  vancomycin (VANCOCIN) IVPB 1000 mg/200 mL premix     1,000 mg 200 mL/hr over 60 Minutes Intravenous Every 48 hours 04/17/15 0621     04/17/15 0900  aztreonam (AZACTAM) 1 g in dextrose 5 % 50 mL IVPB     1 g 100 mL/hr over 30 Minutes Intravenous Every 8 hours 04/17/15 0814     04/17/15 0630  vancomycin (VANCOCIN) IVPB 1000 mg/200 mL premix     1,000 mg 200 mL/hr over 60 Minutes Intravenous  Once 04/17/15 0621 04/17/15 0928   04/17/15 0630  aztreonam (AZACTAM) 1 g in dextrose 5 % 50 mL IVPB  Status:  Discontinued     1 g 100 mL/hr over 30 Minutes Intravenous 3 times per day 04/17/15 0629 04/17/15 3557      Subjective:   Erika Rivera seen and examined today.  Patient denies chest pain, shortness of breath, abdominal pain, headache, nausea, vomiting, constipation.  Per daughter, patient has diarrhea occasionally .  Objective:   Filed Vitals:   04/18/15 0404 04/18/15 0600 04/18/15 0731 04/18/15 0800  BP: 100/60 113/55  101/58  Pulse: 69 70  70  Temp: 98.9 F (37.2 C)  98.9 F (37.2 C)   TempSrc: Oral  Oral   Resp: 14 13  17   Height:      Weight:      SpO2: 99% 100%  100%    Wt Readings from Last 3 Encounters:  04/17/15 77.2 kg (170 lb 3.1 oz)  04/10/15 73.596 kg (162 lb 4 oz)  03/29/15 74.844 kg (165 lb)     Intake/Output Summary (Last 24 hours) at 04/18/15 1030 Last data filed at 04/18/15 0226  Gross per 24 hour  Intake   1103 ml  Output      0 ml  Net   1103 ml    Exam  General: Well developed, well nourished, no distress  HEENT: NCAT, mucous membranes moist.   Cardiovascular: S1 S2 auscultated, no rubs, murmurs or gallops. Regular rate and rhythm.  Respiratory: Clear to auscultation bilaterally with equal chest rise  Abdomen: Soft, obese, nontender,  nondistended, + bowel sounds  Extremities: warm dry without cyanosis clubbing. +LE Edema B/L   Neuro: Awake and alert to self.  Has history of dementia.  Follows commands. Nonfocal  Data Review   Micro Results Recent Results (from the past 240 hour(s))  MRSA PCR Screening     Status: None   Collection Time: 04/17/15  5:58 AM  Result Value Ref Range Status   MRSA by PCR NEGATIVE NEGATIVE Final    Comment:        The GeneXpert MRSA Assay (FDA approved for NASAL specimens only), is one component of a comprehensive MRSA colonization surveillance program. It is  not intended to diagnose MRSA infection nor to guide or monitor treatment for MRSA infections.     Radiology Reports US Abdomen Complete  04/18/2015   CLINICAL DATA:  Abnormal gallbladder on prior CT.  EXAM: ULTRASOUND ABDOMEN COMPLETE  COMPARISON:  CT 04/17/2015.  FINDINGS: Gallbladder: Sludge and tiny gallstones noted. Gallbladder wall thickness 3.0 mm. Negative Murphy sign. No pericholecystic fluid collection.  Common bile duct: Diameter: 5.1 mm  Liver: No focal lesion identified. Within normal limits in parenchymal echogenicity.  IVC: No abnormality visualized.  Pancreas: Visualized portion unremarkable.  Spleen: Size and appearance within normal limits.  Right Kidney: Length: 8.9 cm. Cortical thinning . Echogenicity within normal limits. 3.4 cm and 1.5 cm simple cysts. No hydronephrosis visualized.  Left Kidney: Length: 7.9 cm. Cortical thinning . Echogenicity within normal limits. 3.7 cm simple cyst. No hydronephrosis visualized.  Abdominal aorta: No aneurysm visualized.  Other findings: None.  IMPRESSION: 1. Sludge and tiny gallstones. Gallbladder wall thickness 3.0 mm. Negative Murphy sign. No pericholecystic fluid collection. No biliary distention. 2. Bilateral renal cortical thinning.  Bilateral simple renal cysts.   Electronically Signed   By: Marcello Moores  Register   On: 04/18/2015 08:39   US Transvaginal Non-ob  04/18/2015    CLINICAL DATA:  Pelvic pain. Pelvic mass seen on recent CT. Postmenopausal female.  EXAM: TRANSABDOMINAL AND TRANSVAGINAL ULTRASOUND OF PELVIS  TECHNIQUE: Both transabdominal and transvaginal ultrasound examinations of the pelvis were performed. Transabdominal technique was performed for global imaging of the pelvis including uterus, ovaries, adnexal regions, and pelvic cul-de-sac. It was necessary to proceed with endovaginal exam following the transabdominal exam to visualize the adnexal mass and ovaries.  COMPARISON:  CT on 04/17/2015  FINDINGS: Uterus  Measurements: 5.4 x 3.2 x 4.1 cm. No fibroids or other mass visualized.  Endometrium  Thickness: 2 mm.  No focal abnormality visualized.  Right ovary  Measurements: Not directly visualized by transabdominal or transvaginal sonography.  Left ovary  Measurements: Not directly visualized by transabdominal or transvaginal sonography.  Other findings  A solid mass containing central cystic areas is seen in the pelvic cul-de-sac posterior to the uterus which measures 8.6 x 6.5 x 7.2 cm. This shows internal blood flow on color Doppler ultrasound. Small amount of Rivera fluid also noted.  IMPRESSION: 8.6 cm solid mass with central cystic areas in the pelvic cul-de-sac posterior to the uterus. Differential diagnosis includes ovarian neoplasm and pedunculated fibroid with cystic degeneration. Recommend pelvic MRI without and with contrast for further evaluation.  Small amount of Rivera fluid.   Electronically Signed   By: Earle Gell M.D.   On: 04/18/2015 08:45   US Pelvis Complete  04/18/2015   CLINICAL DATA:  Pelvic pain. Pelvic mass seen on recent CT. Postmenopausal female.  EXAM: TRANSABDOMINAL AND TRANSVAGINAL ULTRASOUND OF PELVIS  TECHNIQUE: Both transabdominal and transvaginal ultrasound examinations of the pelvis were performed. Transabdominal technique was performed for global imaging of the pelvis including uterus, ovaries, adnexal regions, and pelvic cul-de-sac. It  was necessary to proceed with endovaginal exam following the transabdominal exam to visualize the adnexal mass and ovaries.  COMPARISON:  CT on 04/17/2015  FINDINGS: Uterus  Measurements: 5.4 x 3.2 x 4.1 cm. No fibroids or other mass visualized.  Endometrium  Thickness: 2 mm.  No focal abnormality visualized.  Right ovary  Measurements: Not directly visualized by transabdominal or transvaginal sonography.  Left ovary  Measurements: Not directly visualized by transabdominal or transvaginal sonography.  Other findings  A solid mass containing central  cystic areas is seen in the pelvic cul-de-sac posterior to the uterus which measures 8.6 x 6.5 x 7.2 cm. This shows internal blood flow on color Doppler ultrasound. Small amount of Rivera fluid also noted.  IMPRESSION: 8.6 cm solid mass with central cystic areas in the pelvic cul-de-sac posterior to the uterus. Differential diagnosis includes ovarian neoplasm and pedunculated fibroid with cystic degeneration. Recommend pelvic MRI without and with contrast for further evaluation.  Small amount of Rivera fluid.   Electronically Signed   By: Earle Gell M.D.   On: 04/18/2015 08:45   Dg Chest Port 1 View  04/17/2015   CLINICAL DATA:  Hypotension.  EXAM: PORTABLE CHEST - 1 VIEW  COMPARISON:  12/08/2011  FINDINGS: Cardiac pacemaker. Shallow inspiration. Suggestion of infiltration in the left lung base which may indicate pneumonia. No blunting of costophrenic angles. No pneumothorax. Calcified and tortuous aorta.  IMPRESSION: Shallow inspiration.  Possible infiltration in the left lung base.   Electronically Signed   By: Lucienne Capers M.D.   On: 04/17/2015 03:43   Ct Renal Stone Study  04/17/2015   CLINICAL DATA:  Initial evaluation for lower back pain with right flank pain.  EXAM: CT ABDOMEN AND PELVIS WITHOUT CONTRAST  TECHNIQUE: Multidetector CT imaging of the abdomen and pelvis was performed following the standard protocol without IV contrast.  COMPARISON:  None.   FINDINGS: Small layering bilateral pleural effusions are present, left slightly larger than right. There is associated mild bibasilar atelectasis. Cardiac pacemaker electrodes partially visualized. No pericardial effusion.  Limited noncontrast evaluation of the liver is unremarkable. Hyperdensity within the gallbladder lumen most compatible with stones and/or sludge. No evidence for acute cholecystitis. No biliary dilatation. Spleen and adrenal glands are within normal limits. A few scattered dystrophic calcifications noted within the pancreatic body. Pancreas is otherwise unremarkable.  3.8 cm cyst present within the interpolar right kidney. Additional 1.6 cm cyst present within the upper pole of the right kidney. Additional subcentimeter hypodensity within the interpolar right kidney is too small the characterize by CT, but may reflect an additional small cyst. No evidence for right-sided nephrolithiasis or hydronephrosis. No radiopaque calculi seen along the course of the right renal collecting system. There is no right-sided hydroureter PICC  4 cm cyst present within the interpolar left kidney. There is an additional 1.5 cm cyst immediately posteriorly within the left kidney. No evidence for left-sided nephrolithiasis or hydronephrosis. Calcification at the left renal hilum favored to be vascular in nature. No radiopaque calculi seen along the course of the left renal collecting system. There is no left-sided hydroureter.  Small hiatal hernia noted. Stomach otherwise unremarkable. No evidence for bowel obstruction. Appendix within normal limits. No acute inflammatory changes seen about the bowels.  Bladder is largely decompressed but grossly unremarkable.  There is an ovoid mass that measures 8.6 x 6.9 x 6.2 cm centered in the mid pelvis (series 2, image 69). This lesion demonstrates internal heterogeneous attenuation with fluid and soft tissue density. There is a small amount of adjacent Rivera fluid within the  pelvis. Is unclear whether this lesion is ovarian in nature. This lies immediately posterior to the uterus, and appears separate from the uterus with a thin cleft of fluid dividing the two. Peripheral soft tissue about this lesion may reflect normal ovarian tissue flattened around the periphery of the lesion. There appears to be a normal left ovary laterally (series 2, image 68). Right ovary is not discretely identified. The distal colon lies immediately posterior to  this lesion.  No Rivera intraperitoneal air. No peritoneal studding or carcinomatosis. No pathologically enlarged lymph nodes. Advanced aorto bi-iliac atherosclerotic calcifications present. No aneurysm.  Diffuse anasarca noted within the external soft tissues.  Scoliosis with multilevel degenerative disc disease present. Thin sclerosis with slight height loss present through the superior aspect of the L1 vertebral body, consistent with an age-indeterminate compression fracture. This may be acute to subacute in nature. Minimal bony retropulsion of approximately 2 mm. Central height loss measures approximately 20-30%. No other acute osseous abnormality. No worrisome lytic or blastic osseous lesion.  IMPRESSION: 1. 8.6 x 6.9 x 6.2 cm heterogeneous mass centered within the mid pelvis with small amount of adjacent Rivera fluid. This lesion is indeterminate, but worrisome for possible primary ovarian malignancy. Further evaluation with pelvic ultrasound with gynecologic referral recommended. 2. Age indeterminate compression fracture through the L1 vertebral body with approximately 20-30% of central height loss and minimal 1-2 mm of bony retropulsion. Correlation with physical exam and site of pain recommended. 3. Bilateral pleural effusions, left greater than right, with diffuse anasarca. 4. Stones and/or sludge within the gallbladder lumen. No CT evidence for acute cholecystitis. 5. Bilateral renal cysts as above. No nephrolithiasis or obstructive uropathy.    Electronically Signed   By: Jeannine Boga M.D.   On: 04/17/2015 01:07    CBC  Recent Labs Lab 04/12/15 1358 04/12/15 1419 04/16/15 2346 04/17/15 0841  WBC  --   --  2.5* 2.3*  HGB 7.8* 10.2* 8.7* 9.0*  HCT  --   --  26.3* 27.4*  PLT  --   --  203 200  MCV  --   --  93.3 93.8  MCH  --   --  30.9 30.8  MCHC  --   --  33.1 32.8  RDW  --   --  22.3* 23.0*  LYMPHSABS  --   --  0.7 0.6*  MONOABS  --   --  0.4 0.3  EOSABS  --   --  0.2 0.2  BASOSABS  --   --  0.0 0.0    Chemistries   Recent Labs Lab 04/12/15 1417 04/16/15 2346 04/17/15 0841 04/18/15 0445  NA 139 138 138 134*  K 3.8 4.4 3.8 3.9  CL 116* 114* 115* 112*  CO2 16* 18* 16* 15*  GLUCOSE 78 69 78 71  BUN 29* 36* 34* 35*  CREATININE 3.59* 3.74* 3.74* 3.58*  CALCIUM 8.5* 8.3* 8.3* 7.9*  AST  --  15 16  --   ALT  --  8* 8*  --   ALKPHOS  --  69 76  --   BILITOT  --  0.8 0.7  --    ------------------------------------------------------------------------------------------------------------------ estimated creatinine clearance is 13.7 mL/min (by C-G formula based on Cr of 3.58). ------------------------------------------------------------------------------------------------------------------ No results for input(s): HGBA1C in the last 72 hours. ------------------------------------------------------------------------------------------------------------------ No results for input(s): CHOL, HDL, LDLCALC, TRIG, CHOLHDL, LDLDIRECT in the last 72 hours. ------------------------------------------------------------------------------------------------------------------  Recent Labs  04/17/15 0841  TSH 1.088   ------------------------------------------------------------------------------------------------------------------ No results for input(s): VITAMINB12, FOLATE, FERRITIN, TIBC, IRON, RETICCTPCT in the last 72 hours.  Coagulation profile No results for input(s): INR, PROTIME in the last 168 hours.  No  results for input(s): DDIMER in the last 72 hours.  Cardiac Enzymes No results for input(s): CKMB, TROPONINI, MYOGLOBIN in the last 168 hours.  Invalid input(s): CK ------------------------------------------------------------------------------------------------------------------ Invalid input(s): POCBNP    Spencer Peterkin D.O. on 04/18/2015 at 10:30 AM  Between 7am to 7pm -  Pager - [phone]  After 7pm go to [email or web address] - password [email or web address]  And look for the night coverage person covering for me after hours  [facility] Office  [phone]

## 2015-04-18 NOTE — Telephone Encounter (Signed)
[facility] called to verify patient was Dr. Katheren Puller pt and to let her know that patient is in the hospital

## 2015-04-18 NOTE — Progress Notes (Signed)
Daily Progress Note   Patient Name: Erika Rivera       Date: 04/18/2015 DOB: [birth year]  Age: 79 y.o. MRN#: [id number] Attending Physician: Cristal Ford, DO Primary Care Physician: Gwendolyn Grant, MD Admit Date: 04/16/2015  Reason for Consultation/Follow-up: Establishing goals of care  Subjective:  Patient is resting in bed. Appears weak. Patient has had decline in mobility since her recent falls and compression fracture. Prior to that, the patient is ambulating with assistance of cane/walker.  Interval Events: Patient has been seen and evaluated by GYN oncology. At this time, the plan is for outpatient follow-up within 2 weeks after discharge. At that time, decision is going to be made about next steps regarding this new diagnosis of a pelvic mass.  Goals of care discussion: [name] Erika Rivera who is her healthcare power of attorney agent is present at the bedside. She states that the patient underwent ICD placement 3 years ago. At that time she was required to complete advanced directives. Erika Rivera does have living will and healthcare power of attorney agent paperwork for available for my review. Patient's living will states that she would not want a feeding tube and that she would not want a breathing tube. she does have an ICD.  Cussed patient's multiple acute and chronic comorbidities: Dementia, recent falls, compression fracture, back pain, new pelvic mass. Discussed goals of care. Is off her current mode of care to continue. Patient to be discharged back home when deemed medically stable. Patient and daughter will follow up with GYN oncology. Continue Percocet when necessary for pain. Patient has an allergic reaction to Benadryl in the past. Shunt is still relatively opioid nave, would not consider fentanyl patch yet.  Cussed with the patient's daughter to monitor the patient's trajectory after this hospitalization. Discussed that if the patient is not deemed an  appropriate candidate for further therapies with regards to her pelvic mass, at that time, it may be appropriate to add hospice services. Gave some information about hospice today. All questions answered to the best of my ability. Length of Stay: 1 day  Current Medications: Scheduled Meds:  . aspirin  81 mg Oral Daily  . aztreonam  1 g Intravenous Q8H  . cholecalciferol  4,000 Units Oral Daily  . donepezil  10 mg Oral QHS  . enoxaparin (LOVENOX) injection  30 mg Subcutaneous Q24H  . ferrous sulfate  325 mg Oral Daily  . levothyroxine  100 mcg Oral QAC breakfast  . simvastatin  20 mg Oral q1800  . sodium chloride  3 mL Intravenous Q12H  . [START ON 04/19/2015] vancomycin  1,000 mg Intravenous Q48H    Continuous Infusions:    PRN Meds: acetaminophen **OR** acetaminophen, morphine injection, ondansetron **OR** ondansetron (ZOFRAN) IV, tiZANidine, traMADol  Palliative Performance Scale: 30 %     Vital Signs: BP 127/50 mmHg  Pulse 67  Temp(Src) 98.8 F (37.1 C) (Oral)  Resp 13  Ht 5\' 5"  (1.651 m)  Wt 77.2 kg (170 lb 3.1 oz)  BMI 28.32 kg/m2  SpO2 98% SpO2: SpO2: 98 % O2 Device: O2 Device: Not Delivered O2 Flow Rate:    Intake/output summary:  Intake/Output Summary (Last 24 hours) at 04/18/15 1615 Last data filed at 04/18/15 1053  Gross per 24 hour  Intake   1413 ml  Output      0 ml  Net   1413 ml   LBM:   Baseline Weight: Weight: 77.2 kg (170 lb 3.1 oz) Most recent weight: Weight: 77.2  kg (170 lb 3.1 oz)  Physical Exam: Elderly lady, hard of hearing, history of dementia, resting in bed. Occasionally moans because of back discomfort Lungs clear in the anterior Oriented S1-S2 Abdomen soft Extremities with trace edema   Additional Data Reviewed: Recent Labs     04/16/15  2346  04/17/15  0841  04/18/15  0445  WBC  2.5*  2.3*   --   HGB  8.7*  9.0*   --   PLT  203  200   --   NA  138  138  134*  BUN  36*  34*  35*  CREATININE  3.74*  3.74*  3.58*      Problem List:  Patient Active Problem List   Diagnosis Date Noted  . ARF (acute renal failure) 04/17/2015  . Renal failure (ARF), acute on chronic 04/17/2015  . Hypotension 04/17/2015  . Chronic anemia 04/17/2015  . Low back pain 04/17/2015  . Arterial hypotension   . Pelvic mass   . Bilateral pleural effusion   . Ovarian mass   . Encounter for palliative care   . Symptomatic anemia 03/22/2015  . Anemia 03/22/2015  . Lower back pain 01/25/2014  . Syncope 12/27/2013  . Sinus bradycardia 12/27/2013  . Vitamin B12 deficiency 12/08/2013  . CKD (chronic kidney disease) stage 3, GFR 30-59 ml/min 09/23/2013  . Proteinuria 09/23/2013  . Osteopenia 11/18/2012  . Dementia 07/22/2012  . CAD 01/07/2012  . Polymorphic ventricular tachycardia 12/05/2011  . Disturbance of skin sensation 09/01/2011  . OTHER PSORIASIS AND SIMILAR DISORDERS 02/25/2010  . Hyperlipidemia 08/26/2009  . Hypothyroidism 08/05/2009  . Essential hypertension 08/05/2009  . ARTHRITIS, LEFT SHOULDER 08/05/2009  . TREMOR 05/01/2008  . ARTHRITIS, HAND 01/20/2008  . HEARING LOSS 04/19/2007  . OVERACTIVE BLADDER 07/30/2005     Palliative Care Assessment & Plan    Code Status:  Limited code  Goals of Care:  Continue current mode of care. Discharge when deemed medically stable. For outpatient GYN oncology follow-up.  Desire for further Chaplaincy support:no  3. Symptom Management:  Continue Percocet when necessary. Consider PO oxycodone without the Tylenol if the patient is requiring a lot of Percocet.  4. Palliative Prophylaxis:  Stool Softner: Yes  5. Prognosis: Unable to determine  5. Discharge Planning: Likely home with home health. Request social worker consult.   Care plan was discussed with patient, daughter, nurse practitioner from GYN oncology is also present for part of the encounter  Thank you for allowing the Palliative Medicine Team to assist in the care of this patient.   Time  In: 1400 Time Out: 1435 Total Time 35 Prolonged Time Billed  no     Greater than 50%  of this time was spent counseling and coordinating care related to the above assessment and plan.   Loistine Chance, MD  04/18/2015, 4:15 PM  [phone] Please contact Palliative Medicine Team phone at [phone] for questions and concerns.

## 2015-04-18 NOTE — Progress Notes (Signed)
Called report to [facility] and and all questions answered. Family at bedside and updated.

## 2015-04-19 ENCOUNTER — Inpatient Hospital Stay (HOSPITAL_COMMUNITY): Admission: RE | Admit: 2015-04-19 | Payer: Commercial Managed Care - HMO | Source: Ambulatory Visit

## 2015-04-19 ENCOUNTER — Telehealth: Payer: Self-pay

## 2015-04-19 LAB — CBC WITH DIFFERENTIAL/PLATELET
BASOS ABS: 0 10*3/uL (ref 0.0–0.1)
BASOS PCT: 1 % (ref 0–1)
Eosinophils Absolute: 0.2 10*3/uL (ref 0.0–0.7)
Eosinophils Relative: 8 % — ABNORMAL HIGH (ref 0–5)
HCT: 25.7 % — ABNORMAL LOW (ref 36.0–46.0)
Hemoglobin: 8.3 g/dL — ABNORMAL LOW (ref 12.0–15.0)
Lymphocytes Relative: 34 % (ref 12–46)
Lymphs Abs: 0.9 10*3/uL (ref 0.7–4.0)
MCH: 29.3 pg (ref 26.0–34.0)
MCHC: 32.3 g/dL (ref 30.0–36.0)
MCV: 90.8 fL (ref 78.0–100.0)
MONO ABS: 0.4 10*3/uL (ref 0.1–1.0)
MONOS PCT: 15 % — AB (ref 3–12)
NEUTROS ABS: 1 10*3/uL — AB (ref 1.7–7.7)
NEUTROS PCT: 42 % — AB (ref 43–77)
Platelets: 202 10*3/uL (ref 150–400)
RBC: 2.83 MIL/uL — ABNORMAL LOW (ref 3.87–5.11)
RDW: 22.6 % — AB (ref 11.5–15.5)
WBC: 2.5 10*3/uL — ABNORMAL LOW (ref 4.0–10.5)

## 2015-04-19 LAB — BASIC METABOLIC PANEL
Anion gap: 5 (ref 5–15)
BUN: 36 mg/dL — ABNORMAL HIGH (ref 6–20)
CALCIUM: 7.9 mg/dL — AB (ref 8.9–10.3)
CHLORIDE: 113 mmol/L — AB (ref 101–111)
CO2: 16 mmol/L — AB (ref 22–32)
Creatinine, Ser: 3.68 mg/dL — ABNORMAL HIGH (ref 0.44–1.00)
GFR calc Af Amer: 13 mL/min — ABNORMAL LOW (ref >60)
GFR calc non Af Amer: 11 mL/min — ABNORMAL LOW (ref >60)
Glucose, Bld: 75 mg/dL (ref 65–99)
POTASSIUM: 4.1 mmol/L (ref 3.5–5.1)
SODIUM: 134 mmol/L — AB (ref 135–145)

## 2015-04-19 LAB — LACTIC ACID, PLASMA: Lactic Acid, Venous: 0.6 mmol/L (ref 0.5–2.0)

## 2015-04-19 LAB — PROCALCITONIN: Procalcitonin: <0.1 ng/mL

## 2015-04-19 MED ORDER — LEVOFLOXACIN 500 MG PO TABS: 500.0000 mg | ORAL_TABLET | ORAL | Status: DC

## 2015-04-19 MED ORDER — OXYCODONE-ACETAMINOPHEN 5-325 MG PO TABS
1.0000 | ORAL_TABLET | ORAL | Status: DC | PRN
  Administered 2015-04-20 – 2015-04-21 (×3): 1 via ORAL
  Filled 2015-04-19 (×3): qty 1

## 2015-04-19 MED ORDER — LEVOFLOXACIN 500 MG PO TABS
500.0000 mg | ORAL_TABLET | ORAL | Status: DC
  Administered 2015-04-19: 500 mg via ORAL
  Filled 2015-04-19 (×2): qty 1

## 2015-04-19 MED ORDER — TRAMADOL HCL 50 MG PO TABS: 50.0000 mg | ORAL_TABLET | Freq: Four times a day (QID) | ORAL | Status: AC | PRN

## 2015-04-19 MED ORDER — EPOETIN ALFA 10000 UNIT/ML IJ SOLN
10000.0000 [IU] | Freq: Once | INTRAMUSCULAR | Status: AC
  Administered 2015-04-19: 10000 [IU] via SUBCUTANEOUS
  Filled 2015-04-19: qty 1

## 2015-04-19 NOTE — Progress Notes (Signed)
Daily Progress Note   Patient Name: Erika Rivera       Date: 04/19/2015 DOB: 01-27-1936  Age: 79 y.o. MRN#: [id number] Attending Physician: Cristal Ford, DO Primary Care Physician: Gwendolyn Grant, MD Admit Date: 04/16/2015  Reason for Consultation/Follow-up: Establishing goals of care  Subjective:  Patient is sitting in chair. Appears in no distress.  Interval Events: Patient has been seen by social work.consideration is being given to the patient's transition to SNF at least for short-term rehabilitation Goals of care discussion: [name] [name] , patient's granddaughter, patient's 2 other daughters present in the room. Family members asked a lot of questions about skilled nursing facility, rehabilitation attempt. They asked questions about going home with hospice as well.. All questions answered to the best of my ability. Discussed if the patient came home with hospice support even after her skilled nursing facility rehabilitation attempt, at that point, family should consider deactivation of ICD if the patient is on full hospice. Ultimately, family has decided to proceed with attempt for skilled nursing facility and rehabilitation. They do plan on following up with GYN oncology.  Length of Stay: 2 days  Current Medications: Scheduled Meds:  . aspirin  81 mg Oral Daily  . cholecalciferol  4,000 Units Oral Daily  . donepezil  10 mg Oral QHS  . enoxaparin (LOVENOX) injection  30 mg Subcutaneous Q24H  . ferrous sulfate  325 mg Oral Daily  . [START ON 04/20/2015] levofloxacin  500 mg Oral Q48H  . levothyroxine  100 mcg Oral QAC breakfast  . simvastatin  20 mg Oral q1800  . sodium chloride  3 mL Intravenous Q12H    Continuous Infusions:    PRN Meds: acetaminophen **OR** acetaminophen, morphine injection, ondansetron **OR** ondansetron (ZOFRAN) IV, tiZANidine, traMADol  Palliative Performance Scale: 30 %     Vital Signs: BP 105/45 mmHg  Pulse 70   Temp(Src) 97.3 F (36.3 C) (Oral)  Resp 19  Ht 5\' 5"  (1.651 m)  Wt 78.291 kg (172 lb 9.6 oz)  BMI 28.72 kg/m2  SpO2 99% SpO2: SpO2: 99 % O2 Device: O2 Device: Not Delivered O2 Flow Rate:    Intake/output summary:   Intake/Output Summary (Last 24 hours) at 04/19/15 1551 Last data filed at 04/19/15 0557  Gross per 24 hour  Intake    250 ml  Output      0 ml  Net    250 ml   LBM:   Baseline Weight: Weight: 77.2 kg (170 lb 3.1 oz) Most recent weight: Weight: 78.291 kg (172 lb 9.6 oz)  Physical Exam: Elderly lady, hard of hearing, history of dementia, resting in chair. Occasionally moans because of back discomfort Lungs clear in the anterior Oriented S1-S2 Abdomen soft Extremities with trace edema   Additional Data Reviewed: Recent Labs     04/17/15  0841  04/18/15  0445  04/19/15  0420  WBC  2.3*   --   2.5*  HGB  9.0*   --   8.3*  PLT  200   --   202  NA  138  134*  134*  BUN  34*  35*  36*  CREATININE  3.74*  3.58*  3.68*     Problem List:  Patient Active Problem List   Diagnosis Date Noted  . ARF (acute renal failure) 04/17/2015  . Renal failure (ARF), acute on chronic 04/17/2015  . Hypotension 04/17/2015  . Chronic anemia 04/17/2015  . Low back pain 04/17/2015  . Arterial hypotension   .  Pelvic mass   . Bilateral pleural effusion   . Ovarian mass   . Encounter for palliative care   . Symptomatic anemia 03/22/2015  . Anemia 03/22/2015  . Lower back pain 01/25/2014  . Syncope 12/27/2013  . Sinus bradycardia 12/27/2013  . Vitamin B12 deficiency 12/08/2013  . CKD (chronic kidney disease) stage 3, GFR 30-59 ml/min 09/23/2013  . Proteinuria 09/23/2013  . Osteopenia 11/18/2012  . Dementia 07/22/2012  . CAD 01/07/2012  . Polymorphic ventricular tachycardia 12/05/2011  . Disturbance of skin sensation 09/01/2011  . OTHER PSORIASIS AND SIMILAR DISORDERS 02/25/2010  . Hyperlipidemia 08/26/2009  . Hypothyroidism 08/05/2009  . Essential hypertension  08/05/2009  . ARTHRITIS, LEFT SHOULDER 08/05/2009  . TREMOR 05/01/2008  . ARTHRITIS, HAND 01/20/2008  . HEARING LOSS 04/19/2007  . OVERACTIVE BLADDER 07/30/2005     Palliative Care Assessment & Plan    Code Status:  Limited code  Goals of Care:  Continue current mode of care. Discharge when deemed medically stable. For outpatient GYN oncology follow-up.  Desire for further Chaplaincy support:no  3. Symptom Management:  Continue Percocet when necessary. Consider PO oxycodone without the Tylenol if the patient is requiring a lot of Percocet.  4. Palliative Prophylaxis:  Stool Softner: Yes  5. Prognosis: Likely weeks-months  5. Discharge Planning: Likely SNF for rehabilitation   Care plan was discussed with patient, daughters and granddaughter present in the room  Thank you for allowing the Palliative Medicine Team to assist in the care of this patient.   Time In: 1400 Time Out: 1435 Total Time 35 Prolonged Time Billed  no     Greater than 50%  of this time was spent counseling and coordinating care related to the above assessment and plan.   Loistine Chance, MD  04/19/2015, 3:51 PM  [phone] Please contact Palliative Medicine Team phone at [phone] for questions and concerns.

## 2015-04-19 NOTE — Progress Notes (Signed)
CSW continuing to follow for disposition planning.  CSW has made multiple attempts to follow up with pt daughter via telephone for decision for SNF placement. Pt daughter not at bedside. CSW has been unable to reach pt daughter for decision for SNF. CSW left pt daughter a voice message to inform pt daughter that not all the facilities that provided bed offers will accept pt during the weekend and that per MD, pt plans to discharge tomorrow, so would be dependent on which facilities can accept pt tomorrow. CSW notified via voice message to pt daughter that pt could not remain in the hospital once pt discharged by MD if pt family was not satisfied with the facilities that were willing to accept pt then other option would be for pt to return home with home health services.  CSW spoke with pt insurance, [employer] who stated that weekend CSW can contact pt insurance company after hours/weekend phone number once pt family makes decision in order to obtain insurance authorization as insurance authorization cannot be given until decision for SNF made.  Weekend CSW to follow up with pt daughter regarding decision for SNF placement. Anticipation is for d/c 7/15.   Alison Murray, MSW, [place] Work [phone]

## 2015-04-19 NOTE — Care Management Important Message (Signed)
Important Message  Patient Details  Name: Erika Rivera MRN: [id number] Date of Birth: 10-10-1935   Medicare Important Message Given:  Yes-second notification given    Camillo Flaming 04/19/2015, 12:40 [place] Message  Patient Details  Name: Erika Rivera MRN: [id number] Date of Birth: 06-18-1936   Medicare Important Message Given:  Yes-second notification given    Camillo Flaming 04/19/2015, 12:40 PM

## 2015-04-19 NOTE — Telephone Encounter (Signed)
Received a skype message from [facility] to contact Dr. Ree Kida [phone]) attending during pt admission in the hospital.  Recap of conversation:   [facility] was dc'ed due to renal insufficiency.   New pelvic mass was found and has been referred to outpatient evaluation due to suspected tumor.   Decline in renal function for the last month.   Pt will be dc'ed to a SNF.  Dr. Ree Kida wanted to know if the pt had seen a nephrologist. The last item in the pt chart that I was able to locate was a nephropathology scan from 06/2014. Pt had a needle biopsy. Gave the final interpretation of that report to Dr. Ree Kida and confirmed belief that pt is and would be better attended to in SNF due to recent health decline.

## 2015-04-19 NOTE — Progress Notes (Signed)
Physical Therapy Treatment Patient Details Name: Erika Rivera MRN: [id number] DOB: Jan 11, 1936 Today's Date: 04/19/2015    History of Present Illness Erika Rivera is a 79 y.o. female adm  with  low back pain, has L1 comp fx; PMHx: chronic kidney disease and lupus nephritis, chronic anemia, history of AICD placement, dementia    PT Comments    Pt requires mod assist for supine to sit, pain limiting activity tolerance. She walked 58' with RW. ST-SNF recommended as family now feels they can't manage pt at home.   Follow Up Recommendations  SNF;Supervision/Assistance - 24 hour     Equipment Recommendations  Rolling walker with 5" wheels (pt and family also asking about tub bench)    Recommendations for Other Services       Precautions / Restrictions Precautions Precautions: Fall Restrictions Weight Bearing Restrictions: No    Mobility  Bed Mobility Overal bed mobility: Needs Assistance Bed Mobility: Supine to Sit     Supine to sit: Mod assist     General bed mobility comments: cues for technique, assist for trunk, LEs and bed pad used to assist scooting, increased time, pt limited by pain in LB  Transfers Overall transfer level: Needs assistance Equipment used: Rolling walker (2 wheeled) Transfers: Sit to/from Stand Sit to Stand: Min assist;From elevated surface         General transfer comment: raised bed, increased time, assist to rise  Ambulation/Gait Ambulation/Gait assistance: Min assist;Min guard Ambulation Distance (Feet): 12 Feet Assistive device: Rolling walker (2 wheeled) Gait Pattern/deviations: Decreased stride length;Decreased step length - right;Decreased step length - left;Trunk flexed Gait velocity: decr Gait velocity interpretation: Below normal speed for age/gender General Gait Details: cues for RW safety, min A balance   Stairs            Wheelchair Mobility    Modified Rankin (Stroke Patients Only)       Balance                                     Cognition Arousal/Alertness: Awake/alert Behavior During Therapy: WFL for tasks assessed/performed Overall Cognitive Status: History of cognitive impairments - at baseline                      Exercises      General Comments        Pertinent Vitals/Pain Pain Assessment: Faces Faces Pain Scale: Hurts whole lot Pain Location: low back with movement Pain Descriptors / Indicators: Sore Pain Intervention(s): Limited activity within patient's tolerance;Monitored during session;Premedicated before session    Home Living                      Prior Function            PT Goals (current goals can now be found in the care plan section) Acute Rehab PT Goals Patient Stated Goal: to get back to walking PT Goal Formulation: With patient/family Time For Goal Achievement: 05/01/15 Potential to Achieve Goals: Good Progress towards PT goals: Progressing toward goals    Frequency  Min 3X/week    PT Plan Current plan remains appropriate    Co-evaluation             End of Session Equipment Utilized During Treatment: Gait belt Activity Tolerance: Patient tolerated treatment well Patient left: in chair;with call bell/phone within reach;with family/visitor present  Time: 1015-1040 PT Time Calculation (min) (ACUTE ONLY): 25 min  Charges:  $Gait Training: 8-22 mins $Therapeutic Activity: 8-22 mins                    G Codes:      Philomena Doheny 04/19/2015, 10:46 AM [phone]

## 2015-04-19 NOTE — Clinical Social Work Note (Signed)
Clinical Social Work Assessment  Patient Details  Name: Erika Rivera MRN: [id number] Date of Birth: Sep 08, 1936  Date of referral:  04/19/15               Reason for consult:  Discharge Planning                Permission sought to share information with:  Family Supports Permission granted to share information::  Yes, Verbal Permission Granted  Name::     Erika Rivera  Agency::     Relationship::  daughter  Contact Information:  [phone]  Housing/Transportation Living arrangements for the past 2 months:  Apartment Source of Information:  Adult Children Patient Interpreter Needed:  None Criminal Activity/Legal Involvement Pertinent to Current Situation/Hospitalization:  No - Comment as needed Significant Relationships:  Adult Children Lives with:  Adult Children Do you feel safe going back to the place where you live?  No Need for family participation in patient care:  Yes (Comment) (pt with dementia)  Care giving concerns:  Pt lives at home with pt daughter. Pt daughter expressing concern about ability to care for pt at home and hopeful for rehab at [facility].    Social Worker assessment / plan:  CSW received referral for New SNF.   CSW visited pt room and spoke with pt daughter regarding plan. Pt has dementia and unable to appropriately discuss discharge plan. Pt daughter expressed that pt needs to be more mobile before returning home with her and MD anticipates d/c tomorrow. Pt daughter agreeable to [facility] search.  CSW completed FL2 and initiated SNF search to [facility].  CSW followed up with pt daughter at bedside to provide SNF bed offers. Pt daughter interested in [facility] and facility offered a bed. Pt daughter plans to visit facility in order to give CSW decision. CSW discussed with pt daughter that CSW needed decision for SNF today as if choice is [facility] then arrangements have to be made today for discharge over the weekend. Pt insurance also  requires authorization which cannot be processed until decision made.   CSW to follow up with pt daughter regarding decision for SNF in order to arrange pt disposition arrangements.   Employment status:  Retired Nurse, adult PT Recommendations:  [place] / Referral to community resources:  [place]  Patient/Family's Response to care:  Pt alert and oriented x 2. Pt family supportive, but pt daughter does not feel that pt needs can be managed at home. Pt daughter states that she has a friend that has been giving her some information about facilities to assist with decision.   Patient/Family's Understanding of and Emotional Response to Diagnosis, Current Treatment, and Prognosis:  Pt daughter displays understanding about pt diagnosis and treatment plan. Pt daughter aware that anticipation is for d/c tomorrow.   Emotional Assessment Appearance:  Appears stated age Attitude/Demeanor/Rapport:  Other (pt appropriate) Affect (typically observed):  Quiet (pt has dementia and pt daughter assisting with decisions) Orientation:  Oriented to Self, Oriented to Place Alcohol / Substance use:  Not Applicable Psych involvement (Current and /or in the community):  No (Comment)  Discharge Needs  Concerns to be addressed:  Discharge Planning Concerns Readmission within the last 30 days:  No Current discharge risk:  Physical Impairment Barriers to Discharge:  Continued Medical Work up   Alison Murray A, [place] 04/19/2015, 3:39 PM [phone]

## 2015-04-19 NOTE — Clinical Social Work Placement (Signed)
   CLINICAL SOCIAL WORK PLACEMENT  NOTE  Date:  04/19/2015  Patient Details  Name: Erika Rivera MRN: [id number] Date of Birth: Jun 08, 1936  Clinical Social Work is seeking post-discharge placement for this patient at the [place] level of care (*CSW will initial, date and re-position this form in  chart as items are completed):  Yes   Patient/family provided with [place] Work Department's list of facilities offering this level of care within the geographic area requested by the patient (or if unable, by the patient's family).  Yes   Patient/family informed of their freedom to choose among providers that offer the needed level of care, that participate in Medicare, Medicaid or managed care program needed by the patient, have an available bed and are willing to accept the patient.  Yes   Patient/family informed of [facility]'s ownership interest in [facility] and [facility], as well as of the fact that they are under no obligation to receive care at these facilities.  PASRR submitted to EDS on 04/19/15     PASRR number received on 04/19/15     Existing PASRR number confirmed on       FL2 transmitted to all facilities in geographic area requested by pt/family on 04/19/15     FL2 transmitted to all facilities within larger geographic area on       Patient informed that his/her managed care company has contracts with or will negotiate with certain facilities, including the following:        Yes   Patient/family informed of bed offers received.  Patient chooses bed at       Physician recommends and patient chooses bed at      Patient to be transferred to   on  .  Patient to be transferred to facility by       Patient family notified on   of transfer.  Name of family member notified:        PHYSICIAN Please sign FL2     Additional Comment:    _______________________________________________ Ladell Pier, LCSW 04/19/2015,  3:44 PM

## 2015-04-19 NOTE — Telephone Encounter (Signed)
Yes, i am PCP Yes, I am aware of admission  thanks

## 2015-04-19 NOTE — Progress Notes (Signed)
[facility]                                                                              Patient Demographics  Erika Rivera, is a 79 y.o. female, DOB - [birth year], OZD:[id number]  Admit date - 04/16/2015   Admitting Physician Rise Patience, MD  Outpatient Primary MD for the patient is Gwendolyn Grant, MD  LOS - 2   Chief Complaint  Patient presents with  . Back Pain      HPI on 04/17/2015 by Dr. Gean Birchwood Erika Rivera is a 79 y.o. female with history of chronic kidney disease and lupus nephritis, chronic anemia, history of AICD placement for torsades who was admitted last month for anemia and has received transfusion was brought to the ER after patient was complaining of worsening low back pain. As per patient's daughter patient has had a fall 3 weeks ago. Since then patient has been progressive low back pain. In the ER patient had CT renal study which showed L1 compression fracture which was age indeterminant with pelvic mass concerning for ovarian tumor. Patient also is found to have low normal blood pressure with worsening renal function. Patient was given 1 L normal saline bolus following which patient blood pressure improved. On my exam patient is also found to have hypothermia with temperature around 2F.   Assessment & Plan   Severe sepsis secondary to HCAP -Upon admission, patient was hypothermic, temperature 96.3, and leukopenic with white count of 2.3 -Patient was mildly hypotensive however was successfully resuscitated with IV fluids -Chest x-ray: Possible infiltration in the left lung base -Blood cultures show no growth to date -UA: many bacteria, WBC 0-2, negative leukocytes, nitrites -Continue vancomycin and aztreonam -Strep pneumonia and legionella urine antigens negative  Acute on chronic kidney disease, stage 4-5/lupus nephritis -Creatinine currently 3.68, baseline appears to be approximately 2 (however, has been elevated in the past  several months) -Will continue to monitor BMP -Spoke with PCP's office regarding worsening kidney function.  Per office, patient has been declining.   Pelvic mass -Seen on CT renal study: 8.6 x 6.9 x 6.2 cm heterogenous mass within the mid pelvis, worrisome for ovarian malignancy -Abdominal and pelvic ultrasound: 8.6 cm solid mass with central cystic areas in the pelvic cul-de-sac posterior to the uterus -CA-125: 39.9 -Spoke with oncology, Dr. Cecille Po, who recommended waiting for the ultrasound results and possibly consulting Gyn-onc (as an outpatient) -GynOnc consulted and appreciated, recommended outpatient follow up with Dr. Denman George  Metabolic acidosis -Likely secondary to chronic renal failure, will continue to monitor   Lower back pain with L1 compression fracture -CT: Age-indeterminate compression fracture through the L1 vertebral body, proximal 20-30% of central height loss, 1-2 mm of bony retropulsion -Cannot obtain MRI due to AICD placement -PT consulted and recommended SNF  Chronic Anemia -Baseline hemoglobin approximately 10, currently 8.3 (drop may be dilutional) -Continue iron supplementation; patient receives weekly epogen injections at [facility] -Monitor CBC -Will order Epogen 10K units  Dementia -Continue Aricept  History of torsades, status post AICD placement  Code Status: Full  Family Communication: Family at bedside  Disposition Plan: Admitted.  Will likely dc to SNF on 04/20/2015.  Time Spent in minutes 30 minutes  Procedures  None  Consults  Gynecology, Dr. Cecille Po, via phone GynOnc  DVT Prophylaxis Lovenox  Lab Results  Component Value Date   PLT 202 04/19/2015    Medications  Scheduled Meds: . aspirin  81 mg Oral Daily  . aztreonam  1 g Intravenous Q8H  . cholecalciferol  4,000 Units Oral Daily  . donepezil  10 mg Oral QHS  . enoxaparin (LOVENOX) injection  30 mg Subcutaneous Q24H  . ferrous sulfate  325 mg Oral  Daily  . levothyroxine  100 mcg Oral QAC breakfast  . simvastatin  20 mg Oral q1800  . sodium chloride  3 mL Intravenous Q12H  . vancomycin  1,000 mg Intravenous Q48H   Continuous Infusions:  PRN Meds:.acetaminophen **OR** acetaminophen, morphine injection, ondansetron **OR** ondansetron (ZOFRAN) IV, tiZANidine, traMADol  Antibiotics    Anti-infectives    Start     Dose/Rate Route Frequency Ordered Stop   04/19/15 0600  vancomycin (VANCOCIN) IVPB 1000 mg/200 mL premix     1,000 mg 200 mL/hr over 60 Minutes Intravenous Every 48 hours 04/17/15 0621     04/17/15 0900  aztreonam (AZACTAM) 1 g in dextrose 5 % 50 mL IVPB     1 g 100 mL/hr over 30 Minutes Intravenous Every 8 hours 04/17/15 0814     04/17/15 0630  vancomycin (VANCOCIN) IVPB 1000 mg/200 mL premix     1,000 mg 200 mL/hr over 60 Minutes Intravenous  Once 04/17/15 0621 04/17/15 0928   04/17/15 0630  aztreonam (AZACTAM) 1 g in dextrose 5 % 50 mL IVPB  Status:  Discontinued     1 g 100 mL/hr over 30 Minutes Intravenous 3 times per day 04/17/15 0629 04/17/15 3845      Subjective:   Erika Rivera seen and examined today.  Patient has no complaints today. Patient denies chest pain, shortness of breath, abdominal pain, headache, nausea, vomiting, constipation.  .  Objective:   Filed Vitals:   04/18/15 1758 04/18/15 2216 04/19/15 0623 04/19/15 1319  BP: 111/56 120/60 115/54 105/45  Pulse: 70 71 69 70  Temp: 98.2 F (36.8 C) 98.3 F (36.8 C) 98.4 F (36.9 C) 97.3 F (36.3 C)  TempSrc: Oral Oral Oral Oral  Resp: 16 20 20 19   Height:      Weight:   78.291 kg (172 lb 9.6 oz)   SpO2: 98% 98% 99% 99%    Wt Readings from Last 3 Encounters:  04/19/15 78.291 kg (172 lb 9.6 oz)  04/10/15 73.596 kg (162 lb 4 oz)  03/29/15 74.844 kg (165 lb)     Intake/Output Summary (Last 24 hours) at 04/19/15 1320 Last data filed at 04/19/15 0557  Gross per 24 hour  Intake    250 ml  Output      0 ml  Net    250 ml     Exam  General: Well developed, well nourished, no distress  HEENT: NCAT, mucous membranes moist.   Cardiovascular: S1 S2 auscultated, RRR  Respiratory: Clear to auscultation  Abdomen: Soft, obese, nontender, nondistended, + bowel sounds  Extremities: warm dry without cyanosis clubbing. +LE Edema B/L   Neuro: Awake and alert to self.  Has history of dementia.   Nonfocal  Data Review   Micro Results Recent Results (from the past 240 hour(s))  Culture, blood (routine x 2)     Status: None (Preliminary result)   Collection Time: 04/17/15  4:50 AM  Result Value Ref Range  Status   Specimen Description BLOOD LEFT HAND  Final   Special Requests BOTTLES DRAWN AEROBIC ONLY 2ML  Final   Culture   Final    NO GROWTH 1 DAY Performed at [facility]    Report Status PENDING  Incomplete  Culture, blood (routine x 2)     Status: None (Preliminary result)   Collection Time: 04/17/15  4:59 AM  Result Value Ref Range Status   Specimen Description BLOOD LEFT FOREARM  Final   Special Requests BOTTLES DRAWN AEROBIC AND ANAEROBIC 2ML  Final   Culture   Final    NO GROWTH 1 DAY Performed at [facility]    Report Status PENDING  Incomplete  MRSA PCR Screening     Status: None   Collection Time: 04/17/15  5:58 AM  Result Value Ref Range Status   MRSA by PCR NEGATIVE NEGATIVE Final    Comment:        The GeneXpert MRSA Assay (FDA approved for NASAL specimens only), is one component of a comprehensive MRSA colonization surveillance program. It is not intended to diagnose MRSA infection nor to guide or monitor treatment for MRSA infections.     Radiology Reports US Abdomen Complete  04/18/2015   CLINICAL DATA:  Abnormal gallbladder on prior CT.  EXAM: ULTRASOUND ABDOMEN COMPLETE  COMPARISON:  CT 04/17/2015.  FINDINGS: Gallbladder: Sludge and tiny gallstones noted. Gallbladder wall thickness 3.0 mm. Negative Murphy sign. No pericholecystic fluid collection.  Common  bile duct: Diameter: 5.1 mm  Liver: No focal lesion identified. Within normal limits in parenchymal echogenicity.  IVC: No abnormality visualized.  Pancreas: Visualized portion unremarkable.  Spleen: Size and appearance within normal limits.  Right Kidney: Length: 8.9 cm. Cortical thinning . Echogenicity within normal limits. 3.4 cm and 1.5 cm simple cysts. No hydronephrosis visualized.  Left Kidney: Length: 7.9 cm. Cortical thinning . Echogenicity within normal limits. 3.7 cm simple cyst. No hydronephrosis visualized.  Abdominal aorta: No aneurysm visualized.  Other findings: None.  IMPRESSION: 1. Sludge and tiny gallstones. Gallbladder wall thickness 3.0 mm. Negative Murphy sign. No pericholecystic fluid collection. No biliary distention. 2. Bilateral renal cortical thinning.  Bilateral simple renal cysts.   Electronically Signed   By: Marcello Moores  Register   On: 04/18/2015 08:39   US Transvaginal Non-ob  04/18/2015   CLINICAL DATA:  Pelvic pain. Pelvic mass seen on recent CT. Postmenopausal female.  EXAM: TRANSABDOMINAL AND TRANSVAGINAL ULTRASOUND OF PELVIS  TECHNIQUE: Both transabdominal and transvaginal ultrasound examinations of the pelvis were performed. Transabdominal technique was performed for global imaging of the pelvis including uterus, ovaries, adnexal regions, and pelvic cul-de-sac. It was necessary to proceed with endovaginal exam following the transabdominal exam to visualize the adnexal mass and ovaries.  COMPARISON:  CT on 04/17/2015  FINDINGS: Uterus  Measurements: 5.4 x 3.2 x 4.1 cm. No fibroids or other mass visualized.  Endometrium  Thickness: 2 mm.  No focal abnormality visualized.  Right ovary  Measurements: Not directly visualized by transabdominal or transvaginal sonography.  Left ovary  Measurements: Not directly visualized by transabdominal or transvaginal sonography.  Other findings  A solid mass containing central cystic areas is seen in the pelvic cul-de-sac posterior to the uterus  which measures 8.6 x 6.5 x 7.2 cm. This shows internal blood flow on color Doppler ultrasound. Small amount of free fluid also noted.  IMPRESSION: 8.6 cm solid mass with central cystic areas in the pelvic cul-de-sac posterior to the uterus. Differential diagnosis includes ovarian neoplasm  and pedunculated fibroid with cystic degeneration. Recommend pelvic MRI without and with contrast for further evaluation.  Small amount of free fluid.   Electronically Signed   By: Earle Gell M.D.   On: 04/18/2015 08:45   US Pelvis Complete  04/18/2015   CLINICAL DATA:  Pelvic pain. Pelvic mass seen on recent CT. Postmenopausal female.  EXAM: TRANSABDOMINAL AND TRANSVAGINAL ULTRASOUND OF PELVIS  TECHNIQUE: Both transabdominal and transvaginal ultrasound examinations of the pelvis were performed. Transabdominal technique was performed for global imaging of the pelvis including uterus, ovaries, adnexal regions, and pelvic cul-de-sac. It was necessary to proceed with endovaginal exam following the transabdominal exam to visualize the adnexal mass and ovaries.  COMPARISON:  CT on 04/17/2015  FINDINGS: Uterus  Measurements: 5.4 x 3.2 x 4.1 cm. No fibroids or other mass visualized.  Endometrium  Thickness: 2 mm.  No focal abnormality visualized.  Right ovary  Measurements: Not directly visualized by transabdominal or transvaginal sonography.  Left ovary  Measurements: Not directly visualized by transabdominal or transvaginal sonography.  Other findings  A solid mass containing central cystic areas is seen in the pelvic cul-de-sac posterior to the uterus which measures 8.6 x 6.5 x 7.2 cm. This shows internal blood flow on color Doppler ultrasound. Small amount of free fluid also noted.  IMPRESSION: 8.6 cm solid mass with central cystic areas in the pelvic cul-de-sac posterior to the uterus. Differential diagnosis includes ovarian neoplasm and pedunculated fibroid with cystic degeneration. Recommend pelvic MRI without and with contrast  for further evaluation.  Small amount of free fluid.   Electronically Signed   By: Earle Gell M.D.   On: 04/18/2015 08:45   Dg Chest Port 1 View  04/17/2015   CLINICAL DATA:  Hypotension.  EXAM: PORTABLE CHEST - 1 VIEW  COMPARISON:  12/08/2011  FINDINGS: Cardiac pacemaker. Shallow inspiration. Suggestion of infiltration in the left lung base which may indicate pneumonia. No blunting of costophrenic angles. No pneumothorax. Calcified and tortuous aorta.  IMPRESSION: Shallow inspiration.  Possible infiltration in the left lung base.   Electronically Signed   By: Lucienne Capers M.D.   On: 04/17/2015 03:43   Ct Renal Stone Study  04/17/2015   CLINICAL DATA:  Initial evaluation for lower back pain with right flank pain.  EXAM: CT ABDOMEN AND PELVIS WITHOUT CONTRAST  TECHNIQUE: Multidetector CT imaging of the abdomen and pelvis was performed following the standard protocol without IV contrast.  COMPARISON:  None.  FINDINGS: Small layering bilateral pleural effusions are present, left slightly larger than right. There is associated mild bibasilar atelectasis. Cardiac pacemaker electrodes partially visualized. No pericardial effusion.  Limited noncontrast evaluation of the liver is unremarkable. Hyperdensity within the gallbladder lumen most compatible with stones and/or sludge. No evidence for acute cholecystitis. No biliary dilatation. Spleen and adrenal glands are within normal limits. A few scattered dystrophic calcifications noted within the pancreatic body. Pancreas is otherwise unremarkable.  3.8 cm cyst present within the interpolar right kidney. Additional 1.6 cm cyst present within the upper pole of the right kidney. Additional subcentimeter hypodensity within the interpolar right kidney is too small the characterize by CT, but may reflect an additional small cyst. No evidence for right-sided nephrolithiasis or hydronephrosis. No radiopaque calculi seen along the course of the right renal collecting  system. There is no right-sided hydroureter PICC  4 cm cyst present within the interpolar left kidney. There is an additional 1.5 cm cyst immediately posteriorly within the left kidney. No evidence for left-sided nephrolithiasis  or hydronephrosis. Calcification at the left renal hilum favored to be vascular in nature. No radiopaque calculi seen along the course of the left renal collecting system. There is no left-sided hydroureter.  Small hiatal hernia noted. Stomach otherwise unremarkable. No evidence for bowel obstruction. Appendix within normal limits. No acute inflammatory changes seen about the bowels.  Bladder is largely decompressed but grossly unremarkable.  There is an ovoid mass that measures 8.6 x 6.9 x 6.2 cm centered in the mid pelvis (series 2, image 69). This lesion demonstrates internal heterogeneous attenuation with fluid and soft tissue density. There is a small amount of adjacent free fluid within the pelvis. Is unclear whether this lesion is ovarian in nature. This lies immediately posterior to the uterus, and appears separate from the uterus with a thin cleft of fluid dividing the two. Peripheral soft tissue about this lesion may reflect normal ovarian tissue flattened around the periphery of the lesion. There appears to be a normal left ovary laterally (series 2, image 68). Right ovary is not discretely identified. The distal colon lies immediately posterior to this lesion.  No free intraperitoneal air. No peritoneal studding or carcinomatosis. No pathologically enlarged lymph nodes. Advanced aorto bi-iliac atherosclerotic calcifications present. No aneurysm.  Diffuse anasarca noted within the external soft tissues.  Scoliosis with multilevel degenerative disc disease present. Thin sclerosis with slight height loss present through the superior aspect of the L1 vertebral body, consistent with an age-indeterminate compression fracture. This may be acute to subacute in nature. Minimal bony  retropulsion of approximately 2 mm. Central height loss measures approximately 20-30%. No other acute osseous abnormality. No worrisome lytic or blastic osseous lesion.  IMPRESSION: 1. 8.6 x 6.9 x 6.2 cm heterogeneous mass centered within the mid pelvis with small amount of adjacent free fluid. This lesion is indeterminate, but worrisome for possible primary ovarian malignancy. Further evaluation with pelvic ultrasound with gynecologic referral recommended. 2. Age indeterminate compression fracture through the L1 vertebral body with approximately 20-30% of central height loss and minimal 1-2 mm of bony retropulsion. Correlation with physical exam and site of pain recommended. 3. Bilateral pleural effusions, left greater than right, with diffuse anasarca. 4. Stones and/or sludge within the gallbladder lumen. No CT evidence for acute cholecystitis. 5. Bilateral renal cysts as above. No nephrolithiasis or obstructive uropathy.   Electronically Signed   By: Jeannine Boga M.D.   On: 04/17/2015 01:07    CBC  Recent Labs Lab 04/12/15 1358 04/12/15 1419 04/16/15 2346 04/17/15 0841 04/19/15 0420  WBC  --   --  2.5* 2.3* 2.5*  HGB 7.8* 10.2* 8.7* 9.0* 8.3*  HCT  --   --  26.3* 27.4* 25.7*  PLT  --   --  203 200 202  MCV  --   --  93.3 93.8 90.8  MCH  --   --  30.9 30.8 29.3  MCHC  --   --  33.1 32.8 32.3  RDW  --   --  22.3* 23.0* 22.6*  LYMPHSABS  --   --  0.7 0.6* 0.9  MONOABS  --   --  0.4 0.3 0.4  EOSABS  --   --  0.2 0.2 0.2  BASOSABS  --   --  0.0 0.0 0.0    Chemistries   Recent Labs Lab 04/12/15 1417 04/16/15 2346 04/17/15 0841 04/18/15 0445 04/19/15 0420  NA 139 138 138 134* 134*  K 3.8 4.4 3.8 3.9 4.1  CL 116* 114* 115* 112* 113*  CO2 16* 18* 16* 15* 16*  GLUCOSE 78 69 78 71 75  BUN 29* 36* 34* 35* 36*  CREATININE 3.59* 3.74* 3.74* 3.58* 3.68*  CALCIUM 8.5* 8.3* 8.3* 7.9* 7.9*  AST  --  15 16  --   --   ALT  --  8* 8*  --   --   ALKPHOS  --  69 76  --   --    BILITOT  --  0.8 0.7  --   --    ------------------------------------------------------------------------------------------------------------------ estimated creatinine clearance is 13.4 mL/min (by C-G formula based on Cr of 3.68). ------------------------------------------------------------------------------------------------------------------ No results for input(s): HGBA1C in the last 72 hours. ------------------------------------------------------------------------------------------------------------------ No results for input(s): CHOL, HDL, LDLCALC, TRIG, CHOLHDL, LDLDIRECT in the last 72 hours. ------------------------------------------------------------------------------------------------------------------  Recent Labs  04/17/15 0841  TSH 1.088   ------------------------------------------------------------------------------------------------------------------ No results for input(s): VITAMINB12, FOLATE, FERRITIN, TIBC, IRON, RETICCTPCT in the last 72 hours.  Coagulation profile No results for input(s): INR, PROTIME in the last 168 hours.  No results for input(s): DDIMER in the last 72 hours.  Cardiac Enzymes No results for input(s): CKMB, TROPONINI, MYOGLOBIN in the last 168 hours.  Invalid input(s): CK ------------------------------------------------------------------------------------------------------------------ Invalid input(s): POCBNP    Devanie Galanti D.O. on 04/19/2015 at 1:20 PM  Between 7am to 7pm - Pager - [phone]  After 7pm go to [email or web address] - password [email or web address]  And look for the night coverage person covering for me after hours  [facility] Office  [phone]

## 2015-04-20 LAB — CBC
HCT: 23.9 % — ABNORMAL LOW (ref 36.0–46.0)
HEMOGLOBIN: 7.8 g/dL — AB (ref 12.0–15.0)
MCH: 30 pg (ref 26.0–34.0)
MCHC: 32.6 g/dL (ref 30.0–36.0)
MCV: 91.9 fL (ref 78.0–100.0)
Platelets: 193 10*3/uL (ref 150–400)
RBC: 2.6 MIL/uL — ABNORMAL LOW (ref 3.87–5.11)
RDW: 22.6 % — ABNORMAL HIGH (ref 11.5–15.5)
WBC: 2.1 10*3/uL — ABNORMAL LOW (ref 4.0–10.5)

## 2015-04-20 LAB — BASIC METABOLIC PANEL
ANION GAP: 4 — AB (ref 5–15)
BUN: 35 mg/dL — ABNORMAL HIGH (ref 6–20)
CALCIUM: 7.7 mg/dL — AB (ref 8.9–10.3)
CO2: 17 mmol/L — ABNORMAL LOW (ref 22–32)
Chloride: 114 mmol/L — ABNORMAL HIGH (ref 101–111)
Creatinine, Ser: 3.42 mg/dL — ABNORMAL HIGH (ref 0.44–1.00)
GFR, EST AFRICAN AMERICAN: 14 mL/min — AB (ref >60)
GFR, EST NON AFRICAN AMERICAN: 12 mL/min — AB (ref >60)
Glucose, Bld: 62 mg/dL — ABNORMAL LOW (ref 65–99)
POTASSIUM: 4.1 mmol/L (ref 3.5–5.1)
SODIUM: 135 mmol/L (ref 135–145)

## 2015-04-20 LAB — GLUCOSE, CAPILLARY: GLUCOSE-CAPILLARY: 94 mg/dL (ref 65–99)

## 2015-04-20 LAB — OCCULT BLOOD X 1 CARD TO LAB, STOOL: Fecal Occult Bld: POSITIVE — AB

## 2015-04-20 NOTE — Telephone Encounter (Signed)
Concerns noted & update reviewed thanks

## 2015-04-20 NOTE — Clinical Social Work Note (Signed)
CSW met with pt's daughter Doroteo Bradford at bedside to discuss SNF choice.  Pt's daughter stated that her other sister, [name] is making decisions with regards to SNF.  CSW called and left a message for pt's other daughter [name] but did not receive a call  Back  CSW will continue to follow up with pt's family as pt may be ready for discharge tomorrow.  Dede Query, LCSW [place] Worker - Weekend Coverage cell #: [phone]

## 2015-04-20 NOTE — Progress Notes (Signed)
[facility]                                                                              Patient Demographics  Erika Rivera, is a 79 y.o. female, DOB - 05/06/1936, OIN:[id number]  Admit date - 04/16/2015   Admitting Physician Rise Patience, MD  Outpatient Primary MD for the patient is Gwendolyn Grant, MD  LOS - 3   Chief Complaint  Patient presents with  . Back Pain      HPI on 04/17/2015 by Dr. Gean Birchwood Erika Rivera is a 79 y.o. female with history of chronic kidney disease and lupus nephritis, chronic anemia, history of AICD placement for torsades who was admitted last month for anemia and has received transfusion was brought to the ER after patient was complaining of worsening low back pain. As per patient's daughter patient has had a fall 3 weeks ago. Since then patient has been progressive low back pain. In the ER patient had CT renal study which showed L1 compression fracture which was age indeterminant with pelvic mass concerning for ovarian tumor. Patient also is found to have low normal blood pressure with worsening renal function. Patient was given 1 L normal saline bolus following which patient blood pressure improved. On my exam patient is also found to have hypothermia with temperature around 68F.   Assessment & Plan   Severe sepsis secondary to HCAP -Upon admission, patient was hypothermic, temperature 96.3, and leukopenic with white count of 2.3 -Patient was mildly hypotensive however was successfully resuscitated with IV fluids -Chest x-ray: Possible infiltration in the left lung base -Blood cultures show no growth to date -UA: many bacteria, WBC 0-2, negative leukocytes, nitrites -Initially placed on  vancomycin and aztreonam, transitioned to Levaquin 500mg  Q48 hours (will need one more dose on 7/1/72016) -Strep pneumonia and legionella urine antigens negative  Acute on chronic kidney disease, stage 4-5/lupus nephritis -Creatinine  currently 3.42, baseline appears to be approximately 2 (however, has been elevated in the past several months) -Will continue to monitor BMP -Spoke with PCP's office regarding worsening kidney function.  Per office, patient has been declining.   Pelvic mass -Seen on CT renal study: 8.6 x 6.9 x 6.2 cm heterogenous mass within the mid pelvis, worrisome for ovarian malignancy -Abdominal and pelvic ultrasound: 8.6 cm solid mass with central cystic areas in the pelvic cul-de-sac posterior to the uterus -CA-125: 39.9 -Spoke with oncology, Dr. Cecille Po, who recommended waiting for the ultrasound results and possibly consulting Gyn-onc (as an outpatient) -GynOnc consulted and appreciated, recommended outpatient follow up with Dr. Denman George  Metabolic acidosis -Likely secondary to chronic renal failure, will continue to monitor   Lower back pain with L1 compression fracture -CT: Age-indeterminate compression fracture through the L1 vertebral body, proximal 20-30% of central height loss, 1-2 mm of bony retropulsion -Cannot obtain MRI due to AICD placement -PT consulted and recommended SNF  Chronic Anemia -Baseline hemoglobin approximately 10, currently 7.8 (possibly dilutional) -Continue iron supplementation; patient receives weekly epogen injections at [facility] -Monitor CBC -Given Epo 10K units on 04/19/2015  Dementia -Continue Aricept  History of torsades, status post AICD placement  Code Status: Full  Family Communication: Family  at bedside  Disposition Plan: Admitted.  Will likely dc to SNF when family makes a decision.   Time Spent in minutes 30 minutes  Procedures  None  Consults  Gynecology, Dr. Cecille Po, via phone GynOnc  DVT Prophylaxis Lovenox  Lab Results  Component Value Date   PLT 193 04/20/2015    Medications  Scheduled Meds: . aspirin  81 mg Oral Daily  . cholecalciferol  4,000 Units Oral Daily  . donepezil  10 mg Oral QHS  . enoxaparin  (LOVENOX) injection  30 mg Subcutaneous Q24H  . ferrous sulfate  325 mg Oral Daily  . levofloxacin  500 mg Oral Q48H  . levothyroxine  100 mcg Oral QAC breakfast  . simvastatin  20 mg Oral q1800  . sodium chloride  3 mL Intravenous Q12H   Continuous Infusions:  PRN Meds:.acetaminophen **OR** acetaminophen, morphine injection, ondansetron **OR** ondansetron (ZOFRAN) IV, oxyCODONE-acetaminophen, tiZANidine, traMADol  Antibiotics    Anti-infectives    Start     Dose/Rate Route Frequency Ordered Stop   04/20/15 0000  levofloxacin (LEVAQUIN) 500 MG tablet     500 mg Oral Every 48 hours 04/19/15 1520     04/19/15 1800  levofloxacin (LEVAQUIN) tablet 500 mg     500 mg Oral Every 48 hours 04/19/15 1519     04/19/15 0600  vancomycin (VANCOCIN) IVPB 1000 mg/200 mL premix  Status:  Discontinued     1,000 mg 200 mL/hr over 60 Minutes Intravenous Every 48 hours 04/17/15 0621 04/19/15 1519   04/17/15 0900  aztreonam (AZACTAM) 1 g in dextrose 5 % 50 mL IVPB  Status:  Discontinued     1 g 100 mL/hr over 30 Minutes Intravenous Every 8 hours 04/17/15 0814 04/19/15 1519   04/17/15 0630  vancomycin (VANCOCIN) IVPB 1000 mg/200 mL premix     1,000 mg 200 mL/hr over 60 Minutes Intravenous  Once 04/17/15 0621 04/17/15 0928   04/17/15 0630  aztreonam (AZACTAM) 1 g in dextrose 5 % 50 mL IVPB  Status:  Discontinued     1 g 100 mL/hr over 30 Minutes Intravenous 3 times per day 04/17/15 0629 04/17/15 6734      Subjective:   Erika Rivera seen and examined today.  Patient has no complaints today. Patient denies chest pain, shortness of breath, abdominal pain, headache, nausea, vomiting, constipation.  .  Objective:   Filed Vitals:   04/19/15 1937 04/19/15 1319 04/20/15 0540 04/20/15 1324  BP: 115/54 105/45 97/50 107/61  Pulse: 69 70 64 73  Temp: 98.4 F (36.9 C) 97.3 F (36.3 C) 97.2 F (36.2 C) 98.2 F (36.8 C)  TempSrc: Oral Oral Oral Oral  Resp: 20 19 16 18   Height:      Weight: 78.291 kg  (172 lb 9.6 oz)     SpO2: 99% 99% 99% 99%    Wt Readings from Last 3 Encounters:  04/19/15 78.291 kg (172 lb 9.6 oz)  04/10/15 73.596 kg (162 lb 4 oz)  03/29/15 74.844 kg (165 lb)     Intake/Output Summary (Last 24 hours) at 04/20/15 1343 Last data filed at 04/20/15 0958  Gross per 24 hour  Intake    590 ml  Output    100 ml  Net    490 ml    Exam  General: Well developed, well nourished, no distress  HEENT: NCAT, mucous membranes moist.   Cardiovascular: S1 S2 auscultated, RRR  Respiratory: Clear to auscultation  Extremities: warm dry without cyanosis clubbing. +LE Edema B/L  Neuro: Awake and alert to self.  Has history of dementia.   Nonfocal (generally weak, needed assistance getting out of bed to bedside commode)  Data Review   Micro Results Recent Results (from the past 240 hour(s))  Culture, blood (routine x 2)     Status: None (Preliminary result)   Collection Time: 04/17/15  4:50 AM  Result Value Ref Range Status   Specimen Description BLOOD LEFT HAND  Final   Special Requests BOTTLES DRAWN AEROBIC ONLY 2ML  Final   Culture   Final    NO GROWTH 3 DAYS Performed at [facility]    Report Status PENDING  Incomplete  Culture, blood (routine x 2)     Status: None (Preliminary result)   Collection Time: 04/17/15  4:59 AM  Result Value Ref Range Status   Specimen Description BLOOD LEFT FOREARM  Final   Special Requests BOTTLES DRAWN AEROBIC AND ANAEROBIC 2ML  Final   Culture   Final    NO GROWTH 3 DAYS Performed at [facility]    Report Status PENDING  Incomplete  MRSA PCR Screening     Status: None   Collection Time: 04/17/15  5:58 AM  Result Value Ref Range Status   MRSA by PCR NEGATIVE NEGATIVE Final    Comment:        The GeneXpert MRSA Assay (FDA approved for NASAL specimens only), is one component of a comprehensive MRSA colonization surveillance program. It is not intended to diagnose MRSA infection nor to guide or monitor  treatment for MRSA infections.     Radiology Reports US Abdomen Complete  04/18/2015   CLINICAL DATA:  Abnormal gallbladder on prior CT.  EXAM: ULTRASOUND ABDOMEN COMPLETE  COMPARISON:  CT 04/17/2015.  FINDINGS: Gallbladder: Sludge and tiny gallstones noted. Gallbladder wall thickness 3.0 mm. Negative Murphy sign. No pericholecystic fluid collection.  Common bile duct: Diameter: 5.1 mm  Liver: No focal lesion identified. Within normal limits in parenchymal echogenicity.  IVC: No abnormality visualized.  Pancreas: Visualized portion unremarkable.  Spleen: Size and appearance within normal limits.  Right Kidney: Length: 8.9 cm. Cortical thinning . Echogenicity within normal limits. 3.4 cm and 1.5 cm simple cysts. No hydronephrosis visualized.  Left Kidney: Length: 7.9 cm. Cortical thinning . Echogenicity within normal limits. 3.7 cm simple cyst. No hydronephrosis visualized.  Abdominal aorta: No aneurysm visualized.  Other findings: None.  IMPRESSION: 1. Sludge and tiny gallstones. Gallbladder wall thickness 3.0 mm. Negative Murphy sign. No pericholecystic fluid collection. No biliary distention. 2. Bilateral renal cortical thinning.  Bilateral simple renal cysts.   Electronically Signed   By: Marcello Moores  Register   On: 04/18/2015 08:39   US Transvaginal Non-ob  04/18/2015   CLINICAL DATA:  Pelvic pain. Pelvic mass seen on recent CT. Postmenopausal female.  EXAM: TRANSABDOMINAL AND TRANSVAGINAL ULTRASOUND OF PELVIS  TECHNIQUE: Both transabdominal and transvaginal ultrasound examinations of the pelvis were performed. Transabdominal technique was performed for global imaging of the pelvis including uterus, ovaries, adnexal regions, and pelvic cul-de-sac. It was necessary to proceed with endovaginal exam following the transabdominal exam to visualize the adnexal mass and ovaries.  COMPARISON:  CT on 04/17/2015  FINDINGS: Uterus  Measurements: 5.4 x 3.2 x 4.1 cm. No fibroids or other mass visualized.  Endometrium   Thickness: 2 mm.  No focal abnormality visualized.  Right ovary  Measurements: Not directly visualized by transabdominal or transvaginal sonography.  Left ovary  Measurements: Not directly visualized by transabdominal or transvaginal sonography.  Other  findings  A solid mass containing central cystic areas is seen in the pelvic cul-de-sac posterior to the uterus which measures 8.6 x 6.5 x 7.2 cm. This shows internal blood flow on color Doppler ultrasound. Small amount of free fluid also noted.  IMPRESSION: 8.6 cm solid mass with central cystic areas in the pelvic cul-de-sac posterior to the uterus. Differential diagnosis includes ovarian neoplasm and pedunculated fibroid with cystic degeneration. Recommend pelvic MRI without and with contrast for further evaluation.  Small amount of free fluid.   Electronically Signed   By: Earle Gell M.D.   On: 04/18/2015 08:45   US Pelvis Complete  04/18/2015   CLINICAL DATA:  Pelvic pain. Pelvic mass seen on recent CT. Postmenopausal female.  EXAM: TRANSABDOMINAL AND TRANSVAGINAL ULTRASOUND OF PELVIS  TECHNIQUE: Both transabdominal and transvaginal ultrasound examinations of the pelvis were performed. Transabdominal technique was performed for global imaging of the pelvis including uterus, ovaries, adnexal regions, and pelvic cul-de-sac. It was necessary to proceed with endovaginal exam following the transabdominal exam to visualize the adnexal mass and ovaries.  COMPARISON:  CT on 04/17/2015  FINDINGS: Uterus  Measurements: 5.4 x 3.2 x 4.1 cm. No fibroids or other mass visualized.  Endometrium  Thickness: 2 mm.  No focal abnormality visualized.  Right ovary  Measurements: Not directly visualized by transabdominal or transvaginal sonography.  Left ovary  Measurements: Not directly visualized by transabdominal or transvaginal sonography.  Other findings  A solid mass containing central cystic areas is seen in the pelvic cul-de-sac posterior to the uterus which measures 8.6 x  6.5 x 7.2 cm. This shows internal blood flow on color Doppler ultrasound. Small amount of free fluid also noted.  IMPRESSION: 8.6 cm solid mass with central cystic areas in the pelvic cul-de-sac posterior to the uterus. Differential diagnosis includes ovarian neoplasm and pedunculated fibroid with cystic degeneration. Recommend pelvic MRI without and with contrast for further evaluation.  Small amount of free fluid.   Electronically Signed   By: Earle Gell M.D.   On: 04/18/2015 08:45   Dg Chest Port 1 View  04/17/2015   CLINICAL DATA:  Hypotension.  EXAM: PORTABLE CHEST - 1 VIEW  COMPARISON:  12/08/2011  FINDINGS: Cardiac pacemaker. Shallow inspiration. Suggestion of infiltration in the left lung base which may indicate pneumonia. No blunting of costophrenic angles. No pneumothorax. Calcified and tortuous aorta.  IMPRESSION: Shallow inspiration.  Possible infiltration in the left lung base.   Electronically Signed   By: Lucienne Capers M.D.   On: 04/17/2015 03:43   Ct Renal Stone Study  04/17/2015   CLINICAL DATA:  Initial evaluation for lower back pain with right flank pain.  EXAM: CT ABDOMEN AND PELVIS WITHOUT CONTRAST  TECHNIQUE: Multidetector CT imaging of the abdomen and pelvis was performed following the standard protocol without IV contrast.  COMPARISON:  None.  FINDINGS: Small layering bilateral pleural effusions are present, left slightly larger than right. There is associated mild bibasilar atelectasis. Cardiac pacemaker electrodes partially visualized. No pericardial effusion.  Limited noncontrast evaluation of the liver is unremarkable. Hyperdensity within the gallbladder lumen most compatible with stones and/or sludge. No evidence for acute cholecystitis. No biliary dilatation. Spleen and adrenal glands are within normal limits. A few scattered dystrophic calcifications noted within the pancreatic body. Pancreas is otherwise unremarkable.  3.8 cm cyst present within the interpolar right kidney.  Additional 1.6 cm cyst present within the upper pole of the right kidney. Additional subcentimeter hypodensity within the interpolar right kidney is too  small the characterize by CT, but may reflect an additional small cyst. No evidence for right-sided nephrolithiasis or hydronephrosis. No radiopaque calculi seen along the course of the right renal collecting system. There is no right-sided hydroureter PICC  4 cm cyst present within the interpolar left kidney. There is an additional 1.5 cm cyst immediately posteriorly within the left kidney. No evidence for left-sided nephrolithiasis or hydronephrosis. Calcification at the left renal hilum favored to be vascular in nature. No radiopaque calculi seen along the course of the left renal collecting system. There is no left-sided hydroureter.  Small hiatal hernia noted. Stomach otherwise unremarkable. No evidence for bowel obstruction. Appendix within normal limits. No acute inflammatory changes seen about the bowels.  Bladder is largely decompressed but grossly unremarkable.  There is an ovoid mass that measures 8.6 x 6.9 x 6.2 cm centered in the mid pelvis (series 2, image 69). This lesion demonstrates internal heterogeneous attenuation with fluid and soft tissue density. There is a small amount of adjacent free fluid within the pelvis. Is unclear whether this lesion is ovarian in nature. This lies immediately posterior to the uterus, and appears separate from the uterus with a thin cleft of fluid dividing the two. Peripheral soft tissue about this lesion may reflect normal ovarian tissue flattened around the periphery of the lesion. There appears to be a normal left ovary laterally (series 2, image 68). Right ovary is not discretely identified. The distal colon lies immediately posterior to this lesion.  No free intraperitoneal air. No peritoneal studding or carcinomatosis. No pathologically enlarged lymph nodes. Advanced aorto bi-iliac atherosclerotic calcifications  present. No aneurysm.  Diffuse anasarca noted within the external soft tissues.  Scoliosis with multilevel degenerative disc disease present. Thin sclerosis with slight height loss present through the superior aspect of the L1 vertebral body, consistent with an age-indeterminate compression fracture. This may be acute to subacute in nature. Minimal bony retropulsion of approximately 2 mm. Central height loss measures approximately 20-30%. No other acute osseous abnormality. No worrisome lytic or blastic osseous lesion.  IMPRESSION: 1. 8.6 x 6.9 x 6.2 cm heterogeneous mass centered within the mid pelvis with small amount of adjacent free fluid. This lesion is indeterminate, but worrisome for possible primary ovarian malignancy. Further evaluation with pelvic ultrasound with gynecologic referral recommended. 2. Age indeterminate compression fracture through the L1 vertebral body with approximately 20-30% of central height loss and minimal 1-2 mm of bony retropulsion. Correlation with physical exam and site of pain recommended. 3. Bilateral pleural effusions, left greater than right, with diffuse anasarca. 4. Stones and/or sludge within the gallbladder lumen. No CT evidence for acute cholecystitis. 5. Bilateral renal cysts as above. No nephrolithiasis or obstructive uropathy.   Electronically Signed   By: Jeannine Boga M.D.   On: 04/17/2015 01:07    CBC  Recent Labs Lab 04/16/15 2346 04/17/15 0841 04/19/15 0420 04/20/15 0530  WBC 2.5* 2.3* 2.5* 2.1*  HGB 8.7* 9.0* 8.3* 7.8*  HCT 26.3* 27.4* 25.7* 23.9*  PLT 203 200 202 193  MCV 93.3 93.8 90.8 91.9  MCH 30.9 30.8 29.3 30.0  MCHC 33.1 32.8 32.3 32.6  RDW 22.3* 23.0* 22.6* 22.6*  LYMPHSABS 0.7 0.6* 0.9  --   MONOABS 0.4 0.3 0.4  --   EOSABS 0.2 0.2 0.2  --   BASOSABS 0.0 0.0 0.0  --     Chemistries   Recent Labs Lab 04/16/15 2346 04/17/15 0841 04/18/15 0445 04/19/15 0420 04/20/15 0530  NA 138 138 134* 134*  135  K 4.4 3.8 3.9 4.1  4.1  CL 114* 115* 112* 113* 114*  CO2 18* 16* 15* 16* 17*  GLUCOSE 69 78 71 75 62*  BUN 36* 34* 35* 36* 35*  CREATININE 3.74* 3.74* 3.58* 3.68* 3.42*  CALCIUM 8.3* 8.3* 7.9* 7.9* 7.7*  AST 15 16  --   --   --   ALT 8* 8*  --   --   --   ALKPHOS 69 76  --   --   --   BILITOT 0.8 0.7  --   --   --    ------------------------------------------------------------------------------------------------------------------ estimated creatinine clearance is 14.5 mL/min (by C-G formula based on Cr of 3.42). ------------------------------------------------------------------------------------------------------------------ No results for input(s): HGBA1C in the last 72 hours. ------------------------------------------------------------------------------------------------------------------ No results for input(s): CHOL, HDL, LDLCALC, TRIG, CHOLHDL, LDLDIRECT in the last 72 hours. ------------------------------------------------------------------------------------------------------------------ No results for input(s): TSH, T4TOTAL, T3FREE, THYROIDAB in the last 72 hours.  Invalid input(s): FREET3 ------------------------------------------------------------------------------------------------------------------ No results for input(s): VITAMINB12, FOLATE, FERRITIN, TIBC, IRON, RETICCTPCT in the last 72 hours.  Coagulation profile No results for input(s): INR, PROTIME in the last 168 hours.  No results for input(s): DDIMER in the last 72 hours.  Cardiac Enzymes No results for input(s): CKMB, TROPONINI, MYOGLOBIN in the last 168 hours.  Invalid input(s): CK ------------------------------------------------------------------------------------------------------------------ Invalid input(s): POCBNP    Mykayla Brinton D.O. on 04/20/2015 at 1:43 PM  Between 7am to 7pm - Pager - [phone]  After 7pm go to [email or web address] - password [email or web address]  And look for the night coverage person covering for me after  hours  [facility] Office  [phone]

## 2015-04-21 LAB — BASIC METABOLIC PANEL
Anion gap: 3 — ABNORMAL LOW (ref 5–15)
BUN: 33 mg/dL — AB (ref 6–20)
CALCIUM: 7.8 mg/dL — AB (ref 8.9–10.3)
CO2: 17 mmol/L — ABNORMAL LOW (ref 22–32)
CREATININE: 3.48 mg/dL — AB (ref 0.44–1.00)
Chloride: 116 mmol/L — ABNORMAL HIGH (ref 101–111)
GFR calc Af Amer: 14 mL/min — ABNORMAL LOW (ref >60)
GFR, EST NON AFRICAN AMERICAN: 12 mL/min — AB (ref >60)
Glucose, Bld: 70 mg/dL (ref 65–99)
Potassium: 4.1 mmol/L (ref 3.5–5.1)
Sodium: 136 mmol/L (ref 135–145)

## 2015-04-21 LAB — PROCALCITONIN: Procalcitonin: <0.1 ng/mL

## 2015-04-21 LAB — CBC
HCT: 26 % — ABNORMAL LOW (ref 36.0–46.0)
HEMOGLOBIN: 8.3 g/dL — AB (ref 12.0–15.0)
MCH: 30 pg (ref 26.0–34.0)
MCHC: 31.9 g/dL (ref 30.0–36.0)
MCV: 93.9 fL (ref 78.0–100.0)
PLATELETS: 211 10*3/uL (ref 150–400)
RBC: 2.77 MIL/uL — ABNORMAL LOW (ref 3.87–5.11)
RDW: 22.3 % — AB (ref 11.5–15.5)
WBC: 2.4 10*3/uL — ABNORMAL LOW (ref 4.0–10.5)

## 2015-04-21 MED ORDER — OXYCODONE-ACETAMINOPHEN 5-325 MG PO TABS: 1.0000 | ORAL_TABLET | ORAL | Status: AC | PRN

## 2015-04-21 NOTE — Discharge Instructions (Signed)
Pelvic Mass °A "mass" is a lump that either your caregiver found during an examination or you found before seeing your caregiver. The "pelvis" is the lower portion of the trunk in between the hip bones. There are many possible reasons why a lump has appeared. Testing will help determine the cause and the steps to a solution. °CAUSES  °Before complete testing is done, it may be difficult or impossible for your caregiver to know if the lump is truly in one of the pelvic organs (such as the uterus or ovaries) or is coming from one of many organs that are near the pelvis. Problems in the colon or kidney can also lead to a lump that might seem to be in the pelvis. °If testing shows that the mass is in the pelvis, there are still many possible causes: °· Tumors and cancers. These problems are relatively common and are the greatest source of worry for patients. Cancerous lumps in the pelvis may be due to cancers that started in the uterus or ovaries or due to cancers that started in other areas and then spread to the pelvis. Many cancers are very treatable when found early. °· Non-cancerous tumors and masses. There are a large number of common and uncommon non-cancerous problems that can lead to a mass in the pelvis. Two very common ones are fibroids of the uterus and ovarian cysts. Before testing and/or surgery, it may be impossible to tell the difference between these problems and a cancer. °· Infection. Certain types of infections can produce a mass in the pelvis. The infection might be caused by bacteria. If there is an infection treatment might include antibiotics. Masses from infection can also be caused by certain viruses, and in rare cases, by fungi or parasites. If infection is the cause, your caregiver will be able to determine the type of germ responsible for the mass by doing appropriate testing. °· Inflammatory bowel disease. These are diseases thought to be caused by a defect in the immune system of the  intestine. There are two inflammatory bowel diseases: Ulcerative Colitis and Crohn's Disease. They are lifelong problems with symptoms that can come and go. Sometimes, patients with these diseases will develop a mass in the lower part of the colon that can make it seem as though there is a mass in the pelvis. °· Past Surgery. If there has been pelvic surgery in the past, and there is a lot of scarring that forms during the process of healing, this can eventually fell like a mass when examined by your caregiver. As with the other problems described above, this may or may not be associated with symptoms or feeling badly. °· Ectopic Pregnancy. This is a condition where the growing fetus is growing outside of the uterus. This is a common cause for a pelvic mass and may become a serious or life-threatening problem that requires immediate surgery. °SYMPTOMS  °In people with a pelvic mass, there may be a large variety of associated symptoms including:  °· No symptoms, other than the appearance of the mass itself. °· Cramping, nausea, diarrhea. °· Fever, vomiting, weakness. °· Pelvic, side, and/or back pain. °· Weight loss. °· Constipation. °· Problems with vaginal bleeding. This can be very variable. Bleeding might be very light or very heavy. Bleeding may be mixed with large clots. Menstruation may be very frequent and may seem to almost completely stop. There may be varying levels of pain with menstruation. °· Urinary symptoms including frequent urination, inability to empty the   bladder completely, or urinating very small amounts. °DIAGNOSIS  °Because of the large number of causes of a mass in the pelvis, your caregiver will ask you to undergo testing in order to get a clear diagnosis in a timely manner. The tests might include some or all of the following: °· Blood tests such as a blood count, measurement of common minerals in the blood, kidney/liver/pancreas function, pregnancy test, and others. °· X-rays. Plain x-rays  and special x-rays may be requested except if you are pregnant. °· Ultrasound. This is a test that uses sound waves to "paint a picture" of the mass. The type of "sound picture" that is seen can help to narrow the diagnosis. °· CAT scan and MRI imaging. Each can provide additional information as to the different characteristics of the mass and can help to develop a final plan for diagnostics and treatment. If cancer is suspected, these special tests can also help to show any spread of the cancer to other parts of the body. It is possible that these tests may not be ordered if you are pregnant. °· Laparoscopy. This is a special exam of the inside of the pelvic area using a slim, flexible, lighted tube. This allows your caregiver to get a direct look at the mass. Sometimes, this allows getting a very small piece of the mass (a biopsy). This piece of tissue can then be examined in a lab that will frequently lead to a clear diagnosis. In some cases, your caregiver can use a laparoscope to completely remove the mass after it has been examined. °· Surgery. Sometimes, a diagnosis can only be made by carrying out an operation and obtaining a biopsy (as noted above). Many times, the biopsy is obtained and the mass is removed during the same operation. °These are the most common ways for determining the exact cause of the mass. Your caregiver may recommend other tests that are not listed here. °TREATMENT  °Treatment(s) can only be recommended after a diagnosis is made. Your caregiver will discuss your test results with you, the meanings of the tests, and the recommended steps to begin treatment. He/she will also recommend whether you need to be examined by specialists as you go through the steps of diagnosis and a treatment plan is developed. °HOME CARE INSTRUCTIONS  °· Test preparation. Carefully follow instructions when preparing for certain tests. This may involve many things such as: °¨ Drinking fluids to fill the bladder  before a pelvic ultrasound. °¨ Fasting before certain blood tests. °¨ Drinking special "contrast" fluids that are necessary for obtaining the best CAT scan and MRI images. °· Medications. Your caregiver may prescribe medications to help relieve symptoms while you undergo testing. It is important that your current medications (prescription, non-prescription, herbal, vitamins, etc.) be kept in mind when new prescriptions are recommended. °· Diet. There may be a need for changes in diet in order to help with symptom relief while testing is being done. If this applies to you, your caregiver will discuss these changes with you. °SEEK MEDICAL CARE IF:  °· You cannot hold down any of the recommended fluids used to prepare for tests such as CAT scan MRI. °· You feel that you are having trouble with any new prescriptions. °· You develop new symptoms of pain, vomiting, diarrhea, fever, or other problems that you did not feel since your last exam. °· You experience inability to empty your bladder completely or develop painful and/or bloody urination. °SEEK IMMEDIATE MEDICAL CARE IF:  °·   You vomit bright red blood, or a coffee ground appearing material. °· You have blood in your stools, or the stools turn black and tarry. °· You have an abnormal or increased amount of vaginal bleeding. °· You have a fever. °· You develop easy bruising or bleeding. °· You develop pain that is not controlled by your medication. °· You feel worsening weakness or you have a fainting episode. °· You feel that the mass has suddenly gotten larger. °· You develop severe bloating in the abdomen and/or pelvis. °· You cannot pass any urine. °MAKE SURE YOU:  °· Understand these instructions. °· Will watch your condition. °· Will get help right away if you are not doing well or get worse. °Document Released: 12/29/2006 Document Revised: 12/14/2011 Document Reviewed: 09/06/2007 °[employer]® Patient Information ©2015 [employer]. This information is not  intended to replace advice given to you by your health care provider. Make sure you discuss any questions you have with your health care provider. ° °

## 2015-04-21 NOTE — Clinical Social Work Placement (Signed)
   CLINICAL SOCIAL WORK PLACEMENT  NOTE  Date:  04/21/2015  Patient Details  Name: Erika Rivera MRN: [id number] Date of Birth: 10/14/1935  Clinical Social Work is seeking post-discharge placement for this patient at the [facility] level of care (*CSW will initial, date and re-position this form in  chart as items are completed):  Yes   Patient/family provided with [facility]'s list of facilities offering this level of care within the geographic area requested by the patient (or if unable, by the patient's family).  Yes   Patient/family informed of their freedom to choose among providers that offer the needed level of care, that participate in Medicare, Medicaid or managed care program needed by the patient, have an available bed and are willing to accept the patient.  Yes   Patient/family informed of [facility]'s ownership interest in [facility] and [facility], as well as of the fact that they are under no obligation to receive care at these facilities.  PASRR submitted to [employer]DS on 04/19/15     PASRR number received on 04/19/15     Existing PASRR number confirmed on       FL2 transmitted to all facilities in geographic area requested by pt/family on 04/19/15     FL2 transmitted to all facilities within larger geographic area on       Patient informed that his/her managed care company has contracts with or will negotiate with certain facilities, including the following:        Yes   Patient/family informed of bed offers received.  Patient chooses bed at    [facility] recommends and patient chooses bed at      Patient to be transferred to  [facility]  on  .April 21, 2015  Patient to be transferred to facility by    ambulance   Patient family notified on   April 21, 2015 of transfer.  Name of family member notified:     Bonita/daughter   PHYSICIAN Please sign FL2     Additional Comment:     _______________________________________________ Carlean Jews, LCSW 04/21/2015, 3:09 PM

## 2015-04-21 NOTE — Discharge Summary (Signed)
Physician Discharge Summary  Erika Rivera WNI:[id number] DOB: [birth year] DOA: 04/16/2015  PCP: Gwendolyn Grant, MD  Admit date: 04/16/2015 Discharge date: 04/21/2015  Time spent: 45 minutes  Recommendations for Outpatient Follow-up:  Patient will be discharged to skilled nursing facility. Patient will need continued physical as well as occupational therapy is recommended by the facility..  Patient will need to follow up with primary care provider within one week of discharge.  Patient should continue medications as prescribed.  Patient should follow a heart healthy diet.    Discharge Diagnoses:  Severe sepsis secondary to HCAP Acute on chronic kidney disease, stage IV to 5 Pelvic mass Metabolic acidosis Lower back pain with L1 compression fracture next line chronic anemia Dementia History of torsades  Discharge Condition: Stable  Diet recommendation: Heart healthy  Filed Weights   04/17/15 0600 04/19/15 0623  Weight: 77.2 kg (170 lb 3.1 oz) 78.291 kg (172 lb 9.6 oz)    History of present illness:  on 04/17/2015 by Dr. Gracelyn Nurse is a 79 y.o. female with history of chronic kidney disease and lupus nephritis, chronic anemia, history of AICD placement for torsades who was admitted last month for anemia and has received transfusion was brought to the ER after patient was complaining of worsening low back pain. As per patient's daughter patient has had a fall 3 weeks ago. Since then patient has been progressive low back pain. In the ER patient had CT renal study which showed L1 compression fracture which was age indeterminant with pelvic mass concerning for ovarian tumor. Patient also is found to have low normal blood pressure with worsening renal function. Patient was given 1 L normal saline bolus following which patient blood pressure improved. On my exam patient is also found to have hypothermia with temperature around 51F.  Hospital Course:  Severe sepsis  secondary to HCAP -Upon admission, patient was hypothermic, temperature 96.3, and leukopenic with white count of 2.3 -Patient was mildly hypotensive however was successfully resuscitated with IV fluids -Chest x-ray: Possible infiltration in the left lung base -Blood cultures show no growth to date -UA: many bacteria, WBC 0-2, negative leukocytes, nitrites -Initially placed on vancomycin and aztreonam, transitioned to Levaquin 500mg  Q48 hours (last dose 7/1/72016) -Strep pneumonia and legionella urine antigens negative  Acute on chronic kidney disease, stage 4-5/lupus nephritis -Creatinine currently 3.48, baseline appears to be approximately 2 (however, has been elevated in the past several months) -Spoke with PCP's office regarding worsening kidney function. Per office, patient has been declining.   Pelvic mass -Seen on CT renal study: 8.6 x 6.9 x 6.2 cm heterogenous mass within the mid pelvis, worrisome for ovarian malignancy -Abdominal and pelvic ultrasound: 8.6 cm solid mass with central cystic areas in the pelvic cul-de-sac posterior to the uterus -CA-125: 39.9 -Spoke with oncology, Dr. Cecille Po, who recommended waiting for the ultrasound results and possibly consulting Gyn-onc (as an outpatient) -GynOnc consulted and appreciated, recommended outpatient follow up with Dr. Denman George  Metabolic acidosis -Likely secondary to chronic renal failure, will continue to monitor   Lower back pain with L1 compression fracture -CT: Age-indeterminate compression fracture through the L1 vertebral body, proximal 20-30% of central height loss, 1-2 mm of bony retropulsion -Cannot obtain MRI due to AICD placement -PT consulted and recommended SNF  Chronic Anemia -Baseline hemoglobin approximately 10, currently 8.3 (possibly dilutional) -Continue iron supplementation; patient receives weekly epogen injections at [facility] -Given Epo 10K units on 04/19/2015 -Repeat CBC in one  week  Dementia -  Continue Aricept  History of torsades, status post AICD placement  Procedures  None  Consults  Gynecology, Dr. Cecille Po, via phone GynOnc Palliative care  Discharge Exam: Filed Vitals:   04/21/15 0511  BP: 120/41  Pulse: 80  Temp: 98.1 F (36.7 C)  Resp: 14   Exam  General: Well developed, well nourished, no distress  HEENT: NCAT, mucous membranes moist.   Cardiovascular: S1 S2 auscultated, RRR  Respiratory: Clear to auscultation  Extremities: warm dry without cyanosis clubbing. +LE Edema B/L- improving.  Some upper extremity edema.  Neuro: Awake and alert to self. Has history of dementia. Nonfocal  Discharge Instructions      Discharge Instructions    Discharge instructions    Complete by:  As directed   Patient will be discharged to skilled nursing facility. Patient will need continued physical as well as occupational therapy is recommended by the facility..  Patient will need to follow up with primary care provider within one week of discharge.  Patient should continue medications as prescribed.  Patient should follow a heart healthy diet.            Medication List    STOP taking these medications        amLODipine 10 MG tablet  Commonly known as:  NORVASC     ciprofloxacin 250 MG tablet  Commonly known as:  CIPRO     hydrochlorothiazide 12.5 MG capsule  Commonly known as:  MICROZIDE     potassium chloride 10 MEQ tablet  Commonly known as:  KLOR-CON M10     propranolol ER 160 MG SR capsule  Commonly known as:  INDERAL LA      TAKE these medications        aspirin 81 MG chewable tablet  Chew 81 mg by mouth daily.     CALCIUM + D PO  Take 1 capsule by mouth daily.     donepezil 10 MG tablet  Commonly known as:  ARICEPT  Take 1 tablet (10 mg total) by mouth at bedtime.     ferrous sulfate 325 (65 FE) MG tablet  Take 1 tablet (325 mg total) by mouth 2 (two) times daily with a meal.     levothyroxine 100  MCG tablet  Commonly known as:  SYNTHROID, LEVOTHROID  Take 1 tablet (100 mcg total) by mouth daily.     oxyCODONE-acetaminophen 5-325 MG per tablet  Commonly known as:  PERCOCET/ROXICET  Take 1 tablet by mouth every 4 (four) hours as needed for moderate pain.     simvastatin 20 MG tablet  Commonly known as:  ZOCOR  Take 1 tablet (20 mg total) by mouth daily at 6 PM.     tiZANidine 4 MG tablet  Commonly known as:  ZANAFLEX  Take 4 mg by mouth every 6 (six) hours as needed for muscle spasms.     traMADol 50 MG tablet  Commonly known as:  ULTRAM  Take 1 tablet (50 mg total) by mouth every 6 (six) hours as needed for moderate pain.     triamcinolone cream 0.1 %  Commonly known as:  KENALOG  Apply 1 application topically daily.     Vitamin D 2000 UNITS Caps  Take 4,000 Units by mouth daily.       Allergies  Allergen Reactions  . Diphenhydramine Hcl Anaphylaxis    Tongue swelling  . Penicillins Rash   Follow-up Information    Follow up with Donaciano Eva, MD On 04/29/2015.   Specialty:  Obstetrics and Gynecology   Why:  at 11 am at the [facility].  Arrive at 10:30am to register.     Contact information:   [place] [address] [phone]       Follow up with Gwendolyn Grant, MD. Schedule an appointment as soon as possible for a visit in 1 week.   Specialty:  Internal Medicine   Why:  Hospital follow up, repeat CBC   Contact information:   [address] [place] [address] [phone]        The results of significant diagnostics from this hospitalization (including imaging, microbiology, ancillary and laboratory) are listed below for reference.    Significant Diagnostic Studies: US Abdomen Complete  04/18/2015   CLINICAL DATA:  Abnormal gallbladder on prior CT.  EXAM: ULTRASOUND ABDOMEN COMPLETE  COMPARISON:  CT 04/17/2015.  FINDINGS: Gallbladder: Sludge and tiny gallstones noted. Gallbladder wall  thickness 3.0 mm. Negative Murphy sign. No pericholecystic fluid collection.  Common bile duct: Diameter: 5.1 mm  Liver: No focal lesion identified. Within normal limits in parenchymal echogenicity.  IVC: No abnormality visualized.  Pancreas: Visualized portion unremarkable.  Spleen: Size and appearance within normal limits.  Right Kidney: Length: 8.9 cm. Cortical thinning . Echogenicity within normal limits. 3.4 cm and 1.5 cm simple cysts. No hydronephrosis visualized.  Left Kidney: Length: 7.9 cm. Cortical thinning . Echogenicity within normal limits. 3.7 cm simple cyst. No hydronephrosis visualized.  Abdominal aorta: No aneurysm visualized.  Other findings: None.  IMPRESSION: 1. Sludge and tiny gallstones. Gallbladder wall thickness 3.0 mm. Negative Murphy sign. No pericholecystic fluid collection. No biliary distention. 2. Bilateral renal cortical thinning.  Bilateral simple renal cysts.   Electronically Signed   By: Marcello Moores  Register   On: 04/18/2015 08:39   US Transvaginal Non-ob  04/18/2015   CLINICAL DATA:  Pelvic pain. Pelvic mass seen on recent CT. Postmenopausal female.  EXAM: TRANSABDOMINAL AND TRANSVAGINAL ULTRASOUND OF PELVIS  TECHNIQUE: Both transabdominal and transvaginal ultrasound examinations of the pelvis were performed. Transabdominal technique was performed for global imaging of the pelvis including uterus, ovaries, adnexal regions, and pelvic cul-de-sac. It was necessary to proceed with endovaginal exam following the transabdominal exam to visualize the adnexal mass and ovaries.  COMPARISON:  CT on 04/17/2015  FINDINGS: Uterus  Measurements: 5.4 x 3.2 x 4.1 cm. No fibroids or other mass visualized.  Endometrium  Thickness: 2 mm.  No focal abnormality visualized.  Right ovary  Measurements: Not directly visualized by transabdominal or transvaginal sonography.  Left ovary  Measurements: Not directly visualized by transabdominal or transvaginal sonography.  Other findings  A solid mass  containing central cystic areas is seen in the pelvic cul-de-sac posterior to the uterus which measures 8.6 x 6.5 x 7.2 cm. This shows internal blood flow on color Doppler ultrasound. Small amount of free fluid also noted.  IMPRESSION: 8.6 cm solid mass with central cystic areas in the pelvic cul-de-sac posterior to the uterus. Differential diagnosis includes ovarian neoplasm and pedunculated fibroid with cystic degeneration. Recommend pelvic MRI without and with contrast for further evaluation.  Small amount of free fluid.   Electronically Signed   By: Earle Gell M.D.   On: 04/18/2015 08:45   US Pelvis Complete  04/18/2015   CLINICAL DATA:  Pelvic pain. Pelvic mass seen on recent CT. Postmenopausal female.  EXAM: TRANSABDOMINAL AND TRANSVAGINAL ULTRASOUND OF PELVIS  TECHNIQUE: Both transabdominal and transvaginal ultrasound examinations of the pelvis were  performed. Transabdominal technique was performed for global imaging of the pelvis including uterus, ovaries, adnexal regions, and pelvic cul-de-sac. It was necessary to proceed with endovaginal exam following the transabdominal exam to visualize the adnexal mass and ovaries.  COMPARISON:  CT on 04/17/2015  FINDINGS: Uterus  Measurements: 5.4 x 3.2 x 4.1 cm. No fibroids or other mass visualized.  Endometrium  Thickness: 2 mm.  No focal abnormality visualized.  Right ovary  Measurements: Not directly visualized by transabdominal or transvaginal sonography.  Left ovary  Measurements: Not directly visualized by transabdominal or transvaginal sonography.  Other findings  A solid mass containing central cystic areas is seen in the pelvic cul-de-sac posterior to the uterus which measures 8.6 x 6.5 x 7.2 cm. This shows internal blood flow on color Doppler ultrasound. Small amount of free fluid also noted.  IMPRESSION: 8.6 cm solid mass with central cystic areas in the pelvic cul-de-sac posterior to the uterus. Differential diagnosis includes ovarian neoplasm and  pedunculated fibroid with cystic degeneration. Recommend pelvic MRI without and with contrast for further evaluation.  Small amount of free fluid.   Electronically Signed   By: Earle Gell M.D.   On: 04/18/2015 08:45   Dg Chest Port 1 View  04/17/2015   CLINICAL DATA:  Hypotension.  EXAM: PORTABLE CHEST - 1 VIEW  COMPARISON:  12/08/2011  FINDINGS: Cardiac pacemaker. Shallow inspiration. Suggestion of infiltration in the left lung base which may indicate pneumonia. No blunting of costophrenic angles. No pneumothorax. Calcified and tortuous aorta.  IMPRESSION: Shallow inspiration.  Possible infiltration in the left lung base.   Electronically Signed   By: Lucienne Capers M.D.   On: 04/17/2015 03:43   Ct Renal Stone Study  04/17/2015   CLINICAL DATA:  Initial evaluation for lower back pain with right flank pain.  EXAM: CT ABDOMEN AND PELVIS WITHOUT CONTRAST  TECHNIQUE: Multidetector CT imaging of the abdomen and pelvis was performed following the standard protocol without IV contrast.  COMPARISON:  None.  FINDINGS: Small layering bilateral pleural effusions are present, left slightly larger than right. There is associated mild bibasilar atelectasis. Cardiac pacemaker electrodes partially visualized. No pericardial effusion.  Limited noncontrast evaluation of the liver is unremarkable. Hyperdensity within the gallbladder lumen most compatible with stones and/or sludge. No evidence for acute cholecystitis. No biliary dilatation. Spleen and adrenal glands are within normal limits. A few scattered dystrophic calcifications noted within the pancreatic body. Pancreas is otherwise unremarkable.  3.8 cm cyst present within the interpolar right kidney. Additional 1.6 cm cyst present within the upper pole of the right kidney. Additional subcentimeter hypodensity within the interpolar right kidney is too small the characterize by CT, but may reflect an additional small cyst. No evidence for right-sided nephrolithiasis or  hydronephrosis. No radiopaque calculi seen along the course of the right renal collecting system. There is no right-sided hydroureter PICC  4 cm cyst present within the interpolar left kidney. There is an additional 1.5 cm cyst immediately posteriorly within the left kidney. No evidence for left-sided nephrolithiasis or hydronephrosis. Calcification at the left renal hilum favored to be vascular in nature. No radiopaque calculi seen along the course of the left renal collecting system. There is no left-sided hydroureter.  Small hiatal hernia noted. Stomach otherwise unremarkable. No evidence for bowel obstruction. Appendix within normal limits. No acute inflammatory changes seen about the bowels.  Bladder is largely decompressed but grossly unremarkable.  There is an ovoid mass that measures 8.6 x 6.9 x 6.2 cm centered  in the mid pelvis (series 2, image 69). This lesion demonstrates internal heterogeneous attenuation with fluid and soft tissue density. There is a small amount of adjacent free fluid within the pelvis. Is unclear whether this lesion is ovarian in nature. This lies immediately posterior to the uterus, and appears separate from the uterus with a thin cleft of fluid dividing the two. Peripheral soft tissue about this lesion may reflect normal ovarian tissue flattened around the periphery of the lesion. There appears to be a normal left ovary laterally (series 2, image 68). Right ovary is not discretely identified. The distal colon lies immediately posterior to this lesion.  No free intraperitoneal air. No peritoneal studding or carcinomatosis. No pathologically enlarged lymph nodes. Advanced aorto bi-iliac atherosclerotic calcifications present. No aneurysm.  Diffuse anasarca noted within the external soft tissues.  Scoliosis with multilevel degenerative disc disease present. Thin sclerosis with slight height loss present through the superior aspect of the L1 vertebral body, consistent with an  age-indeterminate compression fracture. This may be acute to subacute in nature. Minimal bony retropulsion of approximately 2 mm. Central height loss measures approximately 20-30%. No other acute osseous abnormality. No worrisome lytic or blastic osseous lesion.  IMPRESSION: 1. 8.6 x 6.9 x 6.2 cm heterogeneous mass centered within the mid pelvis with small amount of adjacent free fluid. This lesion is indeterminate, but worrisome for possible primary ovarian malignancy. Further evaluation with pelvic ultrasound with gynecologic referral recommended. 2. Age indeterminate compression fracture through the L1 vertebral body with approximately 20-30% of central height loss and minimal 1-2 mm of bony retropulsion. Correlation with physical exam and site of pain recommended. 3. Bilateral pleural effusions, left greater than right, with diffuse anasarca. 4. Stones and/or sludge within the gallbladder lumen. No CT evidence for acute cholecystitis. 5. Bilateral renal cysts as above. No nephrolithiasis or obstructive uropathy.   Electronically Signed   By: Jeannine Boga M.D.   On: 04/17/2015 01:07    Microbiology: Recent Results (from the past 240 hour(s))  Culture, blood (routine x 2)     Status: None (Preliminary result)   Collection Time: 04/17/15  4:50 AM  Result Value Ref Range Status   Specimen Description BLOOD LEFT HAND  Final   Special Requests BOTTLES DRAWN AEROBIC ONLY 2ML  Final   Culture   Final    NO GROWTH 3 DAYS Performed at [facility]    Report Status PENDING  Incomplete  Culture, blood (routine x 2)     Status: None (Preliminary result)   Collection Time: 04/17/15  4:59 AM  Result Value Ref Range Status   Specimen Description BLOOD LEFT FOREARM  Final   Special Requests BOTTLES DRAWN AEROBIC AND ANAEROBIC 2ML  Final   Culture   Final    NO GROWTH 3 DAYS Performed at [facility]    Report Status PENDING  Incomplete  MRSA PCR Screening     Status: None    Collection Time: 04/17/15  5:58 AM  Result Value Ref Range Status   MRSA by PCR NEGATIVE NEGATIVE Final    Comment:        The GeneXpert MRSA Assay (FDA approved for NASAL specimens only), is one component of a comprehensive MRSA colonization surveillance program. It is not intended to diagnose MRSA infection nor to guide or monitor treatment for MRSA infections.      Labs: Basic Metabolic Panel:  Recent Labs Lab 04/17/15 0841 04/18/15 0445 04/19/15 0420 04/20/15 0530 04/21/15 0424  NA 138  134* 134* 135 136  K 3.8 3.9 4.1 4.1 4.1  CL 115* 112* 113* 114* 116*  CO2 16* 15* 16* 17* 17*  GLUCOSE 78 71 75 62* 70  BUN 34* 35* 36* 35* 33*  CREATININE 3.74* 3.58* 3.68* 3.42* 3.48*  CALCIUM 8.3* 7.9* 7.9* 7.7* 7.8*   Liver Function Tests:  Recent Labs Lab 04/16/15 2346 04/17/15 0841  AST 15 16  ALT 8* 8*  ALKPHOS 69 76  BILITOT 0.8 0.7  PROT 5.6* 5.8*  ALBUMIN 2.0* 2.1*   No results for input(s): LIPASE, AMYLASE in the last 168 hours. No results for input(s): AMMONIA in the last 168 hours. CBC:  Recent Labs Lab 04/16/15 2346 04/17/15 0841 04/19/15 0420 04/20/15 0530 04/21/15 0424  WBC 2.5* 2.3* 2.5* 2.1* 2.4*  NEUTROABS 1.2* 1.2* 1.0*  --   --   HGB 8.7* 9.0* 8.3* 7.8* 8.3*  HCT 26.3* 27.4* 25.7* 23.9* 26.0*  MCV 93.3 93.8 90.8 91.9 93.9  PLT 203 200 202 193 211   Cardiac Enzymes: No results for input(s): CKTOTAL, CKMB, CKMBINDEX, TROPONINI in the last 168 hours. BNP: BNP (last 3 results) No results for input(s): BNP in the last 8760 hours.  ProBNP (last 3 results) No results for input(s): PROBNP in the last 8760 hours.  CBG:  Recent Labs Lab 04/20/15 0804  GLUCAP 94       Signed:  Dajah Fischman  [facility] 04/21/2015, 10:20 AM

## 2015-04-21 NOTE — Clinical Social Work Note (Signed)
CSW called [employer] to gain authorization for pt SNF  CSW faxed appropriate paperwork to [employer]   CSW received authorization [phone] [phone]  .Dede Query, LCSW [facility] Clinical Social Worker - Weekend Coverage cell #: [phone]

## 2015-04-22 LAB — CULTURE, BLOOD (ROUTINE X 2)
Culture: NO GROWTH
Culture: NO GROWTH

## 2015-04-24 NOTE — Telephone Encounter (Signed)
Thayer Headings from [place] called to let you know patient is now at [facility]

## 2015-04-29 ENCOUNTER — Ambulatory Visit: Payer: Commercial Managed Care - HMO | Attending: Gynecologic Oncology | Admitting: Gynecologic Oncology

## 2015-04-29 ENCOUNTER — Encounter: Payer: Self-pay | Admitting: Gynecologic Oncology

## 2015-04-29 VITALS — BP 127/66 | HR 78 | Temp 97.5°F | Ht 65.0 in | Wt 170.8 lb

## 2015-04-29 DIAGNOSIS — R1900 Intra-abdominal and pelvic swelling, mass and lump, unspecified site: Secondary | ICD-10-CM | POA: Diagnosis present

## 2015-04-29 NOTE — Patient Instructions (Signed)
Please see attached form for recommendations.  Please call for any questions or concerns.

## 2015-04-29 NOTE — Progress Notes (Signed)
Consult Note: Gyn-Onc  Consult was requested by Dr. Dr Ree Kida for the evaluation of Erika Rivera 79 y.o. female with a pelvic mass (8cm)  CC:  Chief Complaint  Patient presents with  . Pelvic mass    new consult    Assessment/Plan:  Ms. Erika Rivera  is a 78 y.o.  year old with severe, progressive demention, chronic renal insufficiency and an incidentally found 8cm complex ovarian mass on CT imaging performed during a recent hospitalization. It is associated with a mildly elevated CA 125.  I have some concern that this mass could represent a malignant process, however I discussed with the patient's niece that benign ovarian masses can appear similar to this on imaging and BX associated with mild elevations in CA-125. I discussed that regardless of whether this mass is benign or malignant, I do not recommend surgical removal because she is not a candidate for adjuvant chemotherapy which would be necessary as this is an ovarian cancer, and surgery would not be curative on its own. Additionally this mass is not symptomatic and therefore removal for benign mass is contraindicated in a patient with progressive dementia and renal failure.  I discussed with the patient's family members that surgical intervention would likely bring on severe morbidity, and deterioration of her residual functional status. Given that surgery would not alter postoperative treatments or preoperative symptoms, and because it would be associated with a certain decline in her general functional status, is not indicated.  With respect to her left upper extremity edema I some concern that they could be an underlying thrombus associated with this and have made written recommendations to her nursing home facility that this be further worked up.  I provided the patient's family with my contact information and they can reach out to her office if they've any ongoing questions, however I will not schedule any follow-up for  this patient this time given the intervention is not planned.   HPI: Erika Rivera is a 79 year old African-American woman with severe dementia who is seen in consultation at the request of Dr.Mikhail  for a pelvic mass.  The patient was recently admitted to [facility] on 04/18/2015 for aggressive falls at home and general deterioration. She has severe dementia and requires maximal assistance by family members in all activities of daily living including dressing, eating, meal preparation, and has minimal ambulatory capabilities. She was diagnosed during this admission with severe sepsis secondary to pneumonia. She was also identified to have acute on chronic kidney disease stage 4-5. Because of severe lower back pain a CT scan was performed of the abdomen and pelvis which identified a pelvic mass a pelvic ultrasound was then performed on 04/18/2015 to further evaluate this mass. It identified a solid mass containing central cystic areas seen in the pelvic cul-de-sac posterior to the uterus measuring 8.6 x 6.5 x 7.2 cm. It showed internal blood flow: Doppler, and a small amount of free fluid also noted. A CA-125 was drawn on 04/17/2015 which was elevated slightly at 39.9 units per milliliter.  The patient recovered somewhat from her hospital stay but remained more debilitated than she was prior to admission and was transferred to nursing home facility. It is unclear what will, about from her further workup of her renal disease and progressive dementia. Sincer her discharge from hospital, her left UE has become increasingly edematous.  Current Meds:  Outpatient Encounter Prescriptions as of 04/29/2015  Medication Sig  . aspirin 81 MG chewable tablet Chew 81  mg by mouth daily.  . Calcium Carbonate-Vitamin D (CALCIUM + D PO) Take 1 capsule by mouth daily.   . Cholecalciferol (VITAMIN D) 2000 UNITS CAPS Take 4,000 Units by mouth daily.  Marland Kitchen donepezil (ARICEPT) 10 MG tablet Take 1 tablet (10 mg  total) by mouth at bedtime.  . ferrous sulfate 325 (65 FE) MG tablet Take 1 tablet (325 mg total) by mouth 2 (two) times daily with a meal. (Patient taking differently: Take 325 mg by mouth daily. )  . levothyroxine (SYNTHROID, LEVOTHROID) 100 MCG tablet Take 1 tablet (100 mcg total) by mouth daily.  Marland Kitchen oxyCODONE-acetaminophen (PERCOCET/ROXICET) 5-325 MG per tablet Take 1 tablet by mouth every 4 (four) hours as needed for moderate pain.  . simvastatin (ZOCOR) 20 MG tablet Take 1 tablet (20 mg total) by mouth daily at 6 PM.  . tiZANidine (ZANAFLEX) 4 MG tablet Take 4 mg by mouth every 6 (six) hours as needed for muscle spasms.  . traMADol (ULTRAM) 50 MG tablet Take 1 tablet (50 mg total) by mouth every 6 (six) hours as needed for moderate pain.  Marland Kitchen triamcinolone cream (KENALOG) 0.1 % Apply 1 application topically daily. (Patient not taking: Reported on 04/29/2015)   No facility-administered encounter medications on file as of 04/29/2015.    Allergy:  Allergies  Allergen Reactions  . Diphenhydramine Hcl Anaphylaxis    Tongue swelling  . Penicillins Rash    Social Hx:   History   Social History  . Marital Status: Widowed    Spouse Name: N/A  . Number of Children: 2  . Years of Education: 14   Occupational History  . Not on file.   Social History Main Topics  . Smoking status: Former Smoker -- 0.60 packs/day    Quit date: 11/28/2011  . 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 Alcohol Use: No  . Drug Use: No  . Sexual Activity: Not on file   Other Topics Concern  . Not on file   Social History Narrative    Past Surgical Hx:  Past Surgical History  Procedure Laterality Date  . Cardiac defibrillator placement  12/07/11    SJM ICD implanted for recurrent Torsades with syncope  . Aneursym clipping      cerebral  . Left heart catheterization with coronary angiogram N/A 12/07/2011    Procedure: LEFT HEART  CATHETERIZATION WITH CORONARY ANGIOGRAM;  Surgeon: Sherren Mocha, MD;  Location: [facility] CATH LAB;  Service: Cardiovascular;  Laterality: N/A;  . Implantable cardioverter defibrillator implant N/A 12/07/2011    Procedure: IMPLANTABLE CARDIOVERTER DEFIBRILLATOR IMPLANT;  Surgeon: Thompson Grayer, MD;  Location: [facility] CATH LAB;  Service: Cardiovascular;  Laterality: N/A;    Past Medical Hx:  Past Medical History  Diagnosis Date  . ARTHRITIS, HAND   . ARTHRITIS, LEFT SHOULDER   . HEARING LOSS   . SMOKER   . CEREBROVASCULAR ACCIDENT, HX OF   . HYPERTENSION   . HYPERLIPIDEMIA   . HYPOTHYROIDISM   . OVERACTIVE BLADDER   . Cerebral aneurysm     s/p clipping  . Polymorphic ventricular tachycardia     s/p St Jude AICD (dual chamber) 12/2011;  Echo: mild LVH, EF 50-555, grade 1 diast dysfxn, trivial AI.   Marland Kitchen Torsade de pointes     PVC dependent  . Syncope     2/2 VTach  . Bradycardia     A paced by AICD  .  Essential tremor   . Osteopenia 11/18/2012    10/14/12 DEXA @ [facility]: -2.2  . Allergy   . Arthritis   . CAD (coronary artery disease)     LHC 12/2011: RCA 75% (neg FFR), EF 55-65% - med Tx  . Dementia   . Vitamin B 12 deficiency   . Hypothyroidism   . Chronic kidney disease, stage III (moderate)   . High blood pressure with chronic kidney disease   . Stroke     With rupture of intra-cranial aneurysm  . Polymorphic ventricular tachycardia     s/p ICD placement  . Proteinuria   . Lupus     Pt reports "lupus of skin"    Past Gynecological History:  SVDx 3  No LMP recorded. Patient is postmenopausal.  Family Hx:  Family History  Problem Relation Age of Onset  . Gout Father   . Kidney disease Father     kidney failure  . Arthritis Other   . Hypertension Other   . Kidney disease Other   . Heart disease Other   . Thyroid disease Other   . Colon polyps Neg Hx   . Esophageal cancer Neg Hx   . Rectal cancer Neg Hx   . Stomach cancer Neg Hx     Review of Systems:  Unable to accurately  assess secondary to dementia  Vitals:  Blood pressure 127/66, pulse 78, temperature 97.5 F (36.4 C), temperature source Oral, height 5\' 5"  (1.651 m), weight 170 lb 12.8 oz (77.474 kg), SpO2 100 %.  Physical Exam: WD in NAD Neck  Supple NROM, without any enlargements.  Lymph Node Survey No cervical supraclavicular or inguinal adenopathy Cardiovascular  Pulse normal rate, regularity and rhythm. S1 and S2 normal.  Left upper extremity grossly edematous but well perfused. Lungs  Clear to auscultation bilateraly, without wheezes/crackles/rhonchi. Good air movement.  Skin  No rash/lesions/breakdown  Psychiatry  Alert and oriented to person, place, and time  Abdomen  Normoactive bowel sounds, abdomen soft, non-tender and ovewreight without evidence of hernia.  Back + tenderness lumbar Genito Urinary  Vulva/vagina: Normal external female genitalia.  No lesions. No discharge or bleeding. Excoriation secondary likely to urine soaking of medial thighs.  Bladder/urethra:  No lesions or masses, well supported bladder  Vagina: normal  Cervix: Normal to palpate  Uterus and adnexa: difficult to assess secondary to inabilty to assume a lithotomy position due to discomfort and contractures. No gross pelvic masses appreciated Rectal  deferred Extremities  No bilateral cyanosis, clubbing or edema.   Donaciano Eva, MD  04/29/2015, 5:24 PM

## 2015-05-01 ENCOUNTER — Telehealth: Payer: Self-pay | Admitting: Internal Medicine

## 2015-05-01 NOTE — Telephone Encounter (Signed)
Spoke to dtr and dtr stated that [facility] would pay for 24 hr home care. I questioned that they understood what coverage the pt has and requested that they call [employer] and confirm the difference b/t hospice and 24 hr home health care.

## 2015-05-01 NOTE — Telephone Encounter (Signed)
Patients daughter called stating patient went to her ob oncologist and told her that all they can do for her is try to keep her comfortable. She is requesting authorization to bring her home and to provide home care and physical therapy.  Please call her at [phone] or  Her cell [phone].

## 2015-05-09 ENCOUNTER — Ambulatory Visit: Payer: Commercial Managed Care - HMO | Admitting: Neurology

## 2015-05-10 ENCOUNTER — Ambulatory Visit: Payer: Commercial Managed Care - HMO | Admitting: Neurology

## 2015-05-13 ENCOUNTER — Encounter: Payer: Self-pay | Admitting: Neurology

## 2015-05-13 ENCOUNTER — Ambulatory Visit (INDEPENDENT_AMBULATORY_CARE_PROVIDER_SITE_OTHER): Payer: Commercial Managed Care - HMO | Admitting: Neurology

## 2015-05-13 VITALS — BP 104/68 | HR 66 | Resp 18 | Ht 65.0 in | Wt 161.6 lb

## 2015-05-13 DIAGNOSIS — G309 Alzheimer's disease, unspecified: Secondary | ICD-10-CM

## 2015-05-13 DIAGNOSIS — R251 Tremor, unspecified: Secondary | ICD-10-CM

## 2015-05-13 DIAGNOSIS — F0280 Dementia in other diseases classified elsewhere without behavioral disturbance: Secondary | ICD-10-CM

## 2015-05-13 DIAGNOSIS — E538 Deficiency of other specified B group vitamins: Secondary | ICD-10-CM | POA: Diagnosis not present

## 2015-05-13 NOTE — Progress Notes (Signed)
Erika Rivera was seen today in the movement disorders clinic for neurologic f/u for tremor and memory loss.   This patient is accompanied in the office by her child who supplements the history.   I weaned her off the L-dopa because I did not think that she was parkinsonian.  She has noticed no difference off of the medication compared to when she was on the medication.  She continues to have tremor that is most bothersome when she goes to pour something or when she eats.  She remains on propranolol 160 mg daily.  She was started on primidone last visit.  She is having no SE but she doesn't think it has helped.  05/31/13 update:    The patient is accompanied by her daughter who supplements the history.  Her memory is about the same.  Sometimes, her daughter states that she will revert back to "her childhood." She is not exercising.  She sits and watches TV all day and may do some "puzzles."  She will sometimes with water on. Her daughter does the cooking and driving.  Her daughter does state that she has trouble walking long distances and would like to see if they can get a handicap sticker.  The patient herself does not drive.  Last visit, her primidone was increased for tremor.  Her tremor has been markedly improved.  She does have a history of B12 deficiency and has been getting B12 injections faithfully.  11/10/13:  This patient is accompanied in the office by her child who supplements the history.  The patient has a history of dementia.  Memory has been about the same.  She is on Aricept, 5 mg which was started last August.  She is tolerating it well.  She continues to receive monthly B12 injections.  She continues to have some degree of tremor, but her daughter states that she rarely complains about it.  Today in the office, however, she complains that she has difficulty doing her crossword puzzles because of it.  Her daughter states that she does not notice any limitations because of the  tremor.  05/11/2014 update: Patient presents today for followup, accompanied by her daughter who supplements the history.  Patient has a history of Alzheimer's dementia.  Last visit, her Aricept was increased to 10 mg. Memory has been stable. I have been trying to wean her primidone for tremor. Still has tremor but is no worse with the wean. She is currently on 50 mg once per day.  She continues to receive B12 injections monthly for B12 deficiency.  11/09/13 update:  Pt returns for f/u, accompanied by her daughter who supplements the history.  Patient has AD.  She is on Aricept and doing well.  Memory has been stable.  I have weaned her off of her primidone.  She is still receiving her B12 injections for memory.  She has had no falls.  She isn't doing much acitivity other than staying in her room watching TV.  Her daughter would like to try to get her in the adult day care center 2 days a week.    05/13/15 update:  The patient is following up today, accompanied by her daughter who supplements the history.  Records were reviewed since last visit.  She has a history of dementia and is on Aricept, 10 mg daily.  She was admitted to the hospital from June 17 to June 18 with symptomatic anemia with a hemoglobin of 6.3.  She was transfused 2 units  of packed red blood cells during that admission.  She was readmitted from July 13 to July 17.  She had sepsis with HCAP, acute on chronic kidney disease and a pelvic mass with some concerns for an ovarian neoplasm.  Palliative med was c/s.   Her daughter isn't ready to turn off the defibrillator, however, and she therefore is not is hospice.  She is going to stay at the [facility] full time now due to weakness and bowel incontinence.   She saw GYN oncology on July 25 and ultimately it was determined that even if this mass was cancerous that she was not a candidate for surgical removal because she was not a candidate for chemotherapy and surgery alone would not be curative.  Her daughter  notes that having more trouble with eating and doesn't seem to know what to do with the food along with decreased appetite.  Ask for supplement like ensure.  Having renal failure as well but told not candidate for dialysis.  There is no fam hx of tremor.   PREVIOUS MEDICATIONS: Sinemet  ALLERGIES:   Allergies  Allergen Reactions  . Diphenhydramine Hcl Anaphylaxis    Tongue swelling  . Penicillins Rash    CURRENT MEDICATIONS:  Current Outpatient Prescriptions on File Prior to Visit  Medication Sig Dispense Refill  . aspirin 81 MG chewable tablet Chew 81 mg by mouth daily.    . Calcium Carbonate-Vitamin D (CALCIUM + D PO) Take 1 capsule by mouth daily.     . Cholecalciferol (VITAMIN D) 2000 UNITS CAPS Take 4,000 Units by mouth daily.    Marland Kitchen donepezil (ARICEPT) 10 MG tablet Take 1 tablet (10 mg total) by mouth at bedtime. 90 tablet 1  . ferrous sulfate 325 (65 FE) MG tablet Take 1 tablet (325 mg total) by mouth 2 (two) times daily with a meal. (Patient taking differently: Take 325 mg by mouth daily. ) 60 tablet 3  . levothyroxine (SYNTHROID, LEVOTHROID) 100 MCG tablet Take 1 tablet (100 mcg total) by mouth daily. 90 tablet 3  . oxyCODONE-acetaminophen (PERCOCET/ROXICET) 5-325 MG per tablet Take 1 tablet by mouth every 4 (four) hours as needed for moderate pain. 30 tablet 0  . simvastatin (ZOCOR) 20 MG tablet Take 1 tablet (20 mg total) by mouth daily at 6 PM. 90 tablet 3  . tiZANidine (ZANAFLEX) 4 MG tablet Take 4 mg by mouth every 6 (six) hours as needed for muscle spasms.    . traMADol (ULTRAM) 50 MG tablet Take 1 tablet (50 mg total) by mouth every 6 (six) hours as needed for moderate pain. 30 tablet 0  . triamcinolone cream (KENALOG) 0.1 % Apply 1 application topically daily. 45 g 0   No current facility-administered medications on file prior to visit.    PAST MEDICAL HISTORY:   Past Medical History  Diagnosis Date  . ARTHRITIS, HAND   . ARTHRITIS, LEFT SHOULDER   . HEARING LOSS    . SMOKER   . CEREBROVASCULAR ACCIDENT, HX OF   . HYPERTENSION   . HYPERLIPIDEMIA   . HYPOTHYROIDISM   . OVERACTIVE BLADDER   . Cerebral aneurysm     s/p clipping  . Polymorphic ventricular tachycardia     s/p St Jude AICD (dual chamber) 12/2011;  Echo: mild LVH, EF 50-555, grade 1 diast dysfxn, trivial AI.   Marland Kitchen Torsade de pointes     PVC dependent  . Syncope     2/2 VTach  . Bradycardia     A  paced by AICD  . Essential tremor   . Osteopenia 11/18/2012    10/14/12 DEXA @ [facility]: -2.2  . Allergy   . Arthritis   . CAD (coronary artery disease)     LHC 12/2011: RCA 75% (neg FFR), EF 55-65% - med Tx  . Dementia   . Vitamin B 12 deficiency   . Hypothyroidism   . Chronic kidney disease, stage III (moderate)   . High blood pressure with chronic kidney disease   . Stroke     With rupture of intra-cranial aneurysm  . Polymorphic ventricular tachycardia     s/p ICD placement  . Proteinuria   . Lupus     Pt reports "lupus of skin"    PAST SURGICAL HISTORY:   Past Surgical History  Procedure Laterality Date  . Cardiac defibrillator placement  12/07/11    SJM ICD implanted for recurrent Torsades with syncope  . Aneursym clipping      cerebral  . Left heart catheterization with coronary angiogram N/A 12/07/2011    Procedure: LEFT HEART CATHETERIZATION WITH CORONARY ANGIOGRAM;  Surgeon: Sherren Mocha, MD;  Location[facility] CATH LAB;  Service: Cardiovascular;  Laterality: N/A;  . Implantable cardioverter defibrillator implant N/A 12/07/2011    Procedure: IMPLANTABLE CARDIOVERTER DEFIBRILLATOR IMPLANT;  Surgeon: Thompson Grayer, MD;  Location: [facility] CATH LAB;  Service: Cardiovascular;  Laterality: N/A;    SOCIAL HISTORY:   History   Social History  . Marital Status: Widowed    Spouse Name: N/A  . Number of Children: 2  . Years of Education: 14   Occupational History  . Not on file.   Social History Main Topics  . Smoking status: Former Smoker -- 0.60 packs/day    Quit date: 11/28/2011  .  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 Alcohol Use: No  . Drug Use: No  . Sexual Activity: No   Other Topics Concern  . Not on file   Social History Narrative    FAMILY HISTORY:   Family Status  Relation Status Death Age  . Father Deceased     renal failure  . Mother Deceased     unknown cause of death  . Sister Alive     unknown medical health  . Brother Alive     healthy  . Brother Deceased     2, one at birth w/ hydrocephalous  . Child Deceased     CA  . Child Alive     alive and well    ROS:  A complete 10 system review of systems was obtained and was unremarkable apart from what is mentioned above.  PHYSICAL EXAMINATION:    VITALS:   Filed Vitals:   05/13/15 1144  BP: 104/68  Pulse: 66  Resp: 18  Height: 5\' 5"  (1.651 m)  Weight: 161 lb 9.6 oz (73.301 kg)    GEN:  The patient appears stated age and is in NAD. HEENT:  Normocephalic, atraumatic.  The mucous membranes are moist. The superficial temporal arteries are without ropiness or tenderness. CV:  RRR Lungs:  CTAB Neck/HEME:  There are no carotid bruits bilaterally. Exts:  There is edema in the LUE  Neurological examination:  Orientation: A MoCA was done 11/10/13 and the pt scored 13/30.  Today, she is aware that her birthday is next week.   Cranial nerves: There is good facial symmetry.  Extraocular muscles are intact. The visual  fields are full to confrontational testing. The speech is fluent and clear. Soft palate rises symmetrically and there is no tongue deviation. Hearing is decreased to conversational tone. Sensation: Sensation is intact to light touch throughout. Motor: Strength is 5/5 in the bilateral upper and lower extremities.   Shoulder shrug is equal and symmetric.  There is no pronator drift.   Movement examination: Tone: There is normal tone in the bilateral upper extremities.  The tone in the lower extremities  is normal.  Abnormal movements: There is tremor on the R with the outstretched hand and mild tremor on the R only with clock drawing. Coordination:  There is no decremation with RAM's in the hands or feet, tested with alternating supination/pronation, finger taps, hand opening and closing and heel and toe taps. Gait and Station: The patient has minimal difficulty arising out of a deep-seated chair without the use of the hands. Gait is steady.  LABS:   Lab Results  Component Value Date   VITAMINB12 288 07/22/2012      ASSESSMENT:   1.  Tremor.  I see no evidence of idiopathic PD or any of the parkinsonian syndromes.  Tremor may be due to prior brain injury from aneurysm and resulting encephalomalacia.  She is off of carbidopa/levodopa without changes from previous. 2.  Memory loss.  I suspect that this represents a dementia of the Alzheimers type but it could be vascular in nature. 3.  Mild B12 deficiency. 4.  Hx of cerebral aneurysm greater than 20 years ago.  PLAN:   1.  We discussed the diagnosis as well as pathophysiology of the disease.  We discussed treatment options as well as prognostic indicators.  Patient education was provided. 2.  Because of memory, I stopped the primidone and daughter noted increase in tremor but talked to them today about fact that I don't think that restarting would be of benefit and they agree. 3. We can d/c the b12 injections given prominent other medical issues 4.  continue aricept 10 mg daily.  Risks, benefits, side effects and alternative therapies were discussed.  The opportunity to ask questions was given and they were answered to the best of my ability.  The patient expressed understanding and willingness to follow the outlined treatment protocols. 5.  Talked about the importance of doing physical and mental/social activities at the [facility]. 6.  Talked to pt/daughter about contacting nephrology and asking them about fluid restriction/protein  supplement 7.  F/u prn.  Much greater than 50% of this visit was spent in counseling with the patient and the family.  Total face to face time:  40 min

## 2015-05-23 ENCOUNTER — Encounter (HOSPITAL_COMMUNITY): Payer: Self-pay | Admitting: *Deleted

## 2015-05-23 ENCOUNTER — Emergency Department (HOSPITAL_COMMUNITY)
Admission: EM | Admit: 2015-05-23 | Discharge: 2015-05-24 | Disposition: A | Discharge status: Home / Self Care | Payer: Commercial Managed Care - HMO | Attending: Emergency Medicine | Admitting: Emergency Medicine

## 2015-05-23 DIAGNOSIS — E039 Hypothyroidism, unspecified: Secondary | ICD-10-CM | POA: Insufficient documentation

## 2015-05-23 DIAGNOSIS — D649 Anemia, unspecified: Secondary | ICD-10-CM | POA: Insufficient documentation

## 2015-05-23 DIAGNOSIS — Z880 Allergy status to penicillin: Secondary | ICD-10-CM | POA: Diagnosis not present

## 2015-05-23 DIAGNOSIS — Z79899 Other long term (current) drug therapy: Secondary | ICD-10-CM | POA: Diagnosis not present

## 2015-05-23 DIAGNOSIS — I129 Hypertensive chronic kidney disease with stage 1 through stage 4 chronic kidney disease, or unspecified chronic kidney disease: Secondary | ICD-10-CM | POA: Insufficient documentation

## 2015-05-23 DIAGNOSIS — Z8673 Personal history of transient ischemic attack (TIA), and cerebral infarction without residual deficits: Secondary | ICD-10-CM | POA: Diagnosis not present

## 2015-05-23 DIAGNOSIS — M19012 Primary osteoarthritis, left shoulder: Secondary | ICD-10-CM | POA: Insufficient documentation

## 2015-05-23 DIAGNOSIS — Z87891 Personal history of nicotine dependence: Secondary | ICD-10-CM | POA: Diagnosis not present

## 2015-05-23 DIAGNOSIS — I2510 Atherosclerotic heart disease of native coronary artery without angina pectoris: Secondary | ICD-10-CM | POA: Diagnosis not present

## 2015-05-23 DIAGNOSIS — Z7982 Long term (current) use of aspirin: Secondary | ICD-10-CM | POA: Diagnosis not present

## 2015-05-23 DIAGNOSIS — F0390 Unspecified dementia without behavioral disturbance: Secondary | ICD-10-CM | POA: Diagnosis not present

## 2015-05-23 DIAGNOSIS — Z9889 Other specified postprocedural states: Secondary | ICD-10-CM | POA: Insufficient documentation

## 2015-05-23 DIAGNOSIS — E785 Hyperlipidemia, unspecified: Secondary | ICD-10-CM | POA: Insufficient documentation

## 2015-05-23 DIAGNOSIS — H9190 Unspecified hearing loss, unspecified ear: Secondary | ICD-10-CM | POA: Insufficient documentation

## 2015-05-23 DIAGNOSIS — N183 Chronic kidney disease, stage 3 (moderate): Secondary | ICD-10-CM | POA: Insufficient documentation

## 2015-05-23 LAB — CBC WITH DIFFERENTIAL/PLATELET
BASOS PCT: 1 % (ref 0–1)
Basophils Absolute: 0.1 10*3/uL (ref 0.0–0.1)
Eosinophils Absolute: 0.5 10*3/uL (ref 0.0–0.7)
Eosinophils Relative: 11 % — ABNORMAL HIGH (ref 0–5)
HCT: 23.8 % — ABNORMAL LOW (ref 36.0–46.0)
Hemoglobin: 7.3 g/dL — ABNORMAL LOW (ref 12.0–15.0)
Lymphocytes Relative: 21 % (ref 12–46)
Lymphs Abs: 1 10*3/uL (ref 0.7–4.0)
MCH: 29 pg (ref 26.0–34.0)
MCHC: 30.7 g/dL (ref 30.0–36.0)
MCV: 94.4 fL (ref 78.0–100.0)
MONO ABS: 0.5 10*3/uL (ref 0.1–1.0)
MONOS PCT: 11 % (ref 3–12)
Neutro Abs: 2.6 10*3/uL (ref 1.7–7.7)
Neutrophils Relative %: 56 % (ref 43–77)
Platelets: 228 10*3/uL (ref 150–400)
RBC: 2.52 MIL/uL — ABNORMAL LOW (ref 3.87–5.11)
RDW: 18.1 % — ABNORMAL HIGH (ref 11.5–15.5)
WBC: 4.6 10*3/uL (ref 4.0–10.5)

## 2015-05-23 LAB — BASIC METABOLIC PANEL
Anion gap: 6 (ref 5–15)
BUN: 28 mg/dL — ABNORMAL HIGH (ref 6–20)
CO2: 24 mmol/L (ref 22–32)
CREATININE: 2.62 mg/dL — AB (ref 0.44–1.00)
Calcium: 8.1 mg/dL — ABNORMAL LOW (ref 8.9–10.3)
Chloride: 114 mmol/L — ABNORMAL HIGH (ref 101–111)
GFR calc Af Amer: 19 mL/min — ABNORMAL LOW (ref >60)
GFR calc non Af Amer: 17 mL/min — ABNORMAL LOW (ref >60)
Glucose, Bld: 88 mg/dL (ref 65–99)
Potassium: 3.4 mmol/L — ABNORMAL LOW (ref 3.5–5.1)
Sodium: 144 mmol/L (ref 135–145)

## 2015-05-23 LAB — PREPARE RBC (CROSSMATCH)

## 2015-05-23 MED ORDER — SODIUM CHLORIDE 0.9 % IV SOLN: 10.0000 mL/h | Freq: Once | INTRAVENOUS | Status: DC

## 2015-05-23 NOTE — ED Notes (Addendum)
Per EMS, pt brought in due to her Hgb being 6.7. Labs were drawn yesterday. Pt denies complaints at this time. Pt is from [facility]. Pt has DNR

## 2015-05-23 NOTE — ED Notes (Signed)
Bed: [facility] Expected date:  Expected time:  Means of arrival:  Comments: Ems 79 yo

## 2015-05-23 NOTE — ED Provider Notes (Signed)
CSN: [id number]     Arrival date & time 05/23/15  52 History   First MD Initiated Contact with Patient 05/23/15 1608     Chief Complaint  Patient presents with  . Anemia     (Consider location/radiation/quality/duration/timing/severity/associated sxs/prior Treatment) HPI Comments: 79 year old female with extensive past medical history including dementia, CAD, CVA, chronic anemia who presents with anemia. History limited due to the patient's dementia and obtained primarily from the patient's daughter, who is not POA. Daughter states that she was brought in by EMS for low blood counts found at her rehabilitation facility when labs were drawn yesterday. Per EMS report, her hemoglobin was 6.7 yesterday. Daughter states that she has a long history of anemia and has required blood transfusion and "anemia shots" in the past. Daughter is unaware of any recent fevers or illness, however she has not seen the patient today. The patient herself denies any complaints.  Patient is a 79 y.o. female presenting with anemia. The history is provided by a relative.  Anemia    Past Medical History  Diagnosis Date  . ARTHRITIS, HAND   . ARTHRITIS, LEFT SHOULDER   . HEARING LOSS   . SMOKER   . CEREBROVASCULAR ACCIDENT, HX OF   . HYPERTENSION   . HYPERLIPIDEMIA   . HYPOTHYROIDISM   . OVERACTIVE BLADDER   . Cerebral aneurysm     s/p clipping  . Polymorphic ventricular tachycardia     s/p St Jude AICD (dual chamber) 12/2011;  Echo: mild LVH, EF 50-555, grade 1 diast dysfxn, trivial AI.   Marland Kitchen Torsade de pointes     PVC dependent  . Syncope     2/2 VTach  . Bradycardia     A paced by AICD  . Essential tremor   . Osteopenia 11/18/2012    10/14/12 DEXA @ [facility]: -2.2  . Allergy   . Arthritis   . CAD (coronary artery disease)     LHC 12/2011: RCA 75% (neg FFR), EF 55-65% - med Tx  . Dementia   . Vitamin B 12 deficiency   . Hypothyroidism   . Chronic kidney disease, stage III (moderate)   . High blood  pressure with chronic kidney disease   . Stroke     With rupture of intra-cranial aneurysm  . Polymorphic ventricular tachycardia     s/p ICD placement  . Proteinuria   . Lupus     Pt reports "lupus of skin"   Past Surgical History  Procedure Laterality Date  . Cardiac defibrillator placement  12/07/11    SJM ICD implanted for recurrent Torsades with syncope  . Aneursym clipping      cerebral  . Left heart catheterization with coronary angiogram N/A 12/07/2011    Procedure: LEFT HEART CATHETERIZATION WITH CORONARY ANGIOGRAM;  Surgeon: Sherren Mocha, MD;  Location: [facility] CATH LAB;  Service: Cardiovascular;  Laterality: N/A;  . Implantable cardioverter defibrillator implant N/A 12/07/2011    Procedure: IMPLANTABLE CARDIOVERTER DEFIBRILLATOR IMPLANT;  Surgeon: Thompson Grayer, MD;  Location: [facility] CATH LAB;  Service: Cardiovascular;  Laterality: N/A;   Family History  Problem Relation Age of Onset  . Gout Father   . Kidney disease Father     kidney failure  . Arthritis Other   . Hypertension Other   . Kidney disease Other   . Heart disease Other   . Thyroid disease Other   . Colon polyps Neg Hx   . Esophageal cancer Neg Hx   . Rectal cancer Neg Hx   .  Stomach cancer Neg Hx    Social History  Substance Use Topics  . Smoking status: Former Smoker -- 0.60 packs/day    Quit date: 11/28/2011  . 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 Alcohol Use: No   OB History    No data available     Review of Systems  Unable to perform ROS: Dementia      Allergies  Diphenhydramine hcl and Penicillins  Home Medications   Prior to Admission medications   Medication Sig Start Date End Date Taking? Authorizing Provider  aspirin 81 MG chewable tablet Chew 81 mg by mouth daily.   Yes Historical Provider, MD  calcium-vitamin D (OSCAL WITH D) 500-200 MG-UNIT per tablet Take 1 tablet by mouth daily with breakfast.   Yes  Historical Provider, MD  cholecalciferol (VITAMIN D) 1000 UNITS tablet Take 2,000 Units by mouth daily.   Yes Historical Provider, MD  donepezil (ARICEPT) 10 MG tablet Take 1 tablet (10 mg total) by mouth at bedtime. 12/10/14  Yes Rebecca S Tat, DO  ferrous sulfate 325 (65 FE) MG tablet Take 1 tablet (325 mg total) by mouth 2 (two) times daily with a meal. 04/10/15  Yes Rowe Clack, MD  levothyroxine (SYNTHROID, LEVOTHROID) 100 MCG tablet Take 1 tablet (100 mcg total) by mouth daily. 12/26/14  Yes Rowe Clack, MD  oxyCODONE-acetaminophen (PERCOCET/ROXICET) 5-325 MG per tablet Take 1 tablet by mouth every 4 (four) hours as needed for moderate pain. 04/21/15  Yes Maryann Mikhail, DO  simvastatin (ZOCOR) 20 MG tablet Take 1 tablet (20 mg total) by mouth daily at 6 PM. 12/26/14  Yes Rowe Clack, MD  tiZANidine (ZANAFLEX) 4 MG tablet Take 4 mg by mouth every 6 (six) hours as needed for muscle spasms.   Yes Historical Provider, MD  torsemide (DEMADEX) 20 MG tablet Take 20 mg by mouth daily.   Yes Historical Provider, MD  traMADol (ULTRAM) 50 MG tablet Take 1 tablet (50 mg total) by mouth every 6 (six) hours as needed for moderate pain. 04/19/15  Yes Maryann Mikhail, DO  Vitamin D, Ergocalciferol, (DRISDOL) 50000 UNITS CAPS capsule Take 50,000 Units by mouth every 7 (seven) days.   Yes Historical Provider, MD  triamcinolone cream (KENALOG) 0.1 % Apply 1 application topically daily. Patient not taking: Reported on 05/23/2015 03/01/15   Rowe Clack, MD   BP 147/106 mmHg  Pulse 69  Temp(Src) 98 F (36.7 C) (Oral)  Resp 23  SpO2 98% Physical Exam  Constitutional: No distress.  Thin, frail-appearing elderly woman in no acute distress  HENT:  Head: Normocephalic and atraumatic.  Moist mucous membranes  Eyes: Pupils are equal, round, and reactive to light.  Pale conjunctivae  Neck: Neck supple.  Cardiovascular: Normal rate, regular rhythm and normal heart sounds.   No murmur  heard. Pulmonary/Chest: Effort normal and breath sounds normal.  Abdominal: Soft. Bowel sounds are normal. She exhibits no distension. There is no tenderness.  Musculoskeletal:  2+ pitting edema of bilateral lower extremities  Neurological: She is alert.  Oriented to person, moves all 4 extremities  Skin: Skin is warm and dry.  Psychiatric:  Calm, however requests to go home  Nursing note and vitals reviewed.   ED Course  Procedures (including critical care time) Labs Review Labs Reviewed  BASIC METABOLIC PANEL - Abnormal; Notable for the following:    Potassium 3.4 (*)  Chloride 114 (*)    BUN 28 (*)    Creatinine, Ser 2.62 (*)    Calcium 8.1 (*)    GFR calc non Af Amer 17 (*)    GFR calc Af Amer 19 (*)    All other components within normal limits  CBC WITH DIFFERENTIAL/PLATELET - Abnormal; Notable for the following:    RBC 2.52 (*)    Hemoglobin 7.3 (*)    HCT 23.8 (*)    RDW 18.1 (*)    Eosinophils Relative 11 (*)    All other components within normal limits  TYPE AND SCREEN  PREPARE RBC (CROSSMATCH)    Imaging Review No results found. I have personally reviewed and evaluated these lab results as part of my medical decision-making.   EKG Interpretation None      MDM   Final diagnoses:  None  acute on chronic anemia    79 year old female with multiple medical problems including chronic anemia who presents with low hemoglobin discovered on routine labs yesterday. At presentation, the patient was awake, alert, oriented to person and in no acute distress. Vital signs are unremarkable. No complaints during interview. Obtained basic labs above including type and screen.  I have discussed goals of care with the patient's daughter who states the patient was recently diagnosed with intra-abdominal tumor that is likely cancer and she will not be undergoing any treatment. Daughter states that patient desires comfort care only and is DO NOT RESUSCITATE/DO NOT  INTUBATE. She states that the patient would be amenable to blood transfusion for symptom relief. Hemoglobin is 7.3 with hematocrit 23.8. I discussed with the patient's family members the risks and benefits of transfusion, including but not limited to allergic reaction or transmission of the virus. They voiced understanding of risks. They elected to proceed with transfusion because of concern that the patient will eventually require transfusion in the next few days. I have ordered 1 unit PRBCs for transfusion. I am signing the patient out to the oncoming attending and she is awaiting receipt of the blood from blood Bank. I anticipate the patient will be discharged home after blood transfusion.  CRITICAL CARE Performed by: Wenda Overland Zissy Hamlett   Total critical care time: 30 minutes  Critical care time was exclusive of separately billable procedures and treating other patients.  Critical care was necessary to treat or prevent imminent or life-threatening deterioration.  Critical care was time spent personally by me on the following activities: development of treatment plan with patient and/or surrogate as well as nursing, discussions with consultants, evaluation of patient's response to treatment, examination of patient, obtaining history from patient or surrogate, ordering and performing treatments and interventions, ordering and review of laboratory studies, ordering and review of radiographic studies, pulse oximetry and re-evaluation of patient's condition.   Sharlett Iles, MD 05/23/15 [id number]

## 2015-05-23 NOTE — ED Notes (Signed)
RN at bedside for lab draws

## 2015-05-24 LAB — TYPE AND SCREEN
ABO/RH(D): O POS
ANTIBODY SCREEN: NEGATIVE
Unit division: 0

## 2015-05-24 NOTE — Discharge Instructions (Signed)
Anemia, Nonspecific Anemia is a condition in which the concentration of red blood cells or hemoglobin in the blood is below normal. Hemoglobin is a substance in red blood cells that carries oxygen to the tissues of the body. Anemia results in not enough oxygen reaching these tissues.  CAUSES  Common causes of anemia include:   Excessive bleeding. Bleeding may be internal or external. This includes excessive bleeding from periods (in women) or from the intestine.   Poor nutrition.   Chronic kidney, thyroid, and liver disease.  Bone marrow disorders that decrease red blood cell production.  Cancer and treatments for cancer.  HIV, AIDS, and their treatments.  Spleen problems that increase red blood cell destruction.  Blood disorders.  Excess destruction of red blood cells due to infection, medicines, and autoimmune disorders. SIGNS AND SYMPTOMS   Minor weakness.   Dizziness.   Headache.  Palpitations.   Shortness of breath, especially with exercise.   Paleness.  Cold sensitivity.  Indigestion.  Nausea.  Difficulty sleeping.  Difficulty concentrating. Symptoms may occur suddenly or they may develop slowly.  DIAGNOSIS  Additional blood tests are often needed. These help your health care provider determine the best treatment. Your health care provider will check your stool for blood and look for other causes of blood loss.  TREATMENT  Treatment varies depending on the cause of the anemia. Treatment can include:   Supplements of iron, vitamin B12, or folic acid.   Hormone medicines.   A blood transfusion. This may be needed if blood loss is severe.   Hospitalization. This may be needed if there is significant continual blood loss.   Dietary changes.  Spleen removal. HOME CARE INSTRUCTIONS Keep all follow-up appointments. It often takes many weeks to correct anemia, and having your health care provider check on your condition and your response to  treatment is very important. SEEK IMMEDIATE MEDICAL CARE IF:   You develop extreme weakness, shortness of breath, or chest pain.   You become dizzy or have trouble concentrating.  You develop heavy vaginal bleeding.   You develop a rash.   You have bloody or black, tarry stools.   You faint.   You vomit up blood.   You vomit repeatedly.   You have abdominal pain.  You have a fever or persistent symptoms for more than 2-3 days.   You have a fever and your symptoms suddenly get worse.   You are dehydrated.  MAKE SURE YOU:  Understand these instructions.  Will watch your condition.  Will get help right away if you are not doing well or get worse. Document Released: 10/29/2004 Document Revised: 05/24/2013 Document Reviewed: 03/17/2013 ExitCare Patient Information 2015 [employer]. This information is not intended to replace advice given to you by your health care provider. Make sure you discuss any questions you have with your health care provider.  

## 2015-05-29 ENCOUNTER — Encounter: Payer: Self-pay | Admitting: Podiatry

## 2015-05-29 ENCOUNTER — Ambulatory Visit (INDEPENDENT_AMBULATORY_CARE_PROVIDER_SITE_OTHER): Payer: Commercial Managed Care - HMO | Admitting: Podiatry

## 2015-05-29 DIAGNOSIS — B351 Tinea unguium: Secondary | ICD-10-CM | POA: Diagnosis not present

## 2015-05-29 DIAGNOSIS — M79676 Pain in unspecified toe(s): Secondary | ICD-10-CM | POA: Diagnosis not present

## 2015-05-29 NOTE — Patient Instructions (Signed)
The left heel has a eschar (dry scab that is pre-ulcerative) Use a heel protector on the left heel or a leg cradle on the left leg

## 2015-05-30 NOTE — Progress Notes (Signed)
Patient ID: Erika Rivera, female   DOB: 02/07/1936, 79 y.o.   MRN: [id number]   Subjective: Patient presents today with her daughter present who is requesting debridement of painful toenails. The last visit for this schedule services on 12/04/2014 [name] also concerned about a eschar and left heel and a dry bloody area in the left hallux without a history of open wounds on known duration. No specific treatment  Chart review Dr. Erasmo Downer notes 12/04/2014 Neurovascular status unchanged pedal pulses palpable DP and PT 1 over 4 bilateral. Refill time 3 seconds. Nails thick, brittle, crumbly dystrophic and friable one through 5 bilaterally. Hair growth absent Nails brittle and crumbly and tender on palpation and include shoe wear multiple keratoses noted. Callus first IP joint left great toe and keratoses sub-1-5 right and sub-fifth met base right with keratotic lesions all debridement at this time no open wounds or no ulcers no secondary infections Assessment this time is arthropathy as well as a history of onychomycoses painful mycotic toenails debrided 1 through 5 right are debrided return for future palliative nail care and skin care for every 3 months  Objective: Patient is confused and daughter answering questions Pitting edema bilaterally DP and PT pulses 1/4 bilaterally  Dermatological: The posterior left heel has dry eschar without any surrounding erythema or edema or drainage Distal left hallux is dry bleeding skin without any surrounding erythema edema The toenails are elongated, brittle, incurvated, discolored and patient is reactive to direct palpation  Assessment: Peripheral edema bilaterally Diminished pedal pulses bilaterally Pre-ulcerative eschar left heel and distal left hallux without a clinical sign of infection Painful mycotic toenails 6-10  Plan: At this time I made patient's daughter wear that the left heel eschar and I blood left hallux were socially with  friction and rub. I recommended a heel protector or leg cradle left and application of Vaseline to these areas  The toenails 10 were debrided mechanically and electrically without any bleeding  Reappoint at three-month intervals

## 2015-06-21 ENCOUNTER — Encounter: Payer: Self-pay | Admitting: Internal Medicine

## 2015-06-21 ENCOUNTER — Ambulatory Visit (INDEPENDENT_AMBULATORY_CARE_PROVIDER_SITE_OTHER): Payer: Commercial Managed Care - HMO | Admitting: Internal Medicine

## 2015-06-21 VITALS — BP 160/68 | HR 70 | Ht 65.0 in | Wt 159.0 lb

## 2015-06-21 DIAGNOSIS — I4729 Other ventricular tachycardia: Secondary | ICD-10-CM

## 2015-06-21 DIAGNOSIS — R001 Bradycardia, unspecified: Secondary | ICD-10-CM | POA: Diagnosis not present

## 2015-06-21 DIAGNOSIS — I472 Ventricular tachycardia: Secondary | ICD-10-CM | POA: Diagnosis not present

## 2015-06-21 LAB — CUP PACEART INCLINIC DEVICE CHECK
Battery Remaining Longevity: 46.8 mo
Brady Statistic RV Percent Paced: 43 %
Date Time Interrogation Session: 20160916144113
HighPow Impedance: 45 Ohm
Lead Channel Impedance Value: 425 Ohm
Lead Channel Pacing Threshold Amplitude: 0.75 V
Lead Channel Pacing Threshold Amplitude: 0.75 V
Lead Channel Pacing Threshold Pulse Width: 0.5 ms
Lead Channel Sensing Intrinsic Amplitude: 1.4 mV
Lead Channel Sensing Intrinsic Amplitude: 11.8 mV
Lead Channel Setting Pacing Amplitude: 2.5 V
Lead Channel Setting Sensing Sensitivity: 0.5 mV
MDC IDC MSMT LEADCHNL RA IMPEDANCE VALUE: 312.5 Ohm
MDC IDC MSMT LEADCHNL RV PACING THRESHOLD PULSEWIDTH: 0.5 ms
MDC IDC SET LEADCHNL RA PACING AMPLITUDE: 2 V
MDC IDC SET LEADCHNL RV PACING PULSEWIDTH: 0.5 ms
MDC IDC SET ZONE DETECTION INTERVAL: 320 ms
MDC IDC SET ZONE DETECTION INTERVAL: 350 ms
MDC IDC STAT BRADY RA PERCENT PACED: 91 %
Pulse Gen Serial Number: 7016049

## 2015-06-21 NOTE — Patient Instructions (Signed)
Medication Instructions:  Your physician recommends that you continue on your current medications as directed. Please refer to the Current Medication list given to you today.   Labwork: None ordered  Testing/Procedures: None ordered  Follow-Up: Your physician wants you to follow-up in: 12 months with Dr Rayann Heman Dennis Bast will receive a reminder letter in the mail two months in advance. If you don't receive a letter, please call our office to schedule the follow-up appointment.   Remote monitoring is used to monitor your  ICD from home. This monitoring reduces the number of office visits required to check your device to one time per year. It allows [place] to keep an eye on the functioning of your device to ensure it is working properly. You are scheduled for a device check from home on 09/23/15. You may send your transmission at any time that day. If you have a wireless device, the transmission will be sent automatically. After your physician reviews your transmission, you will receive a postcard with your next transmission date.  Keep [name] monitor connected at bedside at all times    Any Other Special Instructions Will Be Listed Below (If Applicable).

## 2015-06-22 NOTE — Progress Notes (Signed)
PCP: Gwendolyn Grant, MD  Erika Rivera is a 79 y.o. female who presents today for routine electrophysiology followup.  She continues to decline.  She has chronic renal failure as well as dementia.  Today, she denies symptoms of palpitations, chest pain, shortness of breath,  lower extremity edema, dizziness, presyncope, syncope, or ICD shocks.  The patient is otherwise without complaint today.   Past Medical History  Diagnosis Date  . ARTHRITIS, HAND   . ARTHRITIS, LEFT SHOULDER   . HEARING LOSS   . SMOKER   . CEREBROVASCULAR ACCIDENT, HX OF   . HYPERTENSION   . HYPERLIPIDEMIA   . HYPOTHYROIDISM   . OVERACTIVE BLADDER   . Cerebral aneurysm     s/p clipping  . Polymorphic ventricular tachycardia     s/p St Jude AICD (dual chamber) 12/2011;  Echo: mild LVH, EF 50-555, grade 1 diast dysfxn, trivial AI.   Marland Kitchen Torsade de pointes     PVC dependent  . Syncope     2/2 VTach  . Bradycardia     A paced by AICD  . Essential tremor   . Osteopenia 11/18/2012    10/14/12 DEXA @ [facility]: -2.2  . Allergy   . Arthritis   . CAD (coronary artery disease)     LHC 12/2011: RCA 75% (neg FFR), EF 55-65% - med Tx  . Dementia   . Vitamin B 12 deficiency   . Hypothyroidism   . Chronic kidney disease, stage III (moderate)   . High blood pressure with chronic kidney disease   . Stroke     With rupture of intra-cranial aneurysm  . Polymorphic ventricular tachycardia     s/p ICD placement  . Proteinuria   . Lupus     Pt reports "lupus of skin"   Past Surgical History  Procedure Laterality Date  . Cardiac defibrillator placement  12/07/11    SJM ICD implanted for recurrent Torsades with syncope  . Aneursym clipping      cerebral  . Left heart catheterization with coronary angiogram N/A 12/07/2011    Procedure: LEFT HEART CATHETERIZATION WITH CORONARY ANGIOGRAM;  Surgeon: Sherren Mocha, MD;  Location: [facility] CATH LAB;  Service: Cardiovascular;  Laterality: N/A;  . Implantable cardioverter defibrillator  implant N/A 12/07/2011    Procedure: IMPLANTABLE CARDIOVERTER DEFIBRILLATOR IMPLANT;  Surgeon: Thompson Grayer, MD;  Location: [facility] CATH LAB;  Service: Cardiovascular;  Laterality: N/A;   ROS- all systems are reviewed and negative except as per HPI above  Current Outpatient Prescriptions  Medication Sig Dispense Refill  . aspirin 81 MG chewable tablet Chew 81 mg by mouth daily.    . calcium-vitamin D (OSCAL WITH D) 500-200 MG-UNIT per tablet Take 1 tablet by mouth daily with breakfast.    . cholecalciferol (VITAMIN D) 1000 UNITS tablet Take 2,000 Units by mouth daily.    Marland Kitchen donepezil (ARICEPT) 10 MG tablet Take 1 tablet (10 mg total) by mouth at bedtime. 90 tablet 1  . ferrous sulfate 325 (65 FE) MG tablet Take 1 tablet (325 mg total) by mouth 2 (two) times daily with a meal. 60 tablet 3  . levothyroxine (SYNTHROID, LEVOTHROID) 100 MCG tablet Take 1 tablet (100 mcg total) by mouth daily. 90 tablet 3  . oxyCODONE-acetaminophen (PERCOCET/ROXICET) 5-325 MG per tablet Take 1 tablet by mouth every 4 (four) hours as needed for moderate pain. 30 tablet 0  . simvastatin (ZOCOR) 20 MG tablet Take 1 tablet (20 mg total) by mouth daily at 6 PM. 90  tablet 3  . tiZANidine (ZANAFLEX) 4 MG tablet Take 4 mg by mouth every 6 (six) hours as needed for muscle spasms.    Marland Kitchen torsemide (DEMADEX) 20 MG tablet Take 20 mg by mouth daily.    . traMADol (ULTRAM) 50 MG tablet Take 1 tablet (50 mg total) by mouth every 6 (six) hours as needed for moderate pain. 30 tablet 0  . triamcinolone cream (KENALOG) 0.1 % Apply 1 application topically daily. 45 g 0  . Vitamin D, Ergocalciferol, (DRISDOL) 50000 UNITS CAPS capsule Take 50,000 Units by mouth every 7 (seven) days.     No current facility-administered medications for this visit.    Physical Exam: Filed Vitals:   06/21/15 1417  BP: 160/68  Pulse: 70  Height: 5\' 5"  (1.651 m)  Weight: 159 lb (72.122 kg)    GEN- The patient is elderly appearing, alert and oriented x 3  today.   Head- normocephalic, atraumatic Eyes-  Sclera clear, conjunctiva pink Ears- hearing intact Oropharynx- clear Lungs- Clear to ausculation bilaterally, normal work of breathing Chest- ICD pocket is well healed Heart- Regular rate and rhythm, no murmurs, rubs or gallops, PMI not laterally displaced GI- soft, NT, ND, + BS Extremities- no clubbing, cyanosis, or edema  ICD interrogation- reviewed in detail today,  See PACEART report  Assessment and Plan:  1. Polymorphic VT (PVC initiated) Normal ICD function See Pace Art report  2. Sinus bradycardia Stable Would continue to use her ICD for pacing even if we turn tachycardia therapies off in the future  3. HTN Stable No change required today  4. CRI Dr Posey Pronto is following  DNR/DNI I had a long discussion with patient and her 2 daughters today.  They would prefer a palliative approach overall.  They are clear that she will be DNI/DNR.  I have advised that we turn ICD shock therapies off, however, they are not ready to do this.  They will contact my office if they change their mind.  merlin Return to see me in a year

## 2015-07-23 ENCOUNTER — Ambulatory Visit (HOSPITAL_COMMUNITY): Admission: RE | Admit: 2015-07-23 | Payer: Commercial Managed Care - HMO | Source: Ambulatory Visit

## 2015-07-23 ENCOUNTER — Ambulatory Visit (HOSPITAL_COMMUNITY)
Admission: RE | Admit: 2015-07-23 | Discharge: 2015-07-23 | Disposition: A | Discharge status: Home / Self Care | Payer: Commercial Managed Care - HMO | Source: Ambulatory Visit | Attending: Nephrology | Admitting: Nephrology

## 2015-07-23 DIAGNOSIS — D631 Anemia in chronic kidney disease: Secondary | ICD-10-CM | POA: Insufficient documentation

## 2015-07-23 DIAGNOSIS — N189 Chronic kidney disease, unspecified: Secondary | ICD-10-CM | POA: Insufficient documentation

## 2015-07-23 LAB — PREPARE RBC (CROSSMATCH)

## 2015-07-23 MED ORDER — SODIUM CHLORIDE 0.9 % IV SOLN
Freq: Once | INTRAVENOUS | Status: AC
  Administered 2015-07-23: 10 mL/h via INTRAVENOUS

## 2015-07-23 MED ORDER — FUROSEMIDE 10 MG/ML IJ SOLN
40.0000 mg | Freq: Once | INTRAMUSCULAR | Status: AC
  Administered 2015-07-23: 40 mg via INTRAVENOUS

## 2015-07-23 MED ORDER — FUROSEMIDE 10 MG/ML IJ SOLN
INTRAMUSCULAR | Status: AC
  Administered 2015-07-23: 40 mg via INTRAVENOUS
  Filled 2015-07-23: qty 4

## 2015-07-23 MED ORDER — FUROSEMIDE 10 MG/ML IJ SOLN
40.0000 mg | Freq: Once | INTRAMUSCULAR | Status: DC
  Filled 2015-07-23: qty 4

## 2015-07-23 NOTE — Procedures (Signed)
[facility]  Procedure Note  ALANII RAMER PBA:[id number] DOB: 04/30/1936 DOA: 07/23/2015   PCP: Gwendolyn Grant, MD   Associated Diagnosis: Anemia in CKD (D63.1)  Procedure Note: Transfusion of 2 units PRBCs; IV lasix   Condition During Procedure:  Pt tolerated well    Condition at Discharge:  No complications noted; pt accompanied by family   Nigel Sloop, [facility]

## 2015-07-24 LAB — TYPE AND SCREEN
ABO/RH(D): O POS
ANTIBODY SCREEN: NEGATIVE
UNIT DIVISION: 0
Unit division: 0

## 2015-08-12 ENCOUNTER — Ambulatory Visit (INDEPENDENT_AMBULATORY_CARE_PROVIDER_SITE_OTHER): Payer: Commercial Managed Care - HMO | Admitting: Internal Medicine

## 2015-08-12 ENCOUNTER — Other Ambulatory Visit (INDEPENDENT_AMBULATORY_CARE_PROVIDER_SITE_OTHER): Payer: Commercial Managed Care - HMO

## 2015-08-12 ENCOUNTER — Telehealth: Payer: Self-pay

## 2015-08-12 ENCOUNTER — Encounter: Payer: Self-pay | Admitting: Internal Medicine

## 2015-08-12 VITALS — BP 124/78 | HR 100 | Temp 97.4°F | Ht 65.0 in | Wt 167.1 lb

## 2015-08-12 DIAGNOSIS — H9193 Unspecified hearing loss, bilateral: Secondary | ICD-10-CM

## 2015-08-12 DIAGNOSIS — I120 Hypertensive chronic kidney disease with stage 5 chronic kidney disease or end stage renal disease: Secondary | ICD-10-CM

## 2015-08-12 DIAGNOSIS — D631 Anemia in chronic kidney disease: Secondary | ICD-10-CM

## 2015-08-12 DIAGNOSIS — H6121 Impacted cerumen, right ear: Secondary | ICD-10-CM

## 2015-08-12 DIAGNOSIS — N185 Chronic kidney disease, stage 5: Secondary | ICD-10-CM

## 2015-08-12 DIAGNOSIS — I10 Essential (primary) hypertension: Secondary | ICD-10-CM

## 2015-08-12 DIAGNOSIS — N189 Chronic kidney disease, unspecified: Secondary | ICD-10-CM | POA: Diagnosis not present

## 2015-08-12 DIAGNOSIS — E785 Hyperlipidemia, unspecified: Secondary | ICD-10-CM | POA: Diagnosis not present

## 2015-08-12 LAB — CBC WITH DIFFERENTIAL/PLATELET
BASOS ABS: 0 10*3/uL (ref 0.0–0.1)
BASOS PCT: 0.9 % (ref 0.0–3.0)
EOS ABS: 0.1 10*3/uL (ref 0.0–0.7)
Eosinophils Relative: 2.7 % (ref 0.0–5.0)
HCT: 24.5 % — ABNORMAL LOW (ref 36.0–46.0)
Lymphocytes Relative: 15.5 % (ref 12.0–46.0)
Lymphs Abs: 0.8 10*3/uL (ref 0.7–4.0)
MCHC: 32.1 g/dL (ref 30.0–36.0)
MCV: 90.2 fl (ref 78.0–100.0)
MONO ABS: 0.5 10*3/uL (ref 0.1–1.0)
Monocytes Relative: 10.2 % (ref 3.0–12.0)
Neutro Abs: 3.6 10*3/uL (ref 1.4–7.7)
Neutrophils Relative %: 70.7 % (ref 43.0–77.0)
Platelets: 271 10*3/uL (ref 150.0–400.0)
RBC: 2.72 Mil/uL — ABNORMAL LOW (ref 3.87–5.11)
RDW: 17.7 % — AB (ref 11.5–15.5)
WBC: 5 10*3/uL (ref 4.0–10.5)

## 2015-08-12 LAB — LIPID PANEL
CHOLESTEROL: 136 mg/dL (ref 0–200)
HDL: 67.7 mg/dL (ref >39.00)
LDL CALC: 56 mg/dL (ref 0–99)
NonHDL: 68.05
TRIGLYCERIDES: 61 mg/dL (ref 0.0–149.0)
Total CHOL/HDL Ratio: 2
VLDL: 12.2 mg/dL (ref 0.0–40.0)

## 2015-08-12 MED ORDER — ANTIPYRINE-BENZOCAINE 5.4-1.4 % OT SOLN: 3.0000 [drp] | OTIC | Status: AC | PRN

## 2015-08-12 NOTE — Telephone Encounter (Signed)
Lab called with critical Hemoglobin on pt of 7.9. Please advise on what to do.

## 2015-08-12 NOTE — Progress Notes (Signed)
Subjective:    Patient ID: Erika Rivera, female    DOB: 1936/03/27, 79 y.o.   MRN: [id number]  HPI  Patient here for followup Also reviewed chronic medical conditions, interval events and current concerns   Past Medical History  Diagnosis Date  . ARTHRITIS, HAND   . ARTHRITIS, LEFT SHOULDER   . HEARING LOSS   . SMOKER   . CEREBROVASCULAR ACCIDENT, HX OF   . HYPERTENSION   . HYPERLIPIDEMIA   . HYPOTHYROIDISM   . OVERACTIVE BLADDER   . Cerebral aneurysm     s/p clipping  . Polymorphic ventricular tachycardia (HCC)     s/p [employer] AICD (dual chamber) 12/2011;  Echo: mild LVH, EF 50-555, grade 1 diast dysfxn, trivial AI.   Marland Kitchen Torsade de pointes     PVC dependent  . Syncope     2/2 VTach  . Bradycardia     A paced by AICD  . Essential tremor   . Osteopenia 11/18/2012    10/14/12 DEXA @ [facility]: -2.2  . Allergy   . Arthritis   . CAD (coronary artery disease)     LHC 12/2011: RCA 75% (neg FFR), EF 55-65% - med Tx  . Dementia   . Vitamin B 12 deficiency   . Hypothyroidism   . Chronic kidney disease, stage III (moderate)   . High blood pressure with chronic kidney disease   . Stroke [facility])     With rupture of intra-cranial aneurysm  . Polymorphic ventricular tachycardia (HCC)     s/p ICD placement  . Proteinuria   . Lupus ([place])     Pt reports "lupus of skin"    Review of Systems  Constitutional: Positive for fatigue. Negative for unexpected weight change.  HENT: Positive for hearing loss.   Respiratory: Negative for cough and shortness of breath.   Cardiovascular: Negative for chest pain and leg swelling.  Musculoskeletal:       Pain bilateral feet, left greater than right  Neurological: Positive for tremors.       Objective:    Physical Exam  Constitutional:  In wheelchair. 2 daughters at side  HENT:  Once hearing aids removed, impaction right side cerumen. Irrigation as noted below.  Cardiovascular: Normal rate and regular rhythm.   R>L LE edema - 2+    Pulmonary/Chest: Effort normal and breath sounds normal. No respiratory distress.  Neurological: She is alert. No cranial nerve deficit. Coordination normal.  Skin:  Extremely dry scaling skin bilateral lower extremities. Left toe tip with dried blood blister, ongoing nursing care at nursing facility reviewed per family report    BP 124/78 mmHg  Pulse 100  Temp(Src) 97.4 F (36.3 C) (Oral)  Ht 5\' 5"  (1.651 m)  Wt 167 lb 2 oz (75.807 kg)  BMI 27.81 kg/m2  SpO2 90% Wt Readings from Last 3 Encounters:  08/12/15 167 lb 2 oz (75.807 kg)  06/21/15 159 lb (72.122 kg)  05/13/15 161 lb 9.6 oz (73.301 kg)     Lab Results  Component Value Date   WBC 4.6 05/23/2015   HGB 7.3* 05/23/2015   HCT 23.8* 05/23/2015   PLT 228 05/23/2015   GLUCOSE 88 05/23/2015   CHOL 180 09/27/2014   TRIG 104.0 09/27/2014   HDL 76.90 09/27/2014   LDLDIRECT 138.6 04/23/2011   LDLCALC 82 09/27/2014   ALT 8* 04/17/2015   AST 16 04/17/2015   NA 144 05/23/2015   K 3.4* 05/23/2015   CL 114* 05/23/2015  CREATININE 2.62* 05/23/2015   BUN 28* 05/23/2015   CO2 24 05/23/2015   TSH 1.088 04/17/2015   INR 0.99 06/21/2014   HGBA1C 5.1 09/22/2012   MICROALBUR 64.5* 09/22/2013    No results found.  Procedure: wax removal Reason: wax impaction Risks and benefits of procedure discussed with the patient who agrees to proceed. R ear irrigated with warm water. Large amount of wax removed. Instrumentation with plastic ear loop was performed to accomplish wax removal. the patient tolerated procedure well.     Assessment & Plan:   Problem List Items Addressed This Visit    Anemia - Primary    Suspect multifactorial, most recent 2 unit PRBC transfusion 07/23/2015 reviewed Family reports no plans for further transfusion given other direction towards palliative care with other comorbid conditions (progressive renal failure, ovarian mass NOS with ongoing vaginal bleeding not planning aggressive workup given advanced  age with progressive dementia) Recheck CBC at family request and follow-up with renal and oncology as planned      Relevant Orders   CBC with Differential/Platelet   CKD stage 5 secondary to hypertension ([place])    Following with renal - Dr Posey Pronto  Progressive decline per report -  No changes recommended       Relevant Orders   CBC with Differential/Platelet   Essential hypertension    BP Readings from Last 3 Encounters:  08/12/15 124/78  07/23/15 122/70  06/21/15 160/68   Added Bbloc propanlol to ARB/hct 04/2011 for BP control and RUE tremor symptoms  Amlodipine added by cards 03/2012  ARB and HCTZ stopped 03/2015 because of progressive renal decline - now felt to be CKD, but worse with HCTZ reviewed increasing BLE edema off diuretics Encouraged compliance with compression stockings and follow up renal as planned -no changes at this time      Relevant Orders   Lipid panel   Hearing loss    Bilateral hearing aids in place, continued deficits reviewed Irrigation of right side for cerumen impaction performed today       Hyperlipidemia    Hx CVA - prior Noncompliance with statin summer 2012 Improved compliance since - remains on simvastatin at assisted living Check annually, adjust as needed The current medical regimen is effective;  continue present plan and medications until palliative care determined to be primary goal.       Relevant Orders   Lipid panel    Other Visit Diagnoses    Right ear impacted cerumen            Gwendolyn Grant, MD

## 2015-08-12 NOTE — Assessment & Plan Note (Signed)
Suspect multifactorial, most recent 2 unit PRBC transfusion 07/23/2015 reviewed Family reports no plans for further transfusion given other direction towards palliative care with other comorbid conditions (progressive renal failure, ovarian mass NOS with ongoing vaginal bleeding not planning aggressive workup given advanced age with progressive dementia) Recheck CBC at family request and follow-up with renal and oncology as planned

## 2015-08-12 NOTE — Assessment & Plan Note (Signed)
BP Readings from Last 3 Encounters:  08/12/15 124/78  07/23/15 122/70  06/21/15 160/68   Added Bbloc propanlol to ARB/hct 04/2011 for BP control and RUE tremor symptoms  Amlodipine added by cards 03/2012  ARB and HCTZ stopped 03/2015 because of progressive renal decline - now felt to be CKD, but worse with HCTZ reviewed increasing BLE edema off diuretics Encouraged compliance with compression stockings and follow up renal as planned -no changes at this time

## 2015-08-12 NOTE — Assessment & Plan Note (Signed)
Following with renal - Dr Patel  Progressive decline per report -  No changes recommended  

## 2015-08-12 NOTE — Telephone Encounter (Signed)
No treatment is currently necessary based on Dr. Katheren Puller note from 11/7. Information forwarded to Dr. Asa Lente for further consideration if needed.

## 2015-08-12 NOTE — Assessment & Plan Note (Signed)
Bilateral hearing aids in place, continued deficits reviewed Irrigation of right side for cerumen impaction performed today

## 2015-08-12 NOTE — Assessment & Plan Note (Signed)
Hx CVA - prior Noncompliance with statin summer 2012 Improved compliance since - remains on simvastatin at assisted living Check annually, adjust as needed The current medical regimen is effective;  continue present plan and medications until palliative care determined to be primary goal.

## 2015-08-12 NOTE — Patient Instructions (Signed)
It was good to see you today.  We have reviewed your prior records including labs and tests today  Your ear has been irrigated of wax today -let us know if continued hearing problems persist for referral to audiologist and hearing testing  Test(s) ordered today. Your results will be released to [facility] (or called to you) after review, usually within 72hours after test completion. If any changes need to be made, you will be notified at that same time.  Medications reviewed and updated Used new eardrops as needed for ear pain. No other changes recommended at this time.  Please schedule followup in 3-4 months with Dr Quay Burow or other new PCP, call sooner if problems.

## 2015-08-13 NOTE — Telephone Encounter (Signed)
Noted Hgb 7.9.  Pt s/p 2 U PRBC 10/18 following Hgb 6.9 level at Dr. Ena Dawley' office on 10/13. Also note pt has presumed ovarian ca which is felt to be inoperable and family leaning towards comfort only care, told 10/18 PRBC there would be no more transfusions per our visit yesterday.  Please fax my OV note yesterday, lab results, and fax cover to [facility] Dr Forde Dandy where pt is staying with this information.  No other orders needed at this time - see result note on labs for message to family thanks

## 2015-09-04 ENCOUNTER — Encounter: Payer: Self-pay | Admitting: Podiatry

## 2015-09-04 ENCOUNTER — Ambulatory Visit (INDEPENDENT_AMBULATORY_CARE_PROVIDER_SITE_OTHER): Payer: Commercial Managed Care - HMO | Admitting: Podiatry

## 2015-09-04 DIAGNOSIS — B351 Tinea unguium: Secondary | ICD-10-CM | POA: Diagnosis not present

## 2015-09-04 DIAGNOSIS — M79676 Pain in unspecified toe(s): Secondary | ICD-10-CM

## 2015-09-04 NOTE — Patient Instructions (Signed)
Apply triple antibiotic ointment daily to the blood blister in the end of the left big toe and cover with a Band-Aid until healed Apply triple antibiotic ointment and a small Band-Aid to the top of the right great irritated toe area and cover with a Band-Aid until healed

## 2015-09-05 NOTE — Progress Notes (Signed)
Patient ID: Erika Rivera, female   DOB: May 20, 1936, 79 y.o.   MRN: [id number]  Subjective: This patient presents for scheduled visit for debridement of mycotic toenails. Patient's daughters 2 are present in the room requesting this debridement and state that her mother's toenails are reactive and tender with direct pressure. Also patient's daughter concerned about a eschar/blood blister in the distal left hallux like this to evaluated. They deny any active drainage or treatment for this area since the visit of 05/29/2015  Objective: Patient very agitated and over reactive to light pressure. Patient appears confused and daughters answering questions Patient is transferred from wheelchair to treatment table  Vascular: Pitting edema bilaterally DP and PT pulses 1/4 bilaterally  Neurological: Deferred  Dermatological: Eschar keratoses with apparent dried blood, distal left hallux without any surrounding erythema edema. The area was debrided revealing a superficial ulceration 5 mm in diameter with a granular base. There is no surrounding erythema, edema, warmth, malodor Dorsal right hallux is an area friction irritation local erythema approximately 10 mm in diameter without an open lesion  The toenails are brittle, elongated, incurvated, discolored and patient is extremely reactive to direct pressure to these toenails  Assessment: Noninfected superficial skin ulcer distal left hallux most likely associated with friction rub that clinically is not infected Area of friction rub dorsal right hallux that is not obviously clinically infected Symptomatic onychomycoses 6-10  Plan: Debride eschar in Hyperkeratotic tissue distal left hallux and apply Triple Antibiotic ointment and Band-Aid to the area Patient's daughters advised to continue applying triple antibiotic ointment and Band-Aid daily to this area until healed Apply Triple Antibiotic ointment to irritated area dorsal right hallux and  cover with Band-Aid Advised patient's daughter to have patient wear socks or soft slippers and to observe both these areas. If they note was any sudden increase in pain, swelling, drainage presents for further evaluation  The toenails 6-10 were debrided mechanically electrically without any bleeding  Reappoint 3 months

## 2015-09-23 ENCOUNTER — Ambulatory Visit (INDEPENDENT_AMBULATORY_CARE_PROVIDER_SITE_OTHER): Payer: Commercial Managed Care - HMO | Admitting: *Deleted

## 2015-09-23 DIAGNOSIS — I472 Ventricular tachycardia: Secondary | ICD-10-CM | POA: Diagnosis not present

## 2015-09-23 DIAGNOSIS — I4729 Other ventricular tachycardia: Secondary | ICD-10-CM

## 2015-09-23 LAB — CUP PACEART REMOTE DEVICE CHECK
Battery Remaining Longevity: 43 mo
Battery Remaining Percentage: 52 %
Battery Voltage: 2.95 V
Brady Statistic AP VP Percent: 2.4 %
Brady Statistic RA Percent Paced: 69 %
Brady Statistic RV Percent Paced: 2.5 %
HIGH POWER IMPEDANCE MEASURED VALUE: 35 Ohm
HIGH POWER IMPEDANCE MEASURED VALUE: 35 Ohm
Implantable Lead Implant Date: 20130304
Implantable Lead Location: 753860
Lead Channel Impedance Value: 250 Ohm
Lead Channel Impedance Value: 280 Ohm
Lead Channel Sensing Intrinsic Amplitude: 1.5 mV
MDC IDC LEAD IMPLANT DT: 20130304
MDC IDC LEAD LOCATION: 753859
MDC IDC MSMT LEADCHNL RV SENSING INTR AMPL: 5.3 mV
MDC IDC PG SERIAL: 7016049
MDC IDC SESS DTM: 20161219070015
MDC IDC SET LEADCHNL RA PACING AMPLITUDE: 2 V
MDC IDC SET LEADCHNL RV PACING AMPLITUDE: 2.5 V
MDC IDC SET LEADCHNL RV PACING PULSEWIDTH: 0.5 ms
MDC IDC SET LEADCHNL RV SENSING SENSITIVITY: 0.5 mV
MDC IDC STAT BRADY AP VS PERCENT: 76 %
MDC IDC STAT BRADY AS VP PERCENT: 1 %
MDC IDC STAT BRADY AS VS PERCENT: 16 %

## 2015-09-23 NOTE — Progress Notes (Signed)
Remote ICD transmission.   

## 2015-09-26 ENCOUNTER — Encounter: Payer: Self-pay | Admitting: Cardiology

## 2015-10-25 ENCOUNTER — Telehealth: Payer: Self-pay | Admitting: Internal Medicine

## 2015-10-25 NOTE — Telephone Encounter (Signed)
Erika Rivera called and states she will fax order sheet to device clinic this afternoon.  Device clinic fax number given.

## 2015-10-25 NOTE — Telephone Encounter (Addendum)
Spoke with Melodie Bouillon, NP, who advised that Mare Ferrari, NP, has to send order as patient is at [facility].  She states that she will contact Opal Sidles and give her the device clinic contact information.

## 2015-10-25 NOTE — Telephone Encounter (Signed)
Erika Rivera returned call and states that she will forward request for an order to [place], NP, who works with palliative care patients in a SNF.  Gave fax number for order to be sent to.  Advised that I will give to Dr. Rayann Heman to sign when it is received.  Erika Rivera verbalizes understanding and appreciation.

## 2015-10-25 NOTE — Telephone Encounter (Signed)
Called [facility] and spoke with Betal, patient's nurse.  She states that patient transitioned to palliative care on 10/22/15 and the care is being provided by [facility] and [facility].  Will call to request that order to turn off defib be faxed to [place].  Spoke with Verdis Frederickson at [facility].  She states she will speak with a nurse familiar with patient and will advised them to call back.  Gave device clinic phone number.

## 2015-10-25 NOTE — Telephone Encounter (Signed)
Called [place] and she confirmed that she would like her mother's defibrillator turned off.  Riesa Pope that I would call her back when I am able to get an order from the palliative care team.  She verbalizes understanding of instructions.

## 2015-10-25 NOTE — Telephone Encounter (Addendum)
Order faxed to [phone] per Erika Rivera's request.  Fax confirmation received.  Industry rep and Erika Rivera both aware of plan.  Erika Rivera and she is aware of plan.  She denies any additional questions or concerns at this time.

## 2015-10-25 NOTE — Telephone Encounter (Signed)
New message      Daughter states that hospice is being called in to care for her mother.  She want someone to go to [facility] and disconnect her device.  Please call

## 2015-10-25 NOTE — Telephone Encounter (Signed)
LMOVM requesting call back.  Gave device clinic phone number.  Advised patient's daughter in message that I will contact [facility] to get contact information for patient's hospice provider.

## 2015-10-29 ENCOUNTER — Telehealth: Payer: Self-pay | Admitting: Internal Medicine

## 2015-10-29 NOTE — Telephone Encounter (Signed)
Returned patient's daughter's Erika Rivera) call.  Erika Rivera states that despite having discussed and agreed to patient's defibrillator turn off prior to Saturday morning when [employer] rep arrived at [employer], her sister decided against it that morning.  Erika Rivera states her sister "is not in the same place" as she is as far as decision-making for the patient.  Erika Rivera states that they will all have a family meeting this week and someone will be in contact with our office for order later this week.  Riesa Pope that the pacing portion of the patient's device will not be turned off, just the tachy detection and therapies as per Dr. Jackalyn Lombard previous order.  Erika Rivera states she is aware of this, but that her sister does not understand.  She states that hopefully she will understand more after the family meeting.  Erika Rivera states she will be in contact with our office regarding the outcome of this meeting.  She denies any questions or concerns at this time.

## 2015-10-29 NOTE — Telephone Encounter (Signed)
New Message   Pt daughter wants Dr. To call about removing pt device

## 2015-11-04 ENCOUNTER — Telehealth: Payer: Self-pay | Admitting: Internal Medicine

## 2015-11-04 NOTE — Telephone Encounter (Signed)
Spoke to [place] about mother's upcoming appt. I told her that her mother (pt) does not need to keep the appt since she is having her therapies disabled. [name] voiced understanding.   [name] also states that she still needs the order sent [facility] to have the ICD therapies disabled. I told [name] that I will have this done today while Dr.Allred is here. Again, [name] voiced understanding.

## 2015-11-04 NOTE — Telephone Encounter (Signed)
Called to confirm pt's appt for 11-06-15, dtr asking due to calling in [facility] if pt still needs to come in? Can check remotely if needed. Also has questions about getting someone in to turn off therapies- pls call [name]

## 2015-11-05 ENCOUNTER — Telehealth: Payer: Self-pay | Admitting: *Deleted

## 2015-11-05 NOTE — Telephone Encounter (Signed)
Spoke to [employer] about having pt's ICD therapies deactivated. [name] told the [employer] rep that she wanted to be present when the ICD therapies are d/c'd. She agreed to be there today at [place], [employer] notified.

## 2015-11-06 ENCOUNTER — Encounter: Payer: Commercial Managed Care - HMO | Admitting: Internal Medicine

## 2015-11-06 NOTE — Telephone Encounter (Signed)
ICD therapies deactivated by industry per order on 11/05/15.

## 2015-12-04 DEATH — deceased

## 2015-12-10 ENCOUNTER — Telehealth: Payer: Self-pay | Admitting: Internal Medicine

## 2015-12-10 NOTE — Telephone Encounter (Signed)
New message    3-7 pt daughter [name], calling to return device machine mother has passed away please call

## 2015-12-10 NOTE — Telephone Encounter (Signed)
Returned [employer].  Advised that I will order a return kit from [employer].  She requests that kit is mailed to [address], [place], [place] [address].  Erika Rivera denies any questions or concerns at this time and verbalizes appreciation of call.  [name] return kit ordered.  Called Erika Rivera back to make her aware.  She denies additional questions or concerns.

## 2015-12-13 ENCOUNTER — Ambulatory Visit: Payer: Commercial Managed Care - HMO | Admitting: Internal Medicine
